# Patient Record
Sex: Female | Born: 1935 | Race: White | Hispanic: No | Marital: Single | Smoking: Never smoker
Health system: Southern US, Community
[De-identification: ages and names within clinical notes are randomized; demographics above are authoritative.]

## PROBLEM LIST (undated history)

## (undated) DIAGNOSIS — G4710 Hypersomnia, unspecified: Secondary | ICD-10-CM

## (undated) DIAGNOSIS — I5042 Chronic combined systolic (congestive) and diastolic (congestive) heart failure: Secondary | ICD-10-CM

## (undated) DIAGNOSIS — I10 Essential (primary) hypertension: Secondary | ICD-10-CM

## (undated) DIAGNOSIS — I255 Ischemic cardiomyopathy: Secondary | ICD-10-CM

## (undated) DIAGNOSIS — N183 Chronic kidney disease, stage 3 unspecified: Secondary | ICD-10-CM

## (undated) DIAGNOSIS — I5020 Unspecified systolic (congestive) heart failure: Secondary | ICD-10-CM

## (undated) DIAGNOSIS — H8110 Benign paroxysmal vertigo, unspecified ear: Secondary | ICD-10-CM

## (undated) DIAGNOSIS — N3281 Overactive bladder: Secondary | ICD-10-CM

## (undated) DIAGNOSIS — D519 Vitamin B12 deficiency anemia, unspecified: Secondary | ICD-10-CM

## (undated) DIAGNOSIS — M1990 Unspecified osteoarthritis, unspecified site: Secondary | ICD-10-CM

## (undated) DIAGNOSIS — G4730 Sleep apnea, unspecified: Secondary | ICD-10-CM

## (undated) DIAGNOSIS — I2510 Atherosclerotic heart disease of native coronary artery without angina pectoris: Secondary | ICD-10-CM

## (undated) DIAGNOSIS — H3522 Other non-diabetic proliferative retinopathy, left eye: Secondary | ICD-10-CM

## (undated) DIAGNOSIS — E785 Hyperlipidemia, unspecified: Secondary | ICD-10-CM

## (undated) DIAGNOSIS — E039 Hypothyroidism, unspecified: Secondary | ICD-10-CM

## (undated) DIAGNOSIS — M4800 Spinal stenosis, site unspecified: Secondary | ICD-10-CM

## (undated) DIAGNOSIS — E559 Vitamin D deficiency, unspecified: Secondary | ICD-10-CM

## (undated) DIAGNOSIS — K219 Gastro-esophageal reflux disease without esophagitis: Secondary | ICD-10-CM

## (undated) HISTORY — PX: STAPEDECTOMY: SHX2435

## (undated) HISTORY — PX: CATARACT EXTRACTION, BILATERAL: SHX1313

## (undated) HISTORY — DX: Chronic combined systolic (congestive) and diastolic (congestive) heart failure: I50.42

## (undated) HISTORY — DX: Chronic kidney disease, stage 3 unspecified: N18.30

## (undated) HISTORY — PX: COLON SURGERY: SHX602

## (undated) HISTORY — PX: JOINT REPLACEMENT: SHX530

## (undated) HISTORY — DX: Ischemic cardiomyopathy: I25.5

## (undated) HISTORY — PX: COLECTOMY: SHX59

## (undated) HISTORY — PX: LUMBAR DISC SURGERY: SHX700

## (undated) HISTORY — PX: OTHER SURGICAL HISTORY: SHX169

## (undated) HISTORY — PX: COLONOSCOPY: SHX174

## (undated) HISTORY — DX: Chronic kidney disease, stage 3 (moderate): N18.3

## (undated) HISTORY — DX: Atherosclerotic heart disease of native coronary artery without angina pectoris: I25.10

---

## 2009-12-20 ENCOUNTER — Emergency Department (HOSPITAL_BASED_OUTPATIENT_CLINIC_OR_DEPARTMENT_OTHER): Admission: EM | Admit: 2009-12-20 | Discharge: 2009-12-20 | Payer: Self-pay | Admitting: Emergency Medicine

## 2010-04-07 LAB — MAGNESIUM: Magnesium: 2.1 mg/dL (ref 1.5–2.5)

## 2010-04-07 LAB — DIFFERENTIAL
Eosinophils Absolute: 0.2 10*3/uL (ref 0.0–0.7)
Lymphocytes Relative: 36 % (ref 12–46)
Lymphs Abs: 2.2 10*3/uL (ref 0.7–4.0)
Neutro Abs: 3.1 10*3/uL (ref 1.7–7.7)
Neutrophils Relative %: 52 % (ref 43–77)

## 2010-04-07 LAB — COMPREHENSIVE METABOLIC PANEL
ALT: 27 U/L (ref 0–35)
BUN: 24 mg/dL — ABNORMAL HIGH (ref 6–23)
CO2: 27 mEq/L (ref 19–32)
Calcium: 9.6 mg/dL (ref 8.4–10.5)
Creatinine, Ser: 0.8 mg/dL (ref 0.4–1.2)
GFR calc non Af Amer: >60 mL/min (ref >60)
Glucose, Bld: 86 mg/dL (ref 70–99)

## 2010-04-07 LAB — URINALYSIS, ROUTINE W REFLEX MICROSCOPIC
Glucose, UA: NEGATIVE mg/dL
Protein, ur: NEGATIVE mg/dL
Specific Gravity, Urine: 1.027 (ref 1.005–1.030)
Urobilinogen, UA: 0.2 mg/dL (ref 0.0–1.0)

## 2010-04-07 LAB — CBC
HCT: 36.5 % (ref 36.0–46.0)
Hemoglobin: 12.6 g/dL (ref 12.0–15.0)
MCH: 32.2 pg (ref 26.0–34.0)
MCHC: 34.6 g/dL (ref 30.0–36.0)
RDW: 13 % (ref 11.5–15.5)

## 2010-04-07 LAB — URINE MICROSCOPIC-ADD ON

## 2010-04-07 LAB — TSH: TSH: 2.283 u[IU]/mL (ref 0.350–4.500)

## 2010-12-25 ENCOUNTER — Telehealth: Payer: Self-pay

## 2010-12-25 NOTE — Telephone Encounter (Signed)
Called and spoke with pt, Dr. Macario Carls.  She states she would like to set up an allergy consult with Dr. Maple Hudson ASAP.  She states she has had "allergies all her life."  Has previously seen Dr. Jordan Likes at the clinic in [place].  Pt is wanting to know two things before setting up appt:  1) do we do "end point testing"  2) can she self administer her injections at home since she lives in [place].    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  CY, please advise.   Thank you.

## 2010-12-25 NOTE — Telephone Encounter (Signed)
Per CY-NOT "end point testing technique" and NO to self admin.

## 2010-12-25 NOTE — Telephone Encounter (Signed)
Spoke with patient-she lives at [facility]-they have a round the clock nurse and was wondering if they assumed responsibility to administer allergy injections if [facility] would agree to allow this. Bailey Duke states if they are willing to do this then this is an option. I would have to call the care center on Monday and find out from a manager of the center how this would work; then call the patient back asap.

## 2011-01-01 ENCOUNTER — Ambulatory Visit (INDEPENDENT_AMBULATORY_CARE_PROVIDER_SITE_OTHER): Payer: Medicare Other | Admitting: Internal Medicine

## 2011-01-01 ENCOUNTER — Other Ambulatory Visit: Payer: Medicare Other

## 2011-01-01 ENCOUNTER — Encounter: Payer: Self-pay | Admitting: Internal Medicine

## 2011-01-01 VITALS — BP 152/72 | HR 94 | Ht 64.0 in | Wt 184.0 lb

## 2011-01-01 DIAGNOSIS — J309 Allergic rhinitis, unspecified: Secondary | ICD-10-CM

## 2011-01-01 DIAGNOSIS — H1045 Other chronic allergic conjunctivitis: Secondary | ICD-10-CM

## 2011-01-01 DIAGNOSIS — J301 Allergic rhinitis due to pollen: Secondary | ICD-10-CM

## 2011-01-01 DIAGNOSIS — E039 Hypothyroidism, unspecified: Secondary | ICD-10-CM

## 2011-01-01 DIAGNOSIS — G4733 Obstructive sleep apnea (adult) (pediatric): Secondary | ICD-10-CM

## 2011-01-01 DIAGNOSIS — H1010 Acute atopic conjunctivitis, unspecified eye: Secondary | ICD-10-CM

## 2011-01-01 NOTE — Patient Instructions (Signed)
Consider the techniques for rinsing your nasal passages with saline as needed  Order- lab- IgE allergy profile    Dx allergic rhinitis  Schedule return for allergy skin testing. Antihistamines directly block these tests, so you will need to be off Xyzal and any otc cough or cold preparations, otc sleep meds etc, that often contain antihistamines, FOR AT LEAST 3 DAYS

## 2011-01-01 NOTE — Progress Notes (Signed)
01/01/11- 46 yoF never smoker who comes at the recommendation of friends seeking allergy evaluation. She has been a Holiday representative, never smoked. Began allergy vaccine at age 77 because she was having swelling around her eyes, while living in [place]. She has been on allergy shots much of her adult life and feels she should restart. She has lived in [place] for a number of years now. Recognize triggers include dust, smoke, fragrance, mold, cat and dog. She associates these exposures with repeated colds. In the summer and fall her eyes water. 3 episodes of laryngitis recently. Years ago she says she was cured of asthma by prayer. Her allergy vaccine was stopped by her allergist 5 years ago. Within a year she had developed laryngitis. She had gone to an alternative he'll or who tried sublingual vaccine which didn't seem to help. Complains of a feeling of mucus in her throat and seasonal lacrimation. Denies significant itching or wheezing, rash or nodes. 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 Diagnosed with sleep apnea with CPAP prescribed but not used regularly.  ROS-see HPI Constitutional:   No-   weight loss, night sweats, fevers, chills, fatigue, lassitude. HEENT:   No-  headaches, difficulty swallowing, tooth/dental problems, sore throat,       +  sneezing, itching, ear ache, nasal congestion, post nasal drip,  CV:  No-   chest pain, orthopnea, PND, swelling in lower extremities, anasarca,                                  dizziness, palpitations Resp: No-   shortness of breath with exertion or at rest.              No-   productive cough,  No non-productive cough,  No- coughing up of blood.              No-   change in color of mucus.  No- wheezing.   Skin: +  rash on wrist. GI:  No-   heartburn, indigestion, abdominal pain, nausea, vomiting, diarrhea,                 change in bowel habits, loss of appetite GU: No-   dysuria, change in color of urine, no urgency or  frequency.  No- flank pain. MS:  No-   joint pain or swelling.  No- decreased range of motion.  No- back pain. Neuro-     nothing unusual Psych:  No- change in mood or affect. No depression or anxiety.  No memory loss.  OBJ General- Alert, Oriented, Affect-appropriate, Distress- none acute, jovial Skin- rash-none, lesions- none, excoriation- none Lymphadenopathy- none Head- atraumatic            Eyes- Gross vision intact, PERRLA, conjunctivae clear secretions            Ears- Hearing, canals-normal            Nose- crusting, no-Septal dev, mucus, polyps, erosion, perforation             Throat- Mallampati III, mucosa clear , drainage- none, tonsils- atrophic Neck- flexible , trachea midline, no stridor , thyroid nl, carotid no bruit Chest - symmetrical excursion , unlabored           Heart/CV- RRR , no murmur , no gallop  , no rub, nl s1 s2                           -  JVD- none , edema- none, stasis changes- none, varices- none           Lung- clear to P&A, wheeze- none, cough- none , dullness-none, rub- none           Chest wall-  Abd- tender-no, distended-no, bowel sounds-present, HSM- no Br/ Gen/ Rectal- Not done, not indicated Extrem- cyanosis- none, clubbing, none, atrophy- none, strength- nl Neuro- grossly intact to observation

## 2011-01-01 NOTE — Telephone Encounter (Signed)
I spoke with patient on 12-31-10; made appt for 01-01-11 to see CY as [place] nurse [facility]) will assume responsibility of giving allergy injection. Her fax number is [phone] and phone is [phone]. CY is aware of this. Pt is scheduled for ALT in Feb. Nurse is at [facility] from M-F 9am to 3pm.

## 2011-01-04 DIAGNOSIS — J301 Allergic rhinitis due to pollen: Secondary | ICD-10-CM | POA: Insufficient documentation

## 2011-01-04 DIAGNOSIS — H1010 Acute atopic conjunctivitis, unspecified eye: Secondary | ICD-10-CM | POA: Insufficient documentation

## 2011-01-04 DIAGNOSIS — E039 Hypothyroidism, unspecified: Secondary | ICD-10-CM | POA: Insufficient documentation

## 2011-01-04 DIAGNOSIS — G4733 Obstructive sleep apnea (adult) (pediatric): Secondary | ICD-10-CM | POA: Insufficient documentation

## 2011-01-04 LAB — ALLERGY FULL PROFILE
Allergen, D pternoyssinus,d7: <0.1 kU/L (ref <0.35)
Alternaria Alternata: <0.1 kU/L (ref <0.35)
Bahia Grass: <0.1 kU/L (ref <0.35)
Box Elder IgE: <0.1 kU/L (ref <0.35)
Cat Dander: <0.1 kU/L (ref <0.35)
Common Ragweed: <0.1 kU/L (ref <0.35)
Curvularia lunata: <0.1 kU/L (ref <0.35)
Elm IgE: <0.1 kU/L (ref <0.35)
Fescue: <0.1 kU/L (ref <0.35)
G009 Red Top: <0.1 kU/L (ref <0.35)
House Dust Hollister: <0.1 kU/L (ref <0.35)
Lamb's Quarters: <0.1 kU/L (ref <0.35)
Sycamore Tree: <0.1 kU/L (ref <0.35)

## 2011-01-04 NOTE — Assessment & Plan Note (Addendum)
Long history of allergy vaccine, implying she has had positive skin testing at times. Her symptoms are incompletely defined now but suggest possibility of irritant rhinitis and viral infections. Chest the both in vitro and the skin testing assessments be done. We discussed where they're similar and where they are not.

## 2011-01-04 NOTE — Assessment & Plan Note (Signed)
Diagnosed sleep apnea and has home CPAP but does not seem to be using regularly. I educated her on goals of effective use.

## 2011-01-08 ENCOUNTER — Telehealth: Payer: Self-pay | Admitting: Internal Medicine

## 2011-01-08 NOTE — Telephone Encounter (Signed)
Called and spoke with pt and she is aware of CY recs and the names of the doctors and number.

## 2011-01-08 NOTE — Telephone Encounter (Signed)
Called and spoke with pt and i did review her lab results with her.  Pt voiced her understanding of this.  Pt is requesting the name of the first ophthalmologist that CY recs at the OV yesterday (she does remember Dr. Dione Booze but Zack Seal told her the name of the doctor he see's) and she can not remember the other name. .  She is also requesting if CY knows of a good otolaryngologist that she can call to get an appt with .  CY please advise

## 2011-01-08 NOTE — Telephone Encounter (Signed)
Per CY-OPTH-Hecker or Ashley Royalty and ENT-Wolicki or one in his group.

## 2011-03-03 ENCOUNTER — Ambulatory Visit: Payer: Medicare Other | Admitting: Internal Medicine

## 2013-01-24 ENCOUNTER — Emergency Department (HOSPITAL_COMMUNITY): Payer: Medicare Other

## 2013-01-24 ENCOUNTER — Encounter (HOSPITAL_COMMUNITY): Payer: Self-pay | Admitting: Emergency Medicine

## 2013-01-24 ENCOUNTER — Observation Stay (HOSPITAL_COMMUNITY)
Admission: EM | Admit: 2013-01-24 | Discharge: 2013-01-27 | Disposition: A | Discharge status: Skilled Nursing Facility | Payer: Medicare Other | Attending: Family Medicine | Admitting: Family Medicine

## 2013-01-24 DIAGNOSIS — Z96659 Presence of unspecified artificial knee joint: Secondary | ICD-10-CM | POA: Insufficient documentation

## 2013-01-24 DIAGNOSIS — M47817 Spondylosis without myelopathy or radiculopathy, lumbosacral region: Secondary | ICD-10-CM | POA: Insufficient documentation

## 2013-01-24 DIAGNOSIS — I10 Essential (primary) hypertension: Secondary | ICD-10-CM | POA: Diagnosis present

## 2013-01-24 DIAGNOSIS — Z7982 Long term (current) use of aspirin: Secondary | ICD-10-CM | POA: Insufficient documentation

## 2013-01-24 DIAGNOSIS — F411 Generalized anxiety disorder: Secondary | ICD-10-CM | POA: Insufficient documentation

## 2013-01-24 DIAGNOSIS — H1010 Acute atopic conjunctivitis, unspecified eye: Secondary | ICD-10-CM | POA: Diagnosis present

## 2013-01-24 DIAGNOSIS — K449 Diaphragmatic hernia without obstruction or gangrene: Secondary | ICD-10-CM | POA: Insufficient documentation

## 2013-01-24 DIAGNOSIS — E039 Hypothyroidism, unspecified: Secondary | ICD-10-CM | POA: Diagnosis present

## 2013-01-24 DIAGNOSIS — F0390 Unspecified dementia without behavioral disturbance: Secondary | ICD-10-CM | POA: Insufficient documentation

## 2013-01-24 DIAGNOSIS — F3289 Other specified depressive episodes: Secondary | ICD-10-CM | POA: Insufficient documentation

## 2013-01-24 DIAGNOSIS — F32A Depression, unspecified: Secondary | ICD-10-CM | POA: Diagnosis present

## 2013-01-24 DIAGNOSIS — G459 Transient cerebral ischemic attack, unspecified: Principal | ICD-10-CM | POA: Diagnosis present

## 2013-01-24 DIAGNOSIS — F329 Major depressive disorder, single episode, unspecified: Secondary | ICD-10-CM

## 2013-01-24 DIAGNOSIS — F419 Anxiety disorder, unspecified: Secondary | ICD-10-CM | POA: Diagnosis present

## 2013-01-24 DIAGNOSIS — G4733 Obstructive sleep apnea (adult) (pediatric): Secondary | ICD-10-CM | POA: Diagnosis present

## 2013-01-24 DIAGNOSIS — K219 Gastro-esophageal reflux disease without esophagitis: Secondary | ICD-10-CM | POA: Diagnosis present

## 2013-01-24 DIAGNOSIS — J301 Allergic rhinitis due to pollen: Secondary | ICD-10-CM | POA: Diagnosis present

## 2013-01-24 DIAGNOSIS — M4800 Spinal stenosis, site unspecified: Secondary | ICD-10-CM | POA: Insufficient documentation

## 2013-01-24 DIAGNOSIS — Z79899 Other long term (current) drug therapy: Secondary | ICD-10-CM | POA: Insufficient documentation

## 2013-01-24 DIAGNOSIS — H1045 Other chronic allergic conjunctivitis: Secondary | ICD-10-CM | POA: Insufficient documentation

## 2013-01-24 DIAGNOSIS — M25569 Pain in unspecified knee: Secondary | ICD-10-CM | POA: Insufficient documentation

## 2013-01-24 HISTORY — DX: Gastro-esophageal reflux disease without esophagitis: K21.9

## 2013-01-24 HISTORY — DX: Spinal stenosis, site unspecified: M48.00

## 2013-01-24 HISTORY — DX: Sleep apnea, unspecified: G47.30

## 2013-01-24 HISTORY — DX: Essential (primary) hypertension: I10

## 2013-01-24 LAB — COMPREHENSIVE METABOLIC PANEL
ALT: 16 U/L (ref 0–35)
Alkaline Phosphatase: 90 U/L (ref 39–117)
CO2: 26 mEq/L (ref 19–32)
GFR calc Af Amer: 54 mL/min — ABNORMAL LOW (ref >90)
GFR calc non Af Amer: 47 mL/min — ABNORMAL LOW (ref >90)
Glucose, Bld: 106 mg/dL — ABNORMAL HIGH (ref 70–99)
Potassium: 4.5 mEq/L (ref 3.7–5.3)
Sodium: 140 mEq/L (ref 137–147)

## 2013-01-24 LAB — URINALYSIS, ROUTINE W REFLEX MICROSCOPIC
Glucose, UA: NEGATIVE mg/dL
Hgb urine dipstick: NEGATIVE
Specific Gravity, Urine: 1.016 (ref 1.005–1.030)

## 2013-01-24 LAB — GLUCOSE, CAPILLARY: Glucose-Capillary: 95 mg/dL (ref 70–99)

## 2013-01-24 LAB — URINE MICROSCOPIC-ADD ON

## 2013-01-24 LAB — CBC
HCT: 36.7 % (ref 36.0–46.0)
MCHC: 33 g/dL (ref 30.0–36.0)
RDW: 13.6 % (ref 11.5–15.5)

## 2013-01-24 MED ORDER — ASPIRIN 325 MG PO TABS
325.0000 mg | ORAL_TABLET | Freq: Once | ORAL | Status: AC
  Administered 2013-01-24: 325 mg via ORAL
  Filled 2013-01-24: qty 1

## 2013-01-24 NOTE — ED Notes (Signed)
Admitting MD at bedside, MD reynolds at bedside.

## 2013-01-24 NOTE — ED Notes (Addendum)
Spoke with Leavy Cella, Charity fundraiser at [facility]. She reports that pt lives in independent living at [facility] but was placed in the rehab facility 1 month ago due to fall, head injury and altered mental status. Pt was discharged from rehab 1 week ago and told to follow up with a neurologist for MRI. Pt did not have MRI completed. Pt was brought back to rehab facility last night (possibly by in-home caregivers) due to being "out of it" and decreased expression. Today pt's mental status improved but when pt was seen by staff at rehab (possibly MD or NP) orders were placed for pt to leave EMS for MRI.   [facility] RN phone number [phone].

## 2013-01-24 NOTE — ED Notes (Signed)
Per EMS pt came from [facility] assisted living facility for altered mental status. Pt has hx of dementia and confusion has increased over the past 3 months per staff. Pt's doctor recommended she be seen for MRI and pt was sent here. Pt AAOx4. VSS.

## 2013-01-24 NOTE — ED Provider Notes (Signed)
CSN: [id number]     Arrival date & time 01/24/13  1830 History   First MD Initiated Contact with Patient 01/24/13 1834     Chief Complaint  Patient presents with  . Altered Mental Status    HPI Patient reports word finding difficulty yesterday.  She states it was very severe.  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  She denies weakness of her arms or legs.  She did feels that her speech was possibly a little thickened.  She denies facial droop.  No prior history of stroke.  She does have a history of hypertension.  Symptoms were mild to moderate in severity.  She states that lasted for several hours yesterday.  She states that her word finding difficulties or significantly improved at this point she feels 95% back to normal.  No chest pain or shortness of breath    Past Medical History  Diagnosis Date  . Hypertension   . Sleep apnea   . Spinal stenosis   . Depressive disorder   . Anxiety   . Diaphragmatic hernia   . Dementia   . GERD (gastroesophageal reflux disease)    Past Surgical History  Procedure Laterality Date  . Joint replacement     No family history on file. History  Substance Use Topics  . Smoking status: Never Smoker   . Smokeless tobacco: Not on file  . Alcohol Use: 1.2 oz/week    2 Shots of liquor per week   OB History   Grav Para Term Preterm Abortions TAB SAB Ect Mult Living                 Review of Systems  All other systems reviewed and are negative.    Allergies  Penicillins and Sulfa antibiotics  Home Medications   Current Outpatient Rx  Name  Route  Sig  Dispense  Refill  . acetaminophen (TYLENOL) 325 MG tablet   Oral   Take 650 mg by mouth every 4 (four) hours as needed for mild pain or fever.         Marland Kitchen amLODipine (NORVASC) 5 MG tablet   Oral   Take 5 mg by mouth every morning.         . Armodafinil (NUVIGIL) 250 MG tablet   Oral   Take 250 mg by mouth every morning.         .  camphor-menthol (SARNA) lotion   Topical   Apply 1 application topically 2 (two) times daily as needed for itching.         . carvedilol (COREG) 6.25 MG tablet   Oral   Take 6.25 mg by mouth 2 (two) times daily with a meal.         . cetirizine (ZYRTEC) 10 MG tablet   Oral   Take 10 mg by mouth daily as needed for allergies or rhinitis.         . Cholecalciferol (VITAMIN D) 2000 UNITS CAPS   Oral   Take 2,000 Units by mouth every morning.         . cyanocobalamin (,VITAMIN B-12,) 1000 MCG/ML injection   Intramuscular   Inject 1,000 mcg into the muscle every 30 (thirty) days.         . diclofenac sodium (VOLTAREN) 1 % GEL   Topical   Apply 4 g topically every 6 (six) hours as needed (for lumbosacral spondylosis).         Marland Kitchen  HYDROcodone-acetaminophen (NORCO/VICODIN) 5-325 MG per tablet   Oral   Take 1 tablet by mouth every 6 (six) hours as needed for moderate pain.         Marland Kitchen levothyroxine (SYNTHROID, LEVOTHROID) 100 MCG tablet   Oral   Take 100 mcg by mouth daily before breakfast.         . Liniments (PENETRAN PLUS EX)   Apply externally   Apply 1 application topically 3 (three) times daily as needed (for joint pain).         Marland Kitchen losartan (COZAAR) 100 MG tablet   Oral   Take 100 mg by mouth every morning.         . magnesium oxide (MAG-OX) 400 MG tablet   Oral   Take 400 mg by mouth at bedtime.         . meclizine (ANTIVERT) 25 MG tablet   Oral   Take 25 mg by mouth 3 (three) times daily as needed for dizziness.         . Menthol, Topical Analgesic, (BIOFREEZE) 4 % GEL   Apply externally   Apply 1 application topically 3 (three) times daily as needed (for joint pain).         . Multiple Vitamins-Minerals (MULTIVITAMIN PO)   Oral   Take 1 tablet by mouth every morning.         Marland Kitchen omeprazole (PRILOSEC) 20 MG capsule   Oral   Take 20 mg by mouth daily.         Marland Kitchen OVER THE COUNTER MEDICATION   Oral   Take 30 mLs by mouth at bedtime.  *alcohol liquid*         . oxybutynin (DITROPAN-XL) 5 MG 24 hr tablet   Oral   Take 5 mg by mouth at bedtime.         . Soybean Lecithin (PHOSPHATIDYLSERINE) 40 % POWD   Oral   Take 500 mg by mouth 3 (three) times daily.         . traMADol (ULTRAM) 50 MG tablet   Oral   Take 50 mg by mouth 2 (two) times daily.         . Turmeric, Curcuma Longa, (CURCUMIN EXTRACT) POWD   Does not apply   1 application by Does not apply route 3 (three) times daily.         . vitamin C (ASCORBIC ACID) 500 MG tablet   Oral   Take 1,000 mg by mouth every morning.          BP 180/77  Temp(Src) 98 F (36.7 C) (Oral)  Resp 16  SpO2 96% Physical Exam  Nursing note and vitals reviewed. Constitutional: She is oriented to person, place, and time. She appears well-developed and well-nourished. No distress.  HENT:  Head: Normocephalic and atraumatic.  Eyes: EOM are normal. Pupils are equal, round, and reactive to light.  Neck: Normal range of motion.  Cardiovascular: Normal rate, regular rhythm and normal heart sounds.   Pulmonary/Chest: Effort normal and breath sounds normal.  Abdominal: Soft. She exhibits no distension. There is no tenderness.  Musculoskeletal: Normal range of motion.  Neurological: She is alert and oriented to person, place, and time.  5/5 strength in major muscle groups of  bilateral upper and lower extremities. Speech normal. No facial asymetry.   Skin: Skin is warm and dry.  Psychiatric: She has a normal mood and affect. Judgment normal.    ED Course  Procedures (including critical care time)  Labs Review Labs Reviewed  URINALYSIS, ROUTINE W REFLEX MICROSCOPIC - Abnormal; Notable for the following:    APPearance HAZY (*)    Leukocytes, UA SMALL (*)    All other components within normal limits  COMPREHENSIVE METABOLIC PANEL - Abnormal; Notable for the following:    Glucose, Bld 106 (*)    BUN 26 (*)    Albumin 3.4 (*)    Total Bilirubin <0.2 (*)    GFR calc  non Af Amer 47 (*)    GFR calc Af Amer 54 (*)    All other components within normal limits  URINE MICROSCOPIC-ADD ON - Abnormal; Notable for the following:    Bacteria, UA FEW (*)    All other components within normal limits  URINE CULTURE  CBC  GLUCOSE, CAPILLARY   Imaging Review Ct Head Wo Contrast  01/24/2013   CLINICAL DATA:  Altered mental status. Dementia. Worsening confusion.  EXAM: CT HEAD WITHOUT CONTRAST  TECHNIQUE: Contiguous axial images were obtained from the base of the skull through the vertex without intravenous contrast.  COMPARISON:  02/24/2012 MRI report only  FINDINGS: Sinuses/Soft tissues: Clear paranasal sinuses and mastoid air cells.  Intracranial: No mass lesion, hemorrhage, hydrocephalus, acute infarct, intra-axial, or extra-axial fluid collection. Mild-to-moderate low density in the periventricular white matter likely related to small vessel disease.  IMPRESSION: No acute intracranial abnormality.  Small vessel ischemic change.   Electronically Signed   By: Jeronimo Greaves M.D.   On: 01/24/2013 21:13  I personally reviewed the imaging tests through PACS system I reviewed available ER/hospitalization records through the EMR   EKG Interpretation   None       MDM   1. TIA (transient ischemic attack)    Concerning for TIA or quickly resolving stroke.  Swallow study.  Aspirin.  Head CT without abnormality.  Patient is feeling better.  Neurology consultation.  Hospitalist admission.  I think she'll benefit from TIA/stroke workup including MRI    Lyanne Co, MD 01/24/13 2304

## 2013-01-24 NOTE — Consult Note (Signed)
Referring Physician: Patria Mane    Chief Complaint: Difficulty with speech  HPI: Bailey Duke is an 79 y.o. female that reports that she was at baseline until yesterday around lunchtime.  She went out to speak to some workers in the yard. Initially she was able to speak well but as she got in to the conversation she was unable to speak fluently.  She was able to finish the conversation to some degree and came in to lie down at that time.  When she awakened she went to dinner and seemed to do well speaking at that time.  When she awakened this morning it seems that she was continuing to have some problems but improved from yesterday.  She was convinced by others to come to the hospital. At baseline patient drives and takes care of her own bills.  She does not cook or clean and has a Diplomatic Services operational officer that comes in 5 days a week.    Date last known well: Date: 01/23/2013 Time last known well: Time: 12:00 tPA Given: No: Outside time window  Past Medical History  Diagnosis Date  . Hypertension   . Sleep apnea   . Spinal stenosis   . Depressive disorder   . Anxiety   . Diaphragmatic hernia   . Dementia   . GERD (gastroesophageal reflux disease)     Past Surgical History  Procedure Laterality Date  . Joint replacement      Family History  Problem Relation Age of Onset  . Breast cancer Mother   . Other Father     Hardening of the arteries to the head   Social History:  reports that she has never smoked. She does not have any smokeless tobacco history on file. She reports that she drinks about 1.2 ounces of alcohol per week. She reports that she does not use illicit drugs.  Allergies:  Allergies  Allergen Reactions  . Penicillins   . Sulfa Antibiotics     Medications: I have reviewed the patient's current medications. Prior to Admission:  Current outpatient prescriptions: acetaminophen (TYLENOL) 325 MG tablet, Take 650 mg by mouth every 4 (four) hours as needed for mild pain or fever., Disp:  , Rfl: amLODipine (NORVASC) 5 MG tablet, Take 5 mg by mouth every morning., Disp: , Rfl: ;   Armodafinil (NUVIGIL) 250 MG tablet, Take 250 mg by mouth every morning., Disp: , Rfl:  camphor-menthol (SARNA) lotion, Apply 1 application topically 2 (two) times daily as needed for itching., Disp: , Rfl: ;   carvedilol (COREG) 6.25 MG tablet, Take 6.25 mg by mouth 2 (two) times daily with a meal., Disp: , Rfl: ;   cetirizine (ZYRTEC) 10 MG tablet, Take 10 mg by mouth daily as needed for allergies or rhinitis., Disp: , Rfl: ;   Cholecalciferol (VITAMIN D) 2000 UNITS CAPS, Take 2,000 Units by mouth every morning., Disp: , Rfl:  cyanocobalamin (,VITAMIN B-12,) 1000 MCG/ML injection, Inject 1,000 mcg into the muscle every 30 (thirty) days., Disp: , Rfl: ;  diclofenac sodium (VOLTAREN) 1 % GEL, Apply 4 g topically every 6 (six) hours as needed (for lumbosacral spondylosis)., Disp: , HYDROcodone-acetaminophen (NORCO/VICODIN) 5-325 MG per tablet, Take 1 tablet by mouth every 6 (six) hours as needed for moderate pain., Disp: , Rfl:  levothyroxine (SYNTHROID, LEVOTHROID) 100 MCG tablet, Take 100 mcg by mouth daily before breakfast., Disp: , Rfl: ;  Liniments (PENETRAN PLUS EX), Apply 1 application topically 3 (three) times daily as needed (for joint pain)., Disp: , Rfl: ;  losartan (COZAAR) 100 MG tablet, Take 100 mg by mouth every morning., Disp: , Rfl: ;   magnesium oxide (MAG-OX) 400 MG tablet, Take 400 mg by mouth at bedtime., Disp: , Rfl:  meclizine (ANTIVERT) 25 MG tablet, Take 25 mg by mouth 3 (three) times daily as needed for dizziness., Disp: , Rfl: ;   Menthol, Topical Analgesic, (BIOFREEZE) 4 % GEL, Apply 1 application topically 3 (three) times daily as needed (for joint pain)., Multiple Vitamins-Minerals (MULTIVITAMIN PO), Take 1 tablet by mouth every morning., Disp: , Rfl: ;   omeprazole (PRILOSEC) 20 MG capsule, Take 20 mg by mouth daily., Disp: , Rfl:  OVER THE COUNTER MEDICATION, Take 30 mLs by mouth  at bedtime. *alcohol liquid*, Disp: , Rfl: ;   oxybutynin (DITROPAN-XL) 5 MG 24 hr tablet, Take 5 mg by mouth at bedtime., Disp: , Rfl: ;   Soybean Lecithin (PHOSPHATIDYLSERINE) 40 % POWD, Take 500 mg by mouth 3 (three) times daily., Disp: , Rfl: ;   traMADol (ULTRAM) 50 MG tablet, Take 50 mg by mouth 2 (two) times daily., Disp: , Rfl:  Turmeric, Curcuma Longa, (CURCUMIN EXTRACT) POWD, 1 application by Does not apply route 3 (three) times daily., Disp: , Rfl: vitamin C (ASCORBIC ACID) 500 MG tablet, Take 1,000 mg by mouth every morning., Disp: , Rfl:   ROS: History obtained from the patient  General ROS: negative for - chills, fatigue, fever, night sweats, weight gain or weight loss Psychological ROS: negative for - behavioral disorder, hallucinations, memory difficulties, mood swings or suicidal ideation Ophthalmic ROS: negative for - blurry vision, double vision, eye pain or loss of vision ENT ROS: negative for - epistaxis, nasal discharge, oral lesions, sore throat, tinnitus or vertigo Allergy and Immunology ROS: negative for - hives or itchy/watery eyes Hematological and Lymphatic ROS: negative for - bleeding problems, bruising or swollen lymph nodes Endocrine ROS: negative for - galactorrhea, hair pattern changes, polydipsia/polyuria or temperature intolerance Respiratory ROS: shortness of breath Cardiovascular ROS: negative for - chest pain, dyspnea on exertion, edema or irregular heartbeat Gastrointestinal ROS: recent diarrhea Genito-Urinary ROS: negative for - dysuria, hematuria, incontinence or urinary frequency/urgency Musculoskeletal ROS: negative for - joint swelling or muscular weakness Neurological ROS: as noted in HPI Dermatological ROS: negative for rash and skin lesion changes  Physical Examination: Blood pressure 171/93, pulse 86, temperature 98 F (36.7 C), temperature source Oral, resp. rate 16, SpO2 93.00%.  Neurologic Examination: Mental Status: Alert, oriented,  thought content appropriate.  Speech fluent but at times patient would just stop talking and lose track of what she was speaking of.  Reports she was unable to think of words to say.  Would then restart conversation.  Tangential.  Able to follow 3 step commands without difficulty. Cranial Nerves: II: Discs flat bilaterally; Visual fields grossly normal.  Left pupil 2mm, right pupil 55mm-both unreactive. III,IV, VI: left ptosis, extra-ocular motions intact bilaterally V,VII: smile symmetric, facial light touch sensation normal bilaterally VIII: hearing normal bilaterally IX,X: gag reflex present XI: bilateral shoulder shrug XII: midline tongue extension Motor: Right : Upper extremity   5/5    Left:     Upper extremity   5/5  Lower extremity   5/5     Lower extremity   4/5 Tone and bulk:normal tone throughout; no atrophy noted Sensory: Pinprick and light touch intact throughout, bilaterally Deep Tendon Reflexes: 1+ in the upper extremities, knees not tested due to surgeries, absent AJ's bilaterally.   Plantars: Right: downgoing   Left:  downgoing Cerebellar: normal finger-to-nose, normal rapid alternating movements and normal heel-to-shin test Gait: Unable to test CV: pulses palpable throughout     Laboratory Studies:  Basic Metabolic Panel:  Recent Labs Lab 01/24/13 2008  NA 140  K 4.5  CL 104  CO2 26  GLUCOSE 106*  BUN 26*  CREATININE 1.06  CALCIUM 9.7    Liver Function Tests:  Recent Labs Lab 01/24/13 2008  AST 23  ALT 16  ALKPHOS 90  BILITOT <0.2*  PROT 6.1  ALBUMIN 3.4*   No results found for this basename: LIPASE, AMYLASE,  in the last 168 hours No results found for this basename: AMMONIA,  in the last 168 hours  CBC:  Recent Labs Lab 01/24/13 2008  WBC 9.6  HGB 12.1  HCT 36.7  MCV 92.9  PLT 224    Cardiac Enzymes: No results found for this basename: CKTOTAL, CKMB, CKMBINDEX, TROPONINI,  in the last 168 hours  BNP: No components found with this  basename: POCBNP,   CBG:  Recent Labs Lab 01/24/13 2018  GLUCAP 95    Microbiology: No results found for this or any previous visit.  Coagulation Studies: No results found for this basename: LABPROT, INR,  in the last 72 hours  Urinalysis:  Recent Labs Lab 01/24/13 1948  COLORURINE YELLOW  LABSPEC 1.016  PHURINE 6.0  GLUCOSEU NEGATIVE  HGBUR NEGATIVE  BILIRUBINUR NEGATIVE  KETONESUR NEGATIVE  PROTEINUR NEGATIVE  UROBILINOGEN 0.2  NITRITE NEGATIVE  LEUKOCYTESUR SMALL*    Lipid Panel: No results found for this basename: chol, trig, hdl, cholhdl, vldl, ldlcalc    HgbA1C:  No results found for this basename: HGBA1C    Urine Drug Screen:   No results found for this basename: labopia, cocainscrnur, labbenz, amphetmu, thcu, labbarb    Alcohol Level: No results found for this basename: ETH,  in the last 168 hours  Other results: EKG: normal sinus rhythm at 79 bpm.  Imaging: Ct Head Wo Contrast  01/24/2013   CLINICAL DATA:  Altered mental status. Dementia. Worsening confusion.  EXAM: CT HEAD WITHOUT CONTRAST  TECHNIQUE: Contiguous axial images were obtained from the base of the skull through the vertex without intravenous contrast.  COMPARISON:  02/24/2012 MRI report only  FINDINGS: Sinuses/Soft tissues: Clear paranasal sinuses and mastoid air cells.  Intracranial: No mass lesion, hemorrhage, hydrocephalus, acute infarct, intra-axial, or extra-axial fluid collection. Mild-to-moderate low density in the periventricular white matter likely related to small vessel disease.  IMPRESSION: No acute intracranial abnormality.  Small vessel ischemic change.   Electronically Signed   By: Jeronimo Greaves M.D.   On: 01/24/2013 21:13    Assessment: 79 y.o. female presenting with difficulty with speech.  Otherwise neurological examination is unremarkable.  Speech is not a typical aphasia.  Patient has a history of some dementia but it is unclear exactly what her baseline is.  This may be  complicating her presentation.  Further work up recommended.     Stroke Risk Factors - hypertension  Plan: 1. HgbA1c, fasting lipid panel 2. MRI, MRA  of the brain without contrast 3. PT consult, OT consult, Speech consult 4. Echocardiogram 5. Carotid dopplers 6. Prophylactic therapy-Antiplatelet med: Aspirin - dose 325mg  daily 7. Risk factor modification 8. Telemetry monitoring 9. Frequent neuro checks 10. B12, TSH, RPR, ESR 11. EEG   Thana Farr, MD [facility] [phone] 01/24/2013, 11:36 PM

## 2013-01-24 NOTE — ED Notes (Signed)
Dr. Reynolds at bedside.

## 2013-01-24 NOTE — H&P (Signed)
Patient's PCP: [name], MD  Chief Complaint: Difficulty speaking  History of Present Illness: Bailey Duke is a 79 y.o. Caucasian female with history of hypertension, obstructive sleep apnea on CPAP, depression, anxiety, GERD, and dementia who presents with the above complaints.  Patient worse tangential, was difficult to reorient.  Patient was in a rehabilitation center for 7 weeks after she had a fall, denies any fractures.  She has been at independent living facility for the last week.  Yesterday afternoon around 12, when men came to work at her garden she indicated that she had difficulty finding words for 10 minutes.  She then went to sleep, reported that she go to dinner and had conversation with somebody without any difficulty.  This morning she woke up and discussed her incident with somebody he instructed the patient to come to the emergency department for further evaluation.  She denies any recent fevers, chills, nausea, vomiting, chest pain, abdominal pain, diarrhea, or vision changes.  reports having shortness of breath for years which is not new.  Hospitalist service was asked to admit the patient.  Patient was seen in consultation with neurology.  Review of Systems: All systems reviewed with the patient and positive as per history of present illness, otherwise all other systems are negative.  Past Medical History  Diagnosis Date  . Hypertension   . Sleep apnea   . Spinal stenosis   . Depressive disorder   . Anxiety   . Diaphragmatic hernia   . Dementia   . GERD (gastroesophageal reflux disease)    Past Surgical History  Procedure Laterality Date  . Joint replacement     Family History  Problem Relation Age of Onset  . Breast cancer Mother   . Other Father     Hardening of the arteries to the head   History   Social History  . Marital Status: Single    Spouse Name: N/A    Number of Children: 0  . Years of Education: N/A   Occupational History  . singer     Social History Main Topics  . Smoking status: Never Smoker   . Smokeless tobacco: Not on file  . Alcohol Use: 1.2 oz/week    2 Shots of liquor per week  . Drug Use: No  . Sexual Activity: Not on file   Other Topics Concern  . Not on file   Social History Narrative  . No narrative on file   Allergies: Penicillins and Sulfa antibiotics  Home Meds: Prior to Admission medications   Medication Sig Start Date End Date Taking? Authorizing Provider  acetaminophen (TYLENOL) 325 MG tablet Take 650 mg by mouth every 4 (four) hours as needed for mild pain or fever.   Yes Historical Provider, MD  amLODipine (NORVASC) 5 MG tablet Take 5 mg by mouth every morning.   Yes Historical Provider, MD  Armodafinil (NUVIGIL) 250 MG tablet Take 250 mg by mouth every morning.   Yes Historical Provider, MD  camphor-menthol Wynelle Fanny) lotion Apply 1 application topically 2 (two) times daily as needed for itching.   Yes Historical Provider, MD  carvedilol (COREG) 6.25 MG tablet Take 6.25 mg by mouth 2 (two) times daily with a meal.   Yes Historical Provider, MD  cetirizine (ZYRTEC) 10 MG tablet Take 10 mg by mouth daily as needed for allergies or rhinitis.   Yes Historical Provider, MD  Cholecalciferol (VITAMIN D) 2000 UNITS CAPS Take 2,000 Units by mouth every morning.   Yes Historical Provider, MD  cyanocobalamin (,VITAMIN B-12,) 1000 MCG/ML injection Inject 1,000 mcg into the muscle every 30 (thirty) days.   Yes Historical Provider, MD  diclofenac sodium (VOLTAREN) 1 % GEL Apply 4 g topically every 6 (six) hours as needed (for lumbosacral spondylosis).   Yes Historical Provider, MD  HYDROcodone-acetaminophen (NORCO/VICODIN) 5-325 MG per tablet Take 1 tablet by mouth every 6 (six) hours as needed for moderate pain.   Yes Historical Provider, MD  levothyroxine (SYNTHROID, LEVOTHROID) 100 MCG tablet Take 100 mcg by mouth daily before breakfast.   Yes Historical Provider, MD  Liniments (PENETRAN PLUS EX) Apply 1  application topically 3 (three) times daily as needed (for joint pain).   Yes Historical Provider, MD  losartan (COZAAR) 100 MG tablet Take 100 mg by mouth every morning.   Yes Historical Provider, MD  magnesium oxide (MAG-OX) 400 MG tablet Take 400 mg by mouth at bedtime.   Yes Historical Provider, MD  meclizine (ANTIVERT) 25 MG tablet Take 25 mg by mouth 3 (three) times daily as needed for dizziness.   Yes Historical Provider, MD  Menthol, Topical Analgesic, (BIOFREEZE) 4 % GEL Apply 1 application topically 3 (three) times daily as needed (for joint pain).   Yes Historical Provider, MD  Multiple Vitamins-Minerals (MULTIVITAMIN PO) Take 1 tablet by mouth every morning.   Yes Historical Provider, MD  omeprazole (PRILOSEC) 20 MG capsule Take 20 mg by mouth daily.   Yes Historical Provider, MD  OVER THE COUNTER MEDICATION Take 30 mLs by mouth at bedtime. *alcohol liquid*   Yes Historical Provider, MD  oxybutynin (DITROPAN-XL) 5 MG 24 hr tablet Take 5 mg by mouth at bedtime.   Yes Historical Provider, MD  Soybean Lecithin (PHOSPHATIDYLSERINE) 40 % POWD Take 500 mg by mouth 3 (three) times daily.   Yes Historical Provider, MD  traMADol (ULTRAM) 50 MG tablet Take 50 mg by mouth 2 (two) times daily.   Yes Historical Provider, MD  Turmeric, Curcuma Longa, (CURCUMIN EXTRACT) POWD 1 application by Does not apply route 3 (three) times daily.   Yes Historical Provider, MD  vitamin C (ASCORBIC ACID) 500 MG tablet Take 1,000 mg by mouth every morning.   Yes Historical Provider, MD    Physical Exam: Blood pressure 179/84, pulse 75, temperature 98 F (36.7 C), temperature source Oral, resp. rate 15, SpO2 99.00%. General: Awake, Oriented x3, No acute distress. HEENT: EOMI, Moist mucous membranes Neck: Supple CV: S1 and S2 Lungs: Clear to ascultation bilaterally Abdomen: Soft, Nontender, Nondistended, +bowel sounds. Ext: Good pulses. Trace edema. No clubbing or cyanosis noted. Neuro: Cranial Nerves II-XII  grossly intact. Has 5/5 motor strength in upper and lower extremities.  Lab results:  Recent Labs  01/24/13 2008  NA 140  K 4.5  CL 104  CO2 26  GLUCOSE 106*  BUN 26*  CREATININE 1.06  CALCIUM 9.7    Recent Labs  01/24/13 2008  AST 23  ALT 16  ALKPHOS 90  BILITOT <0.2*  PROT 6.1  ALBUMIN 3.4*   No results found for this basename: LIPASE, AMYLASE,  in the last 72 hours  Recent Labs  01/24/13 2008  WBC 9.6  HGB 12.1  HCT 36.7  MCV 92.9  PLT 224   No results found for this basename: CKTOTAL, CKMB, CKMBINDEX, TROPONINI,  in the last 72 hours No components found with this basename: POCBNP,  No results found for this basename: DDIMER,  in the last 72 hours No results found for this basename: HGBA1C,  in the  last 72 hours No results found for this basename: CHOL, HDL, LDLCALC, TRIG, CHOLHDL, LDLDIRECT,  in the last 72 hours No results found for this basename: TSH, T4TOTAL, FREET3, T3FREE, THYROIDAB,  in the last 72 hours No results found for this basename: VITAMINB12, FOLATE, FERRITIN, TIBC, IRON, RETICCTPCT,  in the last 72 hours Imaging results:  Ct Head Wo Contrast  01/24/2013   CLINICAL DATA:  Altered mental status. Dementia. Worsening confusion.  EXAM: CT HEAD WITHOUT CONTRAST  TECHNIQUE: Contiguous axial images were obtained from the base of the skull through the vertex without intravenous contrast.  COMPARISON:  02/24/2012 MRI report only  FINDINGS: Sinuses/Soft tissues: Clear paranasal sinuses and mastoid air cells.  Intracranial: No mass lesion, hemorrhage, hydrocephalus, acute infarct, intra-axial, or extra-axial fluid collection. Mild-to-moderate low density in the periventricular white matter likely related to small vessel disease.  IMPRESSION: No acute intracranial abnormality.  Small vessel ischemic change.   Electronically Signed   By: Jeronimo Greaves M.D.   On: 01/24/2013 21:13   Other results: EKG: Sinus rhythm with heart rate of 79.  Assessment & Plan by  Problem: Expressive aphasia?/TIA Admit the patient to telemetry.  Initiate TIA work up with MRI/MRI of the brain, carotid Dopplers, and 2-D echocardiogram.  The patient was initiated on aspirin in the emergency department which will be continued.  Patient was tangential in her thought process, may have underlying dementia, will defer further workup to her primary care physician.  Risk stratify the patient by checking hemoglobin A1c and lipid panel.  Requests PT and OT evaluation the morning.  Hypertension Stable.  Continue home antihypertensive medications.  Not well controlled.  When necessary hydralazine for systolic blood pressure greater than 160.   Obstructive sleep apnea Continue CPAP.  Hypothyroidism Continue levothyroxine.  Depression/anxiety Continue home medications.  Chronic allergy Continue home medications.  GERD Stable.  Prophylaxis Lovenox.  CODE STATUS DO NOT RESUSCITATE/DO NOT INTUBATE.  This was discussed with patient at the time admission. She has GOLD out of facility DNR form from home.  Disposition Admit the patient to telemetry as observation.  Time spent on admission, talking to the patient, and coordinating care was: 50 mins.  Nayson Traweek A, MD 01/24/2013, 11:42 PM

## 2013-01-24 NOTE — ED Notes (Signed)
MD at bedside. 

## 2013-01-25 DIAGNOSIS — G459 Transient cerebral ischemic attack, unspecified: Secondary | ICD-10-CM

## 2013-01-25 DIAGNOSIS — G4733 Obstructive sleep apnea (adult) (pediatric): Secondary | ICD-10-CM

## 2013-01-25 DIAGNOSIS — E039 Hypothyroidism, unspecified: Secondary | ICD-10-CM

## 2013-01-25 DIAGNOSIS — I10 Essential (primary) hypertension: Secondary | ICD-10-CM

## 2013-01-25 LAB — BASIC METABOLIC PANEL
BUN: 20 mg/dL (ref 6–23)
CHLORIDE: 106 meq/L (ref 96–112)
CO2: 20 meq/L (ref 19–32)
CREATININE: 0.84 mg/dL (ref 0.50–1.10)
Calcium: 9.6 mg/dL (ref 8.4–10.5)
GFR calc non Af Amer: 62 mL/min — ABNORMAL LOW (ref >90)
GFR, EST AFRICAN AMERICAN: 71 mL/min — AB (ref >90)
GLUCOSE: 108 mg/dL — AB (ref 70–99)
POTASSIUM: 3.8 meq/L (ref 3.7–5.3)
Sodium: 142 mEq/L (ref 137–147)

## 2013-01-25 LAB — GLUCOSE, CAPILLARY
Glucose-Capillary: 93 mg/dL (ref 70–99)
Glucose-Capillary: 106 mg/dL — ABNORMAL HIGH (ref 70–99)

## 2013-01-25 LAB — LIPID PANEL
CHOL/HDL RATIO: 3.1 ratio
CHOLESTEROL: 211 mg/dL — AB (ref 0–200)
HDL: 69 mg/dL (ref >39)
LDL CALC: 125 mg/dL — AB (ref 0–99)
TRIGLYCERIDES: 86 mg/dL (ref <150)
VLDL: 17 mg/dL (ref 0–40)

## 2013-01-25 LAB — URINE CULTURE: Colony Count: 50000

## 2013-01-25 LAB — RAPID URINE DRUG SCREEN, HOSP PERFORMED
Amphetamines: NOT DETECTED
BARBITURATES: NOT DETECTED
Benzodiazepines: NOT DETECTED
Cocaine: NOT DETECTED
Opiates: NOT DETECTED
Tetrahydrocannabinol: NOT DETECTED

## 2013-01-25 LAB — HEMOGLOBIN A1C
Hgb A1c MFr Bld: 5.8 % — ABNORMAL HIGH (ref <5.7)
Mean Plasma Glucose: 120 mg/dL — ABNORMAL HIGH (ref <117)

## 2013-01-25 LAB — CBC
HEMATOCRIT: 38.5 % (ref 36.0–46.0)
HEMOGLOBIN: 12.8 g/dL (ref 12.0–15.0)
MCH: 30.7 pg (ref 26.0–34.0)
MCHC: 33.2 g/dL (ref 30.0–36.0)
MCV: 92.3 fL (ref 78.0–100.0)
Platelets: 221 10*3/uL (ref 150–400)
RBC: 4.17 MIL/uL (ref 3.87–5.11)
RDW: 13.7 % (ref 11.5–15.5)
WBC: 12.4 10*3/uL — AB (ref 4.0–10.5)

## 2013-01-25 LAB — TSH: TSH: 3.966 u[IU]/mL (ref 0.350–4.500)

## 2013-01-25 LAB — SEDIMENTATION RATE: Sed Rate: 7 mm/hr (ref 0–22)

## 2013-01-25 LAB — RPR: RPR: NONREACTIVE

## 2013-01-25 MED ORDER — ASPIRIN 325 MG PO TABS
325.0000 mg | ORAL_TABLET | Freq: Every day | ORAL | Status: DC
  Administered 2013-01-25 – 2013-01-27 (×3): 325 mg via ORAL
  Filled 2013-01-25 (×3): qty 1

## 2013-01-25 MED ORDER — SODIUM CHLORIDE 0.9 % IV SOLN
INTRAVENOUS | Status: AC
  Administered 2013-01-25: 03:00:00 via INTRAVENOUS

## 2013-01-25 MED ORDER — MECLIZINE HCL 25 MG PO TABS
25.0000 mg | ORAL_TABLET | Freq: Three times a day (TID) | ORAL | Status: DC | PRN
  Filled 2013-01-25: qty 1

## 2013-01-25 MED ORDER — OXYBUTYNIN CHLORIDE ER 5 MG PO TB24
5.0000 mg | ORAL_TABLET | Freq: Every day | ORAL | Status: DC
  Administered 2013-01-25 – 2013-01-26 (×3): 5 mg via ORAL
  Filled 2013-01-25 (×4): qty 1

## 2013-01-25 MED ORDER — MODAFINIL 200 MG PO TABS
200.0000 mg | ORAL_TABLET | Freq: Every day | ORAL | Status: DC
Start: 2013-01-25 — End: 2013-01-27
  Administered 2013-01-25 – 2013-01-27 (×3): 200 mg via ORAL
  Filled 2013-01-25 (×5): qty 1

## 2013-01-25 MED ORDER — LOSARTAN POTASSIUM 50 MG PO TABS
100.0000 mg | ORAL_TABLET | Freq: Every morning | ORAL | Status: DC
  Administered 2013-01-25 – 2013-01-27 (×3): 100 mg via ORAL
  Filled 2013-01-25 (×3): qty 2

## 2013-01-25 MED ORDER — ARMODAFINIL 250 MG PO TABS: 250.0000 mg | ORAL_TABLET | Freq: Every morning | ORAL | Status: DC

## 2013-01-25 MED ORDER — CARVEDILOL 6.25 MG PO TABS
6.2500 mg | ORAL_TABLET | Freq: Two times a day (BID) | ORAL | Status: DC
  Administered 2013-01-25 – 2013-01-27 (×5): 6.25 mg via ORAL
  Filled 2013-01-25 (×7): qty 1

## 2013-01-25 MED ORDER — MAGNESIUM OXIDE 400 (241.3 MG) MG PO TABS
400.0000 mg | ORAL_TABLET | Freq: Every day | ORAL | Status: DC
  Administered 2013-01-25 – 2013-01-26 (×3): 400 mg via ORAL
  Filled 2013-01-25 (×4): qty 1

## 2013-01-25 MED ORDER — HYDRALAZINE HCL 20 MG/ML IJ SOLN
10.0000 mg | Freq: Four times a day (QID) | INTRAMUSCULAR | Status: DC | PRN
Start: 2013-01-25 — End: 2013-01-27

## 2013-01-25 MED ORDER — AMLODIPINE BESYLATE 5 MG PO TABS
5.0000 mg | ORAL_TABLET | Freq: Every morning | ORAL | Status: DC
  Administered 2013-01-25 – 2013-01-27 (×3): 5 mg via ORAL
  Filled 2013-01-25 (×3): qty 1

## 2013-01-25 MED ORDER — TRAMADOL HCL 50 MG PO TABS
50.0000 mg | ORAL_TABLET | Freq: Two times a day (BID) | ORAL | Status: DC
  Administered 2013-01-25 – 2013-01-27 (×6): 50 mg via ORAL
  Filled 2013-01-25 (×6): qty 1

## 2013-01-25 MED ORDER — PANTOPRAZOLE SODIUM 40 MG PO TBEC
40.0000 mg | DELAYED_RELEASE_TABLET | Freq: Every day | ORAL | Status: DC
  Administered 2013-01-25 – 2013-01-27 (×3): 40 mg via ORAL
  Filled 2013-01-25 (×3): qty 1

## 2013-01-25 MED ORDER — LORATADINE 10 MG PO TABS
10.0000 mg | ORAL_TABLET | Freq: Every day | ORAL | Status: DC
  Administered 2013-01-25 – 2013-01-27 (×3): 10 mg via ORAL
  Filled 2013-01-25 (×3): qty 1

## 2013-01-25 MED ORDER — ENOXAPARIN SODIUM 40 MG/0.4ML ~~LOC~~ SOLN
40.0000 mg | SUBCUTANEOUS | Status: DC
  Administered 2013-01-25 – 2013-01-27 (×3): 40 mg via SUBCUTANEOUS
  Filled 2013-01-25 (×3): qty 0.4

## 2013-01-25 MED ORDER — VITAMIN D 1000 UNITS PO TABS
2000.0000 [IU] | ORAL_TABLET | Freq: Every morning | ORAL | Status: DC
  Administered 2013-01-25 – 2013-01-27 (×3): 2000 [IU] via ORAL
  Filled 2013-01-25 (×3): qty 2

## 2013-01-25 MED ORDER — LEVOTHYROXINE SODIUM 100 MCG PO TABS
100.0000 ug | ORAL_TABLET | Freq: Every day | ORAL | Status: DC
  Administered 2013-01-25 – 2013-01-27 (×3): 100 ug via ORAL
  Filled 2013-01-25 (×4): qty 1

## 2013-01-25 MED ORDER — CYANOCOBALAMIN 1000 MCG/ML IJ SOLN: 1000.0000 ug | INTRAMUSCULAR | Status: DC

## 2013-01-25 MED ORDER — ACETAMINOPHEN 325 MG PO TABS: 650.0000 mg | ORAL_TABLET | ORAL | Status: DC | PRN

## 2013-01-25 MED ORDER — HYDROCODONE-ACETAMINOPHEN 5-325 MG PO TABS
1.0000 | ORAL_TABLET | Freq: Four times a day (QID) | ORAL | Status: DC | PRN
  Administered 2013-01-26 – 2013-01-27 (×2): 1 via ORAL
  Filled 2013-01-25 (×2): qty 1

## 2013-01-25 NOTE — Progress Notes (Signed)
[facility] PROGRESS NOTE  Bailey CarlsGrace Duke WUJ:[id number] DOB: 04/15/1925 DOA: 01/24/2013 PCP: Doreatha MartinVELAZQUEZ,GRETCHEN, MD  Assessment/Plan: Expressive aphasia?/TIA  Patient admitted to telemetry,  MRI/MRI of the brain pending, carotid Dopplers, and 2-D echocardiogram done the results are pending. The patient was initiated on aspirin in the emergency department which will be continued.  Risk stratify the patient by checking hemoglobin A1c and lipid panel.  Hypertension  Stable. Continue home antihypertensive medications. Not well controlled. When necessary hydralazine for systolic blood pressure greater than 160.   Obstructive sleep apnea  Continue CPAP.   Hypothyroidism  Continue levothyroxine.   Depression/anxiety  Continue home medications.   Chronic allergy  Continue home medications.   GERD  Stable.   Prophylaxis  Lovenox.   CODE STATUS  DO NOT RESUSCITATE/DO NOT INTUBATE. This was discussed with patient at the time admission. She has GOLD out of facility DNR form from home.      Code Status: DNR Family Communication: *Discussed with patient in detail Disposition Plan: Home when stable   Consultants:  Neurology   Procedures:  Carotid duplex    Antibiotics:  None  HPI/Subjective: Patient seen and examined, admitted with difficulty with speech.Now back to her baseline.  Objective: Filed Vitals:   01/25/13 1756  BP: 157/74  Pulse: 81  Temp: 98.2 F (36.8 C)  Resp: 18    Intake/Output Summary (Last 24 hours) at 01/25/13 1829 Last data filed at 01/25/13 0134  Gross per 24 hour  Intake      0 ml  Output      1 ml  Net     -1 ml   There were no vitals filed for this visit.  Exam:  Physical Exam: Head: Normocephalic, atraumatic.  Eyes: No signs of jaundice, EOMI Nose: Mucous membranes dry.  Throat: Oropharynx nonerythematous, no exudate appreciated.  Neck: supple,No deformities, masses, or tenderness noted. Lungs: Normal respiratory  effort. B/L Clear to auscultation, no crackles or wheezes.  Heart: Regular RR. S1 and S2 normal  Abdomen: BS normoactive. Soft, Nondistended, non-tender.  Extremities: No pretibial edema, no erythema Neuro: No focal deficit noted  Data Reviewed: Basic Metabolic Panel:  Recent Labs Lab 01/24/13 2008 01/25/13 0450  NA 140 142  K 4.5 3.8  CL 104 106  CO2 26 20  GLUCOSE 106* 108*  BUN 26* 20  CREATININE 1.06 0.84  CALCIUM 9.7 9.6   Liver Function Tests:  Recent Labs Lab 01/24/13 2008  AST 23  ALT 16  ALKPHOS 90  BILITOT <0.2*  PROT 6.1  ALBUMIN 3.4*   No results found for this basename: LIPASE, AMYLASE,  in the last 168 hours No results found for this basename: AMMONIA,  in the last 168 hours CBC:  Recent Labs Lab 01/24/13 2008 01/25/13 0450  WBC 9.6 12.4*  HGB 12.1 12.8  HCT 36.7 38.5  MCV 92.9 92.3  PLT 224 221   Cardiac Enzymes: No results found for this basename: CKTOTAL, CKMB, CKMBINDEX, TROPONINI,  in the last 168 hours BNP (last 3 results) No results found for this basename: PROBNP,  in the last 8760 hours CBG:  Recent Labs Lab 01/24/13 2018 01/25/13 1119 01/25/13 1640  GLUCAP 95 93 106*    Recent Results (from the past 240 hour(s))  URINE CULTURE     Status: None   Collection Time    01/24/13  7:48 PM      Result Value Range Status   Specimen Description URINE, CLEAN CATCH   Final   Special  Requests NONE   Final   Culture  Setup Time     Final   Value: 01/24/2013 21:17     Performed at [facility] Count     Final   Value: 50,000 COLONIES/ML     Performed at [facility]   Culture     Final   Value: Multiple bacterial morphotypes present, none predominant. Suggest appropriate recollection if clinically indicated.     Performed at [facility]   Report Status 01/25/2013 FINAL   Final     Studies: Ct Head Wo Contrast  01/24/2013   CLINICAL DATA:  Altered mental status. Dementia. Worsening confusion.   EXAM: CT HEAD WITHOUT CONTRAST  TECHNIQUE: Contiguous axial images were obtained from the base of the skull through the vertex without intravenous contrast.  COMPARISON:  02/24/2012 MRI report only  FINDINGS: Sinuses/Soft tissues: Clear paranasal sinuses and mastoid air cells.  Intracranial: No mass lesion, hemorrhage, hydrocephalus, acute infarct, intra-axial, or extra-axial fluid collection. Mild-to-moderate low density in the periventricular white matter likely related to small vessel disease.  IMPRESSION: No acute intracranial abnormality.  Small vessel ischemic change.   Electronically Signed   By: Jeronimo Greaves M.D.   On: 01/24/2013 21:13    Scheduled Meds: . amLODipine  5 mg Oral q morning - 10a  . aspirin  325 mg Oral Daily  . carvedilol  6.25 mg Oral BID WC  . cholecalciferol  2,000 Units Oral q morning - 10a  . [START ON 01/29/2013] cyanocobalamin  1,000 mcg Intramuscular Q30 days  . enoxaparin (LOVENOX) injection  40 mg Subcutaneous Q24H  . levothyroxine  100 mcg Oral QAC breakfast  . loratadine  10 mg Oral Daily  . losartan  100 mg Oral q morning - 10a  . magnesium oxide  400 mg Oral QHS  . modafinil  200 mg Oral Daily  . oxybutynin  5 mg Oral QHS  . pantoprazole  40 mg Oral Daily  . traMADol  50 mg Oral BID   Continuous Infusions:   Principal Problem:   TIA (transient ischemic attack) Active Problems:   Allergic rhinitis due to pollen   Allergic conjunctivitis   Obstructive sleep apnea   Hypothyroid   Hypertension   Depressive disorder   Anxiety   GERD (gastroesophageal reflux disease)    Time spent: 25 min    [facility]  [facility] Pager [phone]. If 7PM-7AM, please contact night-coverage at [email or web address], password [facility] 01/25/2013, 6:29 PM  LOS: 1 day

## 2013-01-25 NOTE — Evaluation (Signed)
Occupational Therapy Evaluation Patient Details Name: Bailey Duke MRN: [id number] DOB: 20-Oct-1925 Today's Date: 01/25/2013 Time: 3419-3790 OT Time Calculation (min): 38 min  OT Assessment / Plan / Recommendation History of present illness Bailey Duke is a 80 y.o. Caucasian female with history of hypertension, obstructive sleep apnea on CPAP, depression, anxiety, GERD, and dementia who presents with the above complaints.  Patient worse tangential, was difficult to reorient.  Patient was in a rehabilitation center for 7 weeks after she had a fall, denies any fractures.  She has been at independent living facility for the last week.  Yesterday afternoon around 12, when men came to work at her garden she indicated that she had difficulty finding words for 10 minutes. Now being worked up for CVA.     Clinical Impression   Patient evaluated by Occupational Therapy with no further acute OT needs identified. Defer all further OT to [facility] level of care. Pt is not safe to be independent at this time. See below for any follow-up Occupational Therapy or equipment needs. OT to sign off. Thank you for referral.      OT Assessment  All further OT needs can be met in the next venue of care    Follow Up Recommendations  SNF    Barriers to Discharge      Equipment Recommendations  None recommended by OT    Recommendations for Other Services    Frequency       Precautions / Restrictions Precautions Precautions: Fall   Pertinent Vitals/Pain None reported    ADL  Eating/Feeding: Independent Where Assessed - Eating/Feeding: Edge of bed Grooming: Wash/dry hands;Wash/dry face;Min guard Where Assessed - Grooming: Unsupported standing Lower Body Dressing: Maximal assistance Where Assessed - Lower Body Dressing: Unsupported sit to stand Toilet Transfer: Min Psychiatric nurse Method: Sit to Loss adjuster, chartered: Regular height toilet Equipment Used: Gait belt;Rolling  walker Transfers/Ambulation Related to ADLs: Pt ambualting min guard (A) ~150 ft. Pt easily lost and needing redirection ADL Comments: Pt don slip on slippers. Pt drinking coffee from straw. pt completing bed mobilty. pt very easily distracted and poor recall.     OT Diagnosis:    OT Problem List:   OT Treatment Interventions:     OT Goals(Current goals can be found in the care plan section) Acute Rehab OT Goals Patient Stated Goal: Go back to independent living  Visit Information  Last OT Received On: 01/25/13 Assistance Needed: +1 History of Present Illness: Bailey Duke is a 80 y.o. Caucasian female with history of hypertension, obstructive sleep apnea on CPAP, depression, anxiety, GERD, and dementia who presents with the above complaints.  Patient worse tangential, was difficult to reorient.  Patient was in a rehabilitation center for 7 weeks after she had a fall, denies any fractures.  She has been at independent living facility for the last week.  Yesterday afternoon around 12, when men came to work at her garden she indicated that she had difficulty finding words for 10 minutes. Now being worked up for CVA.         Prior [name] expects to be discharged to:: Assisted living Home Equipment: None Additional Comments: Pt would like to go back to independent living facility.  Prior Function Level of Independence: Independent with assistive device(s) Comments: Began using a RW at some point, once pt reports she just began using RW a few days ago but later pt reports she has been using RW for  a few months.  Communication Communication:  (pt discussing topics unrelated to therapy session) Dominant Hand: Right         Vision/Perception Vision - History Baseline Vision: Wears glasses all the time   Cognition  Cognition Arousal/Alertness: Awake/alert Behavior During Therapy: WFL for tasks assessed/performed Overall Cognitive Status:  Impaired/Different from baseline Area of Impairment: Attention;Memory;Following commands;Safety/judgement;Awareness;Problem solving Current Attention Level: Focused Memory: Decreased short-term memory Following Commands: Follows one step commands with increased time Safety/Judgement: Decreased awareness of safety;Decreased awareness of deficits Awareness: Emergent;Anticipatory Problem Solving: Slow processing;Difficulty sequencing General Comments: Pt required (A) to locate room. pt asking if the opposite sid e the hall was new. Pt asking if the high side of [address] was new and just added to the low side of [address]. Pt discussing topics unrelated to any topic or task during OT session. pt needed constant redirection. pt very passionate about "making a difference" Pt discussing all her concerns with [place] .    Extremity/Trunk Assessment Upper Extremity Assessment Upper Extremity Assessment: Overall WFL for tasks assessed Lower Extremity Assessment Lower Extremity Assessment: Defer to PT evaluation Cervical / Trunk Assessment Cervical / Trunk Assessment: Kyphotic     Mobility Bed Mobility Bed Mobility: Supine to Sit;Sitting - Scoot to Edge of Bed;Sit to Supine Supine to Sit: 4: Min guard;With rails;HOB elevated Sitting - Scoot to Edge of Bed: 4: Min guard;With rail Sit to Supine: 4: Min guard;HOB flat Details for Bed Mobility Assistance: incr time Transfers Transfers: Sit to Stand;Stand to Sit Sit to Stand: 4: Min guard;From bed Stand to Sit: 4: Min guard;To bed     Exercise     Balance     End of Session OT - End of Session Activity Tolerance: Patient tolerated treatment well Patient left: in bed;with call bell/phone within reach Nurse Communication: Mobility status;Precautions  GO     Peri Maris 01/25/2013, 4:32 PM Pager: [phone]

## 2013-01-25 NOTE — Progress Notes (Signed)
*  PRELIMINARY RESULTS* Vascular Ultrasound Carotid Duplex (Doppler) has been completed.  Preliminary findings: Bilateral:  1-39% ICA stenosis.  Vertebral artery flow is antegrade.      Farrel DemarkJill Eunice, RDMS, RVT  01/25/2013, 11:00 AM

## 2013-01-25 NOTE — Evaluation (Signed)
Physical Therapy Evaluation Patient Details Name: Bailey Duke MRN: [id number] DOB: April 14, 1925 Today's Date: 01/25/2013 Time: 4098-1191 PT Time Calculation (min): 32 min  PT Assessment / Plan / Recommendation History of Present Illness  Bailey Duke is a 80 y.o. Caucasian female with history of hypertension, obstructive sleep apnea on CPAP, depression, anxiety, GERD, and dementia who presents with the above complaints.  Patient worse tangential, was difficult to reorient.  Patient was in a rehabilitation center for 7 weeks after she had a fall, denies any fractures.  She has been at independent living facility for the last week.  Yesterday afternoon around 12, when men came to work at her garden she indicated that she had difficulty finding words for 10 minutes. Now being worked up for CVA.    Clinical Impression  Pt presents with decreased dynamic stability during functional mobility and impaired cognition (decreased attention which impairs safety, tangential thought, and delayed processing). Pt will need 24 hour supervision/assist at D/C therefore recommending SNF instead of independent living. Pt will benefit from acute PT to address deficits listed below.     PT Assessment  Patient needs continued PT services    Follow Up Recommendations  SNF;Other (comment);Supervision/Assistance - 24 hour (unless significant progress made. ) Pt may need to be made inpatient instead of observation.       Barriers to Discharge Decreased caregiver support Pt will likely need more supervision/assist than will be provided at independent living facility.    Equipment Recommendations  Other (comment) (to be determined)       Frequency Min 3X/week    Precautions / Restrictions Precautions Precautions: Fall   Pertinent Vitals/Pain Some low back pain but was unable to rate, RN aware.       Mobility  Bed Mobility Bed Mobility: Supine to Sit Supine to Sit: 5: Supervision;HOB flat Details for Bed  Mobility Assistance: Increased time but no physical assist needed Transfers Transfers: Sit to Stand;Stand to Sit Sit to Stand: 4: Min assist;From bed;From chair/3-in-1 Stand to Sit: 4: Min assist;To chair/3-in-1 Details for Transfer Assistance: Slow to initiate, more difficulty with lower surfaces.  Ambulation/Gait Ambulation/Gait Assistance: 4: Min assist Ambulation Distance (Feet): 20 Feet Assistive device: Rolling walker Ambulation/Gait Assistance Details: Pt very slow with movement, weak knees noted. Some difficulty with turns, needs assist to manage RW.  Gait Pattern: Step-to pattern;Decreased stride length;Shuffle Stairs: No        PT Diagnosis: Difficulty walking;Abnormality of gait;Generalized weakness;Altered mental status  PT Problem List: Decreased strength;Decreased activity tolerance;Decreased balance;Decreased mobility;Decreased cognition;Decreased knowledge of use of DME PT Treatment Interventions: DME instruction;Gait training;Functional mobility training;Therapeutic activities;Therapeutic exercise;Balance training;Neuromuscular re-education;Cognitive remediation;Patient/family education     PT Goals(Current goals can be found in the care plan section) Acute Rehab PT Goals Patient Stated Goal: Go back to independent living PT Goal Formulation: With patient Time For Goal Achievement: 02/01/13 Potential to Achieve Goals: Good  Visit Information  Last PT Received On: 01/25/13 Assistance Needed: +1 History of Present Illness: Bailey Duke is a 80 y.o. Caucasian female with history of hypertension, obstructive sleep apnea on CPAP, depression, anxiety, GERD, and dementia who presents with the above complaints.  Patient worse tangential, was difficult to reorient.  Patient was in a rehabilitation center for 7 weeks after she had a fall, denies any fractures.  She has been at independent living facility for the last week.  Yesterday afternoon around 12, when men came to work at  her garden she indicated that she had difficulty finding words for  10 minutes. Now being worked up for CVA.         Prior Functioning  Home Living Family/patient expects to be discharged to:: Assisted living Additional Comments: Pt would like to go back to independent living facility.  Prior Function Level of Independence: Independent with assistive device(s) Comments: Began using a RW at some point, once pt reports she just began using RW a few days ago but later pt reports she has been using RW for a few months.  Communication Communication:  (Very tangential and difficult to follow or keep on task)    Cognition  Cognition Arousal/Alertness:  (Awake but slightly leathargic) Behavior During Therapy: WFL for tasks assessed/performed Overall Cognitive Status: Impaired/Different from baseline (unclear what baseline was) Area of Impairment: Attention;Memory;Awareness;Problem solving Current Attention Level: Focused Memory: Decreased short-term memory Awareness: Intellectual;Emergent Problem Solving: Slow processing;Difficulty sequencing;Requires verbal cues    Extremity/Trunk Assessment Lower Extremity Assessment Lower Extremity Assessment: Generalized weakness (no Rt/Lt differences) Cervical / Trunk Assessment Cervical / Trunk Assessment: Kyphotic   Balance Balance Balance Assessed: Yes Static Sitting Balance Static Sitting - Balance Support: Right upper extremity supported Static Sitting - Level of Assistance: 5: Stand by assistance Static Standing Balance Static Standing - Balance Support: No upper extremity supported Static Standing - Level of Assistance: 4: Min assist Static Standing - Comment/# of Minutes: Increased sway noted, min assist for stability  End of Session PT - End of Session Equipment Utilized During Treatment: Gait belt Activity Tolerance: Patient limited by fatigue Patient left: in chair;with call bell/phone within reach;with chair alarm set Nurse  Communication: Mobility status  GP Functional Limitation: Mobility: Walking and moving around Mobility: Walking and Moving Around Current Status [phone]([id number]): At least 20 percent but less than 40 percent impaired, limited or restricted Mobility: Walking and Moving Around Goal Status [phone]([id number]): At least 1 percent but less than 20 percent impaired, limited or restricted   Wilhemina BonitoGillispie, Kristalyn Bergstresser Cheek 01/25/2013, 9:08 AM

## 2013-01-26 ENCOUNTER — Observation Stay (HOSPITAL_COMMUNITY): Payer: Medicare Other

## 2013-01-26 ENCOUNTER — Encounter (HOSPITAL_COMMUNITY): Payer: Self-pay | Admitting: *Deleted

## 2013-01-26 DIAGNOSIS — I519 Heart disease, unspecified: Secondary | ICD-10-CM

## 2013-01-26 MED ORDER — LORAZEPAM 2 MG/ML IJ SOLN
1.0000 mg | Freq: Once | INTRAMUSCULAR | Status: AC
  Administered 2013-01-26: 1 mg via INTRAVENOUS

## 2013-01-26 MED ORDER — LORAZEPAM 2 MG/ML IJ SOLN
INTRAMUSCULAR | Status: AC
  Filled 2013-01-26: qty 1

## 2013-01-26 NOTE — Progress Notes (Signed)
[facility] PROGRESS NOTE  Georga Stys AVW:[id number] DOB: 1925/04/30 DOA: 01/24/2013 PCP: [name], MD  Assessment/Plan:  Expressive aphasia?/TIA  Patient admitted to telemetry,  MRI/MRI of the brain pending, carotid Dopplers are normal,  and 2-D echocardiogram is pending. The patient was initiated on aspirin in the emergency department which will be continued.  Risk stratify the patient by checking hemoglobin A1c and lipid panel.  ? Dementia Spoke to the patient's nephew , and he says that she has been confused intermittently over past few weeks.  Knee pain Xray of the knee is normal, but she may have ruptured the tendon. Will get Ortho consult.  Hypertension  Stable. Continue home antihypertensive medications. Not well controlled. When necessary hydralazine for systolic blood pressure greater than 160.   Obstructive sleep apnea  Continue CPAP.   Hypothyroidism  Continue levothyroxine.   Depression/anxiety  Continue home medications.   Chronic allergy  Continue home medications.   GERD  Stable.   Prophylaxis  Lovenox.   CODE STATUS  DO NOT RESUSCITATE/DO NOT INTUBATE. This was discussed with patient at the time admission. She has GOLD out of facility DNR form from home.      Code Status: DNR Family Communication: *Discussed with patient's nephew Gerlene Burdock [phone] today Disposition Plan: Home when stable   Consultants:  Neurology   Procedures:  Carotid duplex    Antibiotics:  None  HPI/Subjective: Patient seen and examined, became confused this am requiring one dose of ativan. MRI is pending.Complains of knee giving way today.  Objective: Filed Vitals:   01/26/13 0425  BP: 149/66  Pulse: 76  Temp: 97.1 F (36.2 C)  Resp: 16    Intake/Output Summary (Last 24 hours) at 01/26/13 0932 Last data filed at 01/26/13 0600  Gross per 24 hour  Intake      0 ml  Output      4 ml  Net     -4 ml   There were no vitals  filed for this visit.  Exam:  Physical Exam: Head: Normocephalic, atraumatic.  Eyes: No signs of jaundice, EOMI Nose: Mucous membranes dry.  Throat: Oropharynx nonerythematous, no exudate appreciated.  Neck: supple,No deformities, masses, or tenderness noted. Lungs: Normal respiratory effort. B/L Clear to auscultation, no crackles or wheezes.  Heart: Regular RR. S1 and S2 normal  Abdomen: BS normoactive. Soft, Nondistended, non-tender.  Extremities: Knee- No limitation of ROM, patella does move laterally on flexeion Neuro: No focal deficit noted  Data Reviewed: Basic Metabolic Panel:  Recent Labs Lab 01/24/13 2008 01/25/13 0450  NA 140 142  K 4.5 3.8  CL 104 106  CO2 26 20  GLUCOSE 106* 108*  BUN 26* 20  CREATININE 1.06 0.84  CALCIUM 9.7 9.6   Liver Function Tests:  Recent Labs Lab 01/24/13 2008  AST 23  ALT 16  ALKPHOS 90  BILITOT <0.2*  PROT 6.1  ALBUMIN 3.4*   No results found for this basename: LIPASE, AMYLASE,  in the last 168 hours No results found for this basename: AMMONIA,  in the last 168 hours CBC:  Recent Labs Lab 01/24/13 2008 01/25/13 0450  WBC 9.6 12.4*  HGB 12.1 12.8  HCT 36.7 38.5  MCV 92.9 92.3  PLT 224 221   Cardiac Enzymes: No results found for this basename: CKTOTAL, CKMB, CKMBINDEX, TROPONINI,  in the last 168 hours BNP (last 3 results) No results found for this basename: PROBNP,  in the last 8760 hours CBG:  Recent Labs Lab 01/24/13 2018 01/25/13  1119 01/25/13 1640  GLUCAP 95 93 106*    Recent Results (from the past 240 hour(s))  URINE CULTURE     Status: None   Collection Time    01/24/13  7:48 PM      Result Value Range Status   Specimen Description URINE, CLEAN CATCH   Final   Special Requests NONE   Final   Culture  Setup Time     Final   Value: 01/24/2013 21:17     Performed at [facility] Count     Final   Value: 50,000 COLONIES/ML     Performed at [facility]   Culture      Final   Value: Multiple bacterial morphotypes present, none predominant. Suggest appropriate recollection if clinically indicated.     Performed at [facility]   Report Status 01/25/2013 FINAL   Final     Studies: Dg Knee 1-2 Views Left  02/04/13   CLINICAL DATA:  Knee pain. Fell. Postop 1 year. Question dislocation.  EXAM: LEFT KNEE - 1-2 VIEW  COMPARISON:  None.  FINDINGS: Post total left knee replacement. No evidence of fracture or dislocation noted on this two view examination.  Question soft tissue prominence medial aspect of the left knee. Soft tissue injury not excluded.  IMPRESSION: Post total left knee replacement. No evidence of fracture or dislocation noted on this two view examination.  Question soft tissue prominence medial aspect of the left knee. Soft tissue injury not excluded.   Electronically Signed   By: Bridgett Larsson M.D.   On: 2013/02/04 07:09   Ct Head Wo Contrast  01/24/2013   CLINICAL DATA:  Altered mental status. Dementia. Worsening confusion.  EXAM: CT HEAD WITHOUT CONTRAST  TECHNIQUE: Contiguous axial images were obtained from the base of the skull through the vertex without intravenous contrast.  COMPARISON:  02/24/2012 MRI report only  FINDINGS: Sinuses/Soft tissues: Clear paranasal sinuses and mastoid air cells.  Intracranial: No mass lesion, hemorrhage, hydrocephalus, acute infarct, intra-axial, or extra-axial fluid collection. Mild-to-moderate low density in the periventricular white matter likely related to small vessel disease.  IMPRESSION: No acute intracranial abnormality.  Small vessel ischemic change.   Electronically Signed   By: Jeronimo Greaves M.D.   On: 01/24/2013 21:13    Scheduled Meds: . amLODipine  5 mg Oral q morning - 10a  . aspirin  325 mg Oral Daily  . carvedilol  6.25 mg Oral BID WC  . cholecalciferol  2,000 Units Oral q morning - 10a  . [START ON 01/29/2013] cyanocobalamin  1,000 mcg Intramuscular Q30 days  . enoxaparin (LOVENOX) injection   40 mg Subcutaneous Q24H  . levothyroxine  100 mcg Oral QAC breakfast  . loratadine  10 mg Oral Daily  . LORazepam      . losartan  100 mg Oral q morning - 10a  . magnesium oxide  400 mg Oral QHS  . modafinil  200 mg Oral Daily  . oxybutynin  5 mg Oral QHS  . pantoprazole  40 mg Oral Daily  . traMADol  50 mg Oral BID   Continuous Infusions:   Principal Problem:   TIA (transient ischemic attack) Active Problems:   Allergic rhinitis due to pollen   Allergic conjunctivitis   Obstructive sleep apnea   Hypothyroid   Hypertension   Depressive disorder   Anxiety   GERD (gastroesophageal reflux disease)    Time spent: 25 min    Mikyah Alamo S  [facility] Pager [phone]. If 7PM-7AM, please contact night-coverage at [email or web address], password [email or web address] 01/26/2013, 9:32 AM  LOS: 2 days

## 2013-01-26 NOTE — Progress Notes (Signed)
  Echocardiogram 2D Echocardiogram has been performed.  Bailey Duke, Bailey Duke 01/26/2013, 11:22 AM

## 2013-01-26 NOTE — Progress Notes (Signed)
PT Cancellation Note  Patient Details Name: Bailey Duke MRN: [id number] DOB: 10/10/1925   Cancelled Treatment:    Reason Eval/Treat Not Completed: Medical issues which prohibited therapy. Per RN patient is now on bedrest and NWB on L leg. Will follow up when new orders written.     Fredrich BirksRobinette, Julia Elizabeth 01/26/2013, 8:21 AM

## 2013-01-26 NOTE — Progress Notes (Signed)
Routine adult EEG completed. 

## 2013-01-26 NOTE — Consult Note (Signed)
Previous surgery by Dr. Lorie PhenixSpangler. About 2013 per pt. Possibly 2012. Components well fixed and in good position.

## 2013-01-26 NOTE — Progress Notes (Signed)
NEURO HOSPITALIST PROGRESS NOTE   SUBJECTIVE:                                                                                                                         Patient is very drowsy but has just received ativan.  Her main complaint at this point is left knee pain .    OBJECTIVE:                                                                                                                           Vital signs in last 24 hours: Temp:  [97.1 F (36.2 C)-99 F (37.2 C)] 97.1 F (36.2 C) (01/02 0425) Pulse Rate:  [67-81] 76 (01/02 0425) Resp:  [16-18] 16 (01/02 0425) BP: (147-157)/(62-87) 149/66 mmHg (01/02 0425) SpO2:  [95 %-100 %] 95 % (01/02 0425)  Intake/Output from previous day: 01/01 0701 - 01/02 0700 In: -  Out: 4 [Urine:4] Intake/Output this shift:   Nutritional status: Cardiac  Past Medical History  Diagnosis Date  . Hypertension   . Sleep apnea   . Spinal stenosis   . Depressive disorder   . Anxiety   . Diaphragmatic hernia   . Dementia   . GERD (gastroesophageal reflux disease)      Neurologic Exam:  Mental Status: Drowsy, perseverating on her left knee pain but knows she is in the hospital.  Does not recall seeing any other neurologist while in the hospital and cannot recall seeing Dr. Thad Ranger in consultation.   Speech fluent without evidence of aphasia.  Able to follow 3 step commands without difficulty. Cranial Nerves: II: Visual fields grossly normal, pupils equal, round, reactive to light and accommodation III,IV, VI: ptosis not present, extra-ocular motions intact bilaterally V,VII: smile symmetric, facial light touch sensation normal bilaterally VIII: hearing normal bilaterally IX,X: gag reflex present XI: bilateral shoulder shrug XII: midline tongue extension without atrophy or fasciculations  Motor: Right : Upper extremity   5/5    Left:     Upper extremity   5/5  Lower extremity   5/5     Lower  extremity   4/5 due to pain Tone and bulk:normal tone throughout; no atrophy noted Sensory: Pinprick and light touch intact throughout, bilaterally  Deep Tendon Reflexes:   1+ in the upper extremities, knees not tested due to surgeries, absent AJ's bilaterally.   Plantars: Right: downgoing   Left: downgoing Cerebellar: normal finger-to-nose,      Lab Results: Lab Results  Component Value Date/Time   CHOL 211* 01/25/2013  4:50 AM   Lipid Panel  Recent Labs  01/25/13 0450  CHOL 211*  TRIG 86  HDL 69  CHOLHDL 3.1  VLDL 17  LDLCALC 161125*        Studies/Results: Dg Knee 1-2 Views Left  01/26/2013   CLINICAL DATA:  Knee pain. Fell. Postop 1 year. Question dislocation.  EXAM: LEFT KNEE - 1-2 VIEW  COMPARISON:  None.  FINDINGS: Post total left knee replacement. No evidence of fracture or dislocation noted on this two view examination.  Question soft tissue prominence medial aspect of the left knee. Soft tissue injury not excluded.  IMPRESSION: Post total left knee replacement. No evidence of fracture or dislocation noted on this two view examination.  Question soft tissue prominence medial aspect of the left knee. Soft tissue injury not excluded.   Electronically Signed   By: Bridgett LarssonSteve  Olson M.D.   On: 01/26/2013 07:09   Ct Head Wo Contrast  01/24/2013   CLINICAL DATA:  Altered mental status. Dementia. Worsening confusion.  EXAM: CT HEAD WITHOUT CONTRAST  TECHNIQUE: Contiguous axial images were obtained from the base of the skull through the vertex without intravenous contrast.  COMPARISON:  02/24/2012 MRI report only  FINDINGS: Sinuses/Soft tissues: Clear paranasal sinuses and mastoid air cells.  Intracranial: No mass lesion, hemorrhage, hydrocephalus, acute infarct, intra-axial, or extra-axial fluid collection. Mild-to-moderate low density in the periventricular white matter likely related to small vessel disease.  IMPRESSION: No acute intracranial abnormality.  Small vessel ischemic  change.   Electronically Signed   By: Jeronimo GreavesKyle  Talbot M.D.   On: 01/24/2013 21:13    MEDICATIONS                                                                                                                        Scheduled: . amLODipine  5 mg Oral q morning - 10a  . aspirin  325 mg Oral Daily  . carvedilol  6.25 mg Oral BID WC  . cholecalciferol  2,000 Units Oral q morning - 10a  . [START ON 01/29/2013] cyanocobalamin  1,000 mcg Intramuscular Q30 days  . enoxaparin (LOVENOX) injection  40 mg Subcutaneous Q24H  . levothyroxine  100 mcg Oral QAC breakfast  . loratadine  10 mg Oral Daily  . LORazepam      . losartan  100 mg Oral q morning - 10a  . magnesium oxide  400 mg Oral QHS  . modafinil  200 mg Oral Daily  . oxybutynin  5 mg Oral QHS  . pantoprazole  40 mg Oral Daily  . traMADol  50 mg Oral BID   Vascular Ultrasound  Carotid Duplex (Doppler) has been completed. Preliminary findings: Bilateral: 1-39% ICA stenosis.  Vertebral artery flow is antegrade  B12--pending Folate Pending TSH 3.966 SED 7 RPR (-)  MRI/MRA head pending 2 D echo Pending     ASSESSMENT/PLAN:                                                                                                            80 y.o. female presenting with difficulty with speech. Otherwise neurological examination is unremarkable.  Speech remains clear at this time but she is still having tangential thoughts and cannot recall recent events such as see a neurologist this hospitalization. Per relative, she has been having noticeable cognitive decline.  Will continue to follow work up.   MRI/MRA head pending 2 D echo Pending B12, Folate pending EEG pending   Assessment and plan discussed with with attending physician and they are in agreement.    Felicie Morn PA-C [facility] [phone]  01/26/2013, 9:22 AM

## 2013-01-26 NOTE — Consult Note (Signed)
Reason for Consult:left knee pain Referring Physician: admitting physician  Bailey Duke is an 80 y.o. female.  HPI: Orthopedic service asked to see pt KZ:SWFU knee pain.  Pt unable to give very detailed history today as she continues to drift off to sleep while talking and could not answer most questions as she would lose her train of thought after only a few seconds.  Per documented history she was treated at a rehab facility after a fall over the past month or so.  Apparently no fractures.  At independent facility last week.  She is S/P left TKR by MD in [facility] in 2013(? Pt thinks).  She speaks of having pain starting yesterday when she got out of bed and does not recall an injury.  Cannot give a specific location of the pain in her knee. Has not been up today but previous PT notes indicate weakness of both legs with activity.  She is unable to recall if she was having trouble with her knee before this admission.  Ho history of post op infection or complications that is documented or that the pt recalls.  Past Medical History  Diagnosis Date  . Hypertension   . Sleep apnea   . Spinal stenosis   . Depressive disorder   . Anxiety   . Diaphragmatic hernia   . Dementia   . GERD (gastroesophageal reflux disease)     Past Surgical History  Procedure Laterality Date  . Joint replacement      Family History  Problem Relation Age of Onset  . Breast cancer Mother   . Other Father     Hardening of the arteries to the head    Social History:  reports that she has never smoked. She does not have any smokeless tobacco history on file. She reports that she drinks about 1.2 ounces of alcohol per week. She reports that she does not use illicit drugs.  Allergies:  Allergies  Allergen Reactions  . Penicillins   . Sulfa Antibiotics     Medications: I have reviewed the patient's current medications.  Results for orders placed during the hospital encounter of 01/24/13 (from the past 48  hour(s))  URINALYSIS, ROUTINE W REFLEX MICROSCOPIC     Status: Abnormal   Collection Time    01/24/13  7:48 PM      Result Value Range   Color, Urine YELLOW  YELLOW   APPearance HAZY (*) CLEAR   Specific Gravity, Urine 1.016  1.005 - 1.030   pH 6.0  5.0 - 8.0   Glucose, UA NEGATIVE  NEGATIVE mg/dL   Hgb urine dipstick NEGATIVE  NEGATIVE   Bilirubin Urine NEGATIVE  NEGATIVE   Ketones, ur NEGATIVE  NEGATIVE mg/dL   Protein, ur NEGATIVE  NEGATIVE mg/dL   Urobilinogen, UA 0.2  0.0 - 1.0 mg/dL   Nitrite NEGATIVE  NEGATIVE   Leukocytes, UA SMALL (*) NEGATIVE  URINE MICROSCOPIC-ADD ON     Status: Abnormal   Collection Time    01/24/13  7:48 PM      Result Value Range   Squamous Epithelial / LPF RARE  RARE   WBC, UA 7-10  <3 WBC/hpf   Bacteria, UA FEW (*) RARE  URINE CULTURE     Status: None   Collection Time    01/24/13  7:48 PM      Result Value Range   Specimen Description URINE, CLEAN CATCH     Special Requests NONE     Culture  Setup  Time       Value: 01/24/2013 21:17     Performed at [facility] Count       Value: 50,000 COLONIES/ML     Performed at [facility]   Culture       Value: Multiple bacterial morphotypes present, none predominant. Suggest appropriate recollection if clinically indicated.     Performed at [facility]   Report Status 01/25/2013 FINAL    COMPREHENSIVE METABOLIC PANEL     Status: Abnormal   Collection Time    01/24/13  8:08 PM      Result Value Range   Sodium 140  137 - 147 mEq/L   Comment: Please note change in reference range.   Potassium 4.5  3.7 - 5.3 mEq/L   Comment: Please note change in reference range.   Chloride 104  96 - 112 mEq/L   CO2 26  19 - 32 mEq/L   Glucose, Bld 106 (*) 70 - 99 mg/dL   BUN 26 (*) 6 - 23 mg/dL   Creatinine, Ser 1.06  0.50 - 1.10 mg/dL   Calcium 9.7  8.4 - 10.5 mg/dL   Total Protein 6.1  6.0 - 8.3 g/dL   Albumin 3.4 (*) 3.5 - 5.2 g/dL   AST 23  0 - 37 U/L   Comment:  HEMOLYSIS AT THIS LEVEL MAY AFFECT RESULT   ALT 16  0 - 35 U/L   Alkaline Phosphatase 90  39 - 117 U/L   Total Bilirubin <0.2 (*) 0.3 - 1.2 mg/dL   Comment: REPEATED TO VERIFY   GFR calc non Af Amer 47 (*) >90 mL/min   GFR calc Af Amer 54 (*) >90 mL/min   Comment: (NOTE)     The eGFR has been calculated using the CKD EPI equation.     This calculation has not been validated in all clinical situations.     eGFR's persistently <90 mL/min signify possible Chronic Kidney     Disease.  CBC     Status: None   Collection Time    01/24/13  8:08 PM      Result Value Range   WBC 9.6  4.0 - 10.5 K/uL   RBC 3.95  3.87 - 5.11 MIL/uL   Hemoglobin 12.1  12.0 - 15.0 g/dL   HCT 36.7  36.0 - 46.0 %   MCV 92.9  78.0 - 100.0 fL   MCH 30.6  26.0 - 34.0 pg   MCHC 33.0  30.0 - 36.0 g/dL   RDW 13.6  11.5 - 15.5 %   Platelets 224  150 - 400 K/uL  GLUCOSE, CAPILLARY     Status: None   Collection Time    01/24/13  8:18 PM      Result Value Range   Glucose-Capillary 95  70 - 99 mg/dL  RPR     Status: None   Collection Time    01/25/13  4:50 AM      Result Value Range   RPR NON REACTIVE  NON REACTIVE   Comment: Performed at [facility]  TSH     Status: None   Collection Time    01/25/13  4:50 AM      Result Value Range   TSH 3.966  0.350 - 4.500 uIU/mL   Comment: Performed at [facility]     Status: None   Collection Time    01/25/13  4:50 AM  Result Value Range   Sed Rate 7  0 - 22 mm/hr  HEMOGLOBIN A1C     Status: Abnormal   Collection Time    01/25/13  4:50 AM      Result Value Range   Hemoglobin A1C 5.8 (*) <5.7 %   Comment: (NOTE)                                                                               According to the ADA Clinical Practice Recommendations for 2011, when     HbA1c is used as a screening test:      >=6.5%   Diagnostic of Diabetes Mellitus               (if abnormal result is confirmed)     5.7-6.4%   Increased risk of  developing Diabetes Mellitus     References:Diagnosis and Classification of Diabetes Mellitus,Diabetes     BDZH,2992,42(ASTMH 1):S62-S69 and Standards of Medical Care in             Diabetes - 2011,Diabetes Care,2011,34 (Suppl 1):S11-S61.   Mean Plasma Glucose 120 (*) <117 mg/dL   Comment: Performed at [facility]     Status: Abnormal   Collection Time    01/25/13  4:50 AM      Result Value Range   Cholesterol 211 (*) 0 - 200 mg/dL   Triglycerides 86  <150 mg/dL   HDL 69  >39 mg/dL   Total CHOL/HDL Ratio 3.1     VLDL 17  0 - 40 mg/dL   LDL Cholesterol 125 (*) 0 - 99 mg/dL   Comment:            Total Cholesterol/HDL:CHD Risk     Coronary Heart Disease Risk Table                         Men   Women      1/2 Average Risk   3.4   3.3      Average Risk       5.0   4.4      2 X Average Risk   9.6   7.1      3 X Average Risk  23.4   11.0                Use the calculated Patient Ratio     above and the CHD Risk Table     to determine the patient's CHD Risk.                ATP III CLASSIFICATION (LDL):      <100     mg/dL   Optimal      100-129  mg/dL   Near or Above                        Optimal      130-159  mg/dL   Borderline      160-189  mg/dL   High      >190     mg/dL   Very High  CBC     Status:  Abnormal   Collection Time    01/25/13  4:50 AM      Result Value Range   WBC 12.4 (*) 4.0 - 10.5 K/uL   RBC 4.17  3.87 - 5.11 MIL/uL   Hemoglobin 12.8  12.0 - 15.0 g/dL   HCT 38.5  36.0 - 46.0 %   MCV 92.3  78.0 - 100.0 fL   MCH 30.7  26.0 - 34.0 pg   MCHC 33.2  30.0 - 36.0 g/dL   RDW 13.7  11.5 - 15.5 %   Platelets 221  150 - 400 K/uL  BASIC METABOLIC PANEL     Status: Abnormal   Collection Time    01/25/13  4:50 AM      Result Value Range   Sodium 142  137 - 147 mEq/L   Comment: Please note change in reference range.   Potassium 3.8  3.7 - 5.3 mEq/L   Comment: Please note change in reference range.   Chloride 106  96 - 112 mEq/L   CO2 20  19  - 32 mEq/L   Glucose, Bld 108 (*) 70 - 99 mg/dL   BUN 20  6 - 23 mg/dL   Creatinine, Ser 0.84  0.50 - 1.10 mg/dL   Calcium 9.6  8.4 - 10.5 mg/dL   GFR calc non Af Amer 62 (*) >90 mL/min   GFR calc Af Amer 71 (*) >90 mL/min   Comment: (NOTE)     The eGFR has been calculated using the CKD EPI equation.     This calculation has not been validated in all clinical situations.     eGFR's persistently <90 mL/min signify possible Chronic Kidney     Disease.  URINE RAPID DRUG SCREEN (HOSP PERFORMED)     Status: None   Collection Time    01/25/13 10:33 AM      Result Value Range   Opiates NONE DETECTED  NONE DETECTED   Cocaine NONE DETECTED  NONE DETECTED   Benzodiazepines NONE DETECTED  NONE DETECTED   Amphetamines NONE DETECTED  NONE DETECTED   Tetrahydrocannabinol NONE DETECTED  NONE DETECTED   Barbiturates NONE DETECTED  NONE DETECTED   Comment:            DRUG SCREEN FOR MEDICAL PURPOSES     ONLY.  IF CONFIRMATION IS NEEDED     FOR ANY PURPOSE, NOTIFY LAB     WITHIN 5 DAYS.                LOWEST DETECTABLE LIMITS     FOR URINE DRUG SCREEN     Drug Class       Cutoff (ng/mL)     Amphetamine      1000     Barbiturate      200     Benzodiazepine   858     Tricyclics       850     Opiates          300     Cocaine          300     THC              50  GLUCOSE, CAPILLARY     Status: None   Collection Time    01/25/13 11:19 AM      Result Value Range   Glucose-Capillary 93  70 - 99 mg/dL  GLUCOSE, CAPILLARY     Status: Abnormal   Collection Time  01/25/13  4:40 PM      Result Value Range   Glucose-Capillary 106 (*) 70 - 99 mg/dL   Comment 1 Notify RN     Comment 2 Documented in Chart      Dg Knee 1-2 Views Left  01/26/2013   CLINICAL DATA:  Knee pain. Fell. Postop 1 year. Question dislocation.  EXAM: LEFT KNEE - 1-2 VIEW  COMPARISON:  None.  FINDINGS: Post total left knee replacement. No evidence of fracture or dislocation noted on this two view examination.  Question soft  tissue prominence medial aspect of the left knee. Soft tissue injury not excluded.  IMPRESSION: Post total left knee replacement. No evidence of fracture or dislocation noted on this two view examination.  Question soft tissue prominence medial aspect of the left knee. Soft tissue injury not excluded.   Electronically Signed   By: Chauncey Cruel M.D.   On: 01/26/2013 07:09   Ct Head Wo Contrast  01/24/2013   CLINICAL DATA:  Altered mental status. Dementia. Worsening confusion.  EXAM: CT HEAD WITHOUT CONTRAST  TECHNIQUE: Contiguous axial images were obtained from the base of the skull through the vertex without intravenous contrast.  COMPARISON:  02/24/2012 MRI report only  FINDINGS: Sinuses/Soft tissues: Clear paranasal sinuses and mastoid air cells.  Intracranial: No mass lesion, hemorrhage, hydrocephalus, acute infarct, intra-axial, or extra-axial fluid collection. Mild-to-moderate low density in the periventricular white matter likely related to small vessel disease.  IMPRESSION: No acute intracranial abnormality.  Small vessel ischemic change.   Electronically Signed   By: Abigail Miyamoto M.D.   On: 01/24/2013 21:13    Review of Systems  Musculoskeletal: Positive for joint pain.       Left knee pain   Blood pressure 147/75, pulse 82, temperature 98.3 F (36.8 C), temperature source Oral, resp. rate 18, SpO2 94.00%. Physical Exam  Musculoskeletal:  Left knee without edema changes periarticular.  No effusion.  Soft tissue medial knee is abundant adipose tissue.  Patella tracts well with ROM  AROM 0-100 degrees without pain.  Pt able to quad set and SLR.  No laxity with valgus or varus stress.  Pt has discomfort with AP shift and there is laxity with anterior drawer sign with definitive endpoint. Tender medial and lateral joint line with palpation.  Tenderness is nonspecific to any particular area of anterior knee and no posterior tenderness.  Quad and patella tendons feel intact when pt tightens the  quad.      Assessment/Plan: Pt with non specific left knee pain in a left total knee replacement.  No current signs of infection or tendon ruptures. Weakness of quad which appears to be chronic   PLAN: recommend continuation of PT for quad strengthening exercises, ROM and mobility.  If she appears to have any other signs of instability may consider bracing.  However bracing wound inhibit adequate strengthening therefore will try PT first. At this point do not suspect a tendon rupture.  Pt seen and examined with DR Lorin Mercy today.  Paton Crum M 01/26/2013, 11:06 AM

## 2013-01-27 DIAGNOSIS — K219 Gastro-esophageal reflux disease without esophagitis: Secondary | ICD-10-CM

## 2013-01-27 MED ORDER — ARMODAFINIL 250 MG PO TABS: 250.0000 mg | ORAL_TABLET | Freq: Every morning | ORAL | Status: DC

## 2013-01-27 MED ORDER — BISACODYL 10 MG RE SUPP: 10.0000 mg | Freq: Every day | RECTAL | Status: DC | PRN

## 2013-01-27 MED ORDER — HYDROCODONE-ACETAMINOPHEN 5-325 MG PO TABS: 1.0000 | ORAL_TABLET | Freq: Four times a day (QID) | ORAL | Status: DC | PRN

## 2013-01-27 MED ORDER — BISACODYL 10 MG RE SUPP
10.0000 mg | Freq: Every day | RECTAL | Status: DC | PRN
  Filled 2013-01-27: qty 1

## 2013-01-27 MED ORDER — ASPIRIN 325 MG PO TABS: 325.0000 mg | ORAL_TABLET | Freq: Every day | ORAL | Status: DC

## 2013-01-27 NOTE — Progress Notes (Signed)
Patient is medically stable for D/C today. [facility] a [facility] [facility] before being admitted to the hospital. Clinical Social Worker (CSW) arranged D/C packet. Tiffany an Production designer, theatre/television/filmadministrator at [facility] reported that patient can come back to rehab today. Tiffany picked patient up and transported patient to [facility]. Patient called her nephew and made him aware that she is going back to [facility] today. Nursing is aware of above. Please reconsult if further social work needs arise. CSW signing off.   Jetta LoutBailey Morgan, LCSWA Weekend CSW [phone]

## 2013-01-27 NOTE — Discharge Summary (Signed)
Physician Discharge Summary  Bailey Duke VHQ:[id number] DOB: 01-06-1926 DOA: 01/24/2013  PCP: [name], MD  Admit date: 01/24/2013 Discharge date: 01/27/2013  Time spent: 40* minutes  Recommendations for Outpatient Follow-up:  1. Follow up PCP in 2 weeks , follow 2D echo results  Discharge Diagnoses:  Principal Problem:   TIA (transient ischemic attack) Active Problems:   Allergic rhinitis due to pollen   Allergic conjunctivitis   Obstructive sleep apnea   Hypothyroid   Hypertension   Depressive disorder   Anxiety   GERD (gastroesophageal reflux disease)   Discharge Condition: *Stable  Diet recommendation: *Low salt diet  Filed Weights   01/26/13 2224  Weight: 83.4 kg (183 lb 13.8 oz)    History of present illness:  80 y.o. Caucasian female with history of hypertension, obstructive sleep apnea on CPAP, depression, anxiety, GERD, and dementia who presents with the above complaints. Patient worse tangential, was difficult to reorient. Patient was in a rehabilitation center for 7 weeks after she had a fall, denies any fractures. She has been at independent living facility for the last week. Yesterday afternoon around 12, when men came to work at her garden she indicated that she had difficulty finding words for 10 minutes. She then went to sleep, reported that she go to dinner and had conversation with somebody without any difficulty. This morning she woke up and discussed her incident with somebody he instructed the patient to come to the emergency department for further evaluation. She denies any recent fevers, chills, nausea, vomiting, chest pain, abdominal pain, diarrhea, or vision changes. reports having shortness of breath for years which is not new. Hospitalist service was asked to admit the patient. Patient was seen in consultation with neurology   Hospital Course:  Expressive aphasia?/TIA  Resolved Patient admitted to telemetry, MRI/MRI of the brain negative for  acute infarct, carotid Dopplers are normal, and 2-D echocardiogram is pending. The patient was initiated on aspirin in the emergency department which will be continued. Hb A1c is 5.8. Most likely due to early  demetia  ? Dementia  Spoke to the patient's nephew , and he says that she has been confused intermittently over past few weeks. Patient will need testing for dementia as outpatient.  Knee pain  Xray of the knee is normal, but she may have ruptured the tendon.  Will get Ortho consult.   Hypertension  Stable. Continue home antihypertensive medications.   Obstructive sleep apnea  Continue CPAP.   Hypothyroidism  Continue levothyroxine.   Depression/anxiety  Continue home medications.   Chronic allergy  Continue home medications.   GERD  Stable.    Procedures:  echo    Consultations:  Neurology  Discharge Exam: Filed Vitals:   01/27/13 1329  BP: 132/66  Pulse: 72  Temp: 98.5 F (36.9 C)  Resp: 20    General: Appear in no acute distress Cardiovascular: *s1s2 RRR Respiratory: *Clear b/l  Discharge Instructions  Discharge Orders   Future Orders Complete By Expires   Diet - low sodium heart healthy  As directed    Increase activity slowly  As directed        Medication List    STOP taking these medications       OVER THE COUNTER MEDICATION     traMADol 50 MG tablet  Commonly known as:  ULTRAM      TAKE these medications       acetaminophen 325 MG tablet  Commonly known as:  TYLENOL  Take 650 mg by  mouth every 4 (four) hours as needed for mild pain or fever.     amLODipine 5 MG tablet  Commonly known as:  NORVASC  Take 5 mg by mouth every morning.     Armodafinil 250 MG tablet  Commonly known as:  NUVIGIL  Take 1 tablet (250 mg total) by mouth every morning.     aspirin 325 MG tablet  Take 1 tablet (325 mg total) by mouth daily.     BIOFREEZE 4 % Gel  Generic drug:  Menthol (Topical Analgesic)  Apply 1 application topically 3  (three) times daily as needed (for joint pain).     camphor-menthol lotion  Commonly known as:  SARNA  Apply 1 application topically 2 (two) times daily as needed for itching.     carvedilol 6.25 MG tablet  Commonly known as:  COREG  Take 6.25 mg by mouth 2 (two) times daily with a meal.     cetirizine 10 MG tablet  Commonly known as:  ZYRTEC  Take 10 mg by mouth daily as needed for allergies or rhinitis.     Curcumin Extract Powd  1 application by Does not apply route 3 (three) times daily.     cyanocobalamin 1000 MCG/ML injection  Commonly known as:  (VITAMIN B-12)  Inject 1,000 mcg into the muscle every 30 (thirty) days.     diclofenac sodium 1 % Gel  Commonly known as:  VOLTAREN  Apply 4 g topically every 6 (six) hours as needed (for lumbosacral spondylosis).     HYDROcodone-acetaminophen 5-325 MG per tablet  Commonly known as:  NORCO/VICODIN  Take 1 tablet by mouth every 6 (six) hours as needed for moderate pain.     levothyroxine 100 MCG tablet  Commonly known as:  SYNTHROID, LEVOTHROID  Take 100 mcg by mouth daily before breakfast.     losartan 100 MG tablet  Commonly known as:  COZAAR  Take 100 mg by mouth every morning.     magnesium oxide 400 MG tablet  Commonly known as:  MAG-OX  Take 400 mg by mouth at bedtime.     meclizine 25 MG tablet  Commonly known as:  ANTIVERT  Take 25 mg by mouth 3 (three) times daily as needed for dizziness.     MULTIVITAMIN PO  Take 1 tablet by mouth every morning.     omeprazole 20 MG capsule  Commonly known as:  PRILOSEC  Take 20 mg by mouth daily.     oxybutynin 5 MG 24 hr tablet  Commonly known as:  DITROPAN-XL  Take 5 mg by mouth at bedtime.     PENETRAN PLUS EX  Apply 1 application topically 3 (three) times daily as needed (for joint pain).     Phosphatidylserine 40 % Powd  Take 500 mg by mouth 3 (three) times daily.     vitamin C 500 MG tablet  Commonly known as:  ASCORBIC ACID  Take 1,000 mg by mouth every  morning.     Vitamin D 2000 UNITS Caps  Take 2,000 Units by mouth every morning.       Allergies  Allergen Reactions  . Penicillins   . Sulfa Antibiotics       The results of significant diagnostics from this hospitalization (including imaging, microbiology, ancillary and laboratory) are listed below for reference.    Significant Diagnostic Studies: Dg Knee 1-2 Views Left  Feb 15, 2013   CLINICAL DATA:  Knee pain. Fell. Postop 1 year. Question dislocation.  EXAM: LEFT KNEE -  1-2 VIEW  COMPARISON:  None.  FINDINGS: Post total left knee replacement. No evidence of fracture or dislocation noted on this two view examination.  Question soft tissue prominence medial aspect of the left knee. Soft tissue injury not excluded.  IMPRESSION: Post total left knee replacement. No evidence of fracture or dislocation noted on this two view examination.  Question soft tissue prominence medial aspect of the left knee. Soft tissue injury not excluded.   Electronically Signed   By: Bridgett Larsson M.D.   On: 01/26/2013 07:09   Ct Head Wo Contrast  01/24/2013   CLINICAL DATA:  Altered mental status. Dementia. Worsening confusion.  EXAM: CT HEAD WITHOUT CONTRAST  TECHNIQUE: Contiguous axial images were obtained from the base of the skull through the vertex without intravenous contrast.  COMPARISON:  02/24/2012 MRI report only  FINDINGS: Sinuses/Soft tissues: Clear paranasal sinuses and mastoid air cells.  Intracranial: No mass lesion, hemorrhage, hydrocephalus, acute infarct, intra-axial, or extra-axial fluid collection. Mild-to-moderate low density in the periventricular white matter likely related to small vessel disease.  IMPRESSION: No acute intracranial abnormality.  Small vessel ischemic change.   Electronically Signed   By: Jeronimo Greaves M.D.   On: 01/24/2013 21:13   Mri Brain Without Contrast  01/26/2013   CLINICAL DATA:  Worsening dementia/confusion. Recent history of knee surgery.  EXAM: MRI HEAD WITHOUT  CONTRAST  MRA HEAD WITHOUT CONTRAST  TECHNIQUE: Multiplanar, multiecho pulse sequences of the brain and surrounding structures were obtained without intravenous contrast. Angiographic images of the head were obtained using MRA technique without contrast.  COMPARISON:  CT of the head January 24, 2013 and MRI of the brain report February 23, 2010 though images are not available for direct comparison.  FINDINGS: MRI HEAD FINDINGS  The ventricles and sulci are normal for patient's age. Subcentimeter left greater than right thalami and bilateral basal ganglia T2 hyperintensities with minimal FLAIR hyperintense signal suggests lacunar infarcts. Patchy to confluent supratentorial white matter T2 hyperintensities are seen. No midline shift or mass effect. No reduced diffusion to suggest acute ischemia. No susceptibility artifact to suggest hemorrhage.  No abnormal extra-axial fluid collections. No extra-axial masses the contrast-enhanced sequences may be more sensitive.  Status post bilateral ocular lens implants. Paranasal sinuses and mastoid air cells appear well-aerated. Mild temporomandibular osteoarthrosis. No suspicious calvarial bone marrow signal. No cerebellar tonsillar ectopia.  MRA HEAD FINDINGS  Anterior circulation: Normal flow related enhancement of the included cervical, petrous, cavernous and supra clinoid internal carotid arteries. Normal flow related enhancement of the anterior and middle cerebral arteries, including more distal segments. Patent anterior communicating artery.  Posterior circulation: The right vertebral artery is dominant, there is overall for contrast enhancement of the posterior circulation which is likely related to tortuosity and to the mid basilar artery which appears normal in course, caliber and flow related enhancement. Main branch vessels are patent. Diminutive right P1 segment, with compensatory contribution from robust right greater than left posterior communicating arteries.  Normal flow related enhancement posterior cerebral arteries.  No large vessel occlusion, hemodynamically significant stenosis, suspicious luminal irregularity nor aneurysm within the posterior or anterior circulation. Minimal luminal irregularity within the anterior, middle and posterior cerebral arteries suggests intracranial atherosclerosis.  IMPRESSION: MRI of the head: No acute intracranial process. Involutional changes. Moderate white matter changes suggest chronic small vessel ischemic disease with bilateral basal ganglia and thalamus suspected lacunar infarcts.  MRA of the head: No hemodynamically significant stenosis. Complete circle of Willis. Minimal intracranial vessel irregularity most consistent  with atherosclerosis.   Electronically Signed   By: Awilda Metroourtnay  Bloomer   On: 01/26/2013 18:35   Mr Maxine GlennMra Head/brain Wo Cm  01/26/2013   CLINICAL DATA:  Worsening dementia/confusion. Recent history of knee surgery.  EXAM: MRI HEAD WITHOUT CONTRAST  MRA HEAD WITHOUT CONTRAST  TECHNIQUE: Multiplanar, multiecho pulse sequences of the brain and surrounding structures were obtained without intravenous contrast. Angiographic images of the head were obtained using MRA technique without contrast.  COMPARISON:  CT of the head January 24, 2013 and MRI of the brain report February 23, 2010 though images are not available for direct comparison.  FINDINGS: MRI HEAD FINDINGS  The ventricles and sulci are normal for patient's age. Subcentimeter left greater than right thalami and bilateral basal ganglia T2 hyperintensities with minimal FLAIR hyperintense signal suggests lacunar infarcts. Patchy to confluent supratentorial white matter T2 hyperintensities are seen. No midline shift or mass effect. No reduced diffusion to suggest acute ischemia. No susceptibility artifact to suggest hemorrhage.  No abnormal extra-axial fluid collections. No extra-axial masses the contrast-enhanced sequences may be more sensitive.  Status post  bilateral ocular lens implants. Paranasal sinuses and mastoid air cells appear well-aerated. Mild temporomandibular osteoarthrosis. No suspicious calvarial bone marrow signal. No cerebellar tonsillar ectopia.  MRA HEAD FINDINGS  Anterior circulation: Normal flow related enhancement of the included cervical, petrous, cavernous and supra clinoid internal carotid arteries. Normal flow related enhancement of the anterior and middle cerebral arteries, including more distal segments. Patent anterior communicating artery.  Posterior circulation: The right vertebral artery is dominant, there is overall for contrast enhancement of the posterior circulation which is likely related to tortuosity and to the mid basilar artery which appears normal in course, caliber and flow related enhancement. Main branch vessels are patent. Diminutive right P1 segment, with compensatory contribution from robust right greater than left posterior communicating arteries. Normal flow related enhancement posterior cerebral arteries.  No large vessel occlusion, hemodynamically significant stenosis, suspicious luminal irregularity nor aneurysm within the posterior or anterior circulation. Minimal luminal irregularity within the anterior, middle and posterior cerebral arteries suggests intracranial atherosclerosis.  IMPRESSION: MRI of the head: No acute intracranial process. Involutional changes. Moderate white matter changes suggest chronic small vessel ischemic disease with bilateral basal ganglia and thalamus suspected lacunar infarcts.  MRA of the head: No hemodynamically significant stenosis. Complete circle of Willis. Minimal intracranial vessel irregularity most consistent with atherosclerosis.   Electronically Signed   By: Awilda Metroourtnay  Bloomer   On: 01/26/2013 18:35    Microbiology: Recent Results (from the past 240 hour(s))  URINE CULTURE     Status: None   Collection Time    01/24/13  7:48 PM      Result Value Range Status   Specimen  Description URINE, CLEAN CATCH   Final   Special Requests NONE   Final   Culture  Setup Time     Final   Value: 01/24/2013 21:17     [facility]formed at [facility]   Colony Count     Final   Value: 50,000 COLONIES/ML     [facility]formed at [facility]   Culture     Final   Value: Multiple bacterial morphotypes present, none predominant. Suggest appropriate recollection if clinically indicated.     Performed at [facility]   Report Status 01/25/2013 FINAL   Final     Labs: Basic Metabolic Panel:  Recent Labs Lab 01/24/13 2008 01/25/13 0450  NA 140 142  K 4.5 3.8  CL 104  106  CO2 26 20  GLUCOSE 106* 108*  BUN 26* 20  CREATININE 1.06 0.84  CALCIUM 9.7 9.6   Liver Function Tests:  Recent Labs Lab 01/24/13 2008  AST 23  ALT 16  ALKPHOS 90  BILITOT <0.2*  PROT 6.1  ALBUMIN 3.4*   No results found for this basename: LIPASE, AMYLASE,  in the last 168 hours No results found for this basename: AMMONIA,  in the last 168 hours CBC:  Recent Labs Lab 01/24/13 2008 01/25/13 0450  WBC 9.6 12.4*  HGB 12.1 12.8  HCT 36.7 38.5  MCV 92.9 92.3  PLT 224 221   Cardiac Enzymes: No results found for this basename: CKTOTAL, CKMB, CKMBINDEX, TROPONINI,  in the last 168 hours BNP: BNP (last 3 results) No results found for this basename: PROBNP,  in the last 8760 hours CBG:  Recent Labs Lab 01/24/13 2018 01/25/13 1119 01/25/13 1640  GLUCAP 95 93 106*       Signed:  Jennika Ringgold S  [facility] 01/27/2013, 4:01 PM

## 2013-01-27 NOTE — Progress Notes (Signed)
Utilization Review completed.  

## 2013-01-27 NOTE — Procedures (Signed)
EEG report.  Brief clinical history:80 y.o. female presenting with difficulty with speech. No prior history of frank epileptic seizures. Cognitive decline that is reportedly progressive.  Technique: this is a 17 channel routine scalp EEG performed at the bedside with bipolar and monopolar montages arranged in accordance to the international 10/20 system of electrode placement. One channel was dedicated to EKG recording.  The study was performed during wakefulness and drowsiness. No activating procedures performed.  Description:In the wakeful state, the best background consisted of a medium amplitude, posterior dominant, well sustained, symmetric and reactive 8-9 Hz rhythm. Drowsiness demonstrated dropout of the alpha rhythm. No focal or generalized epileptiform discharges noted.  No slowing seen.  EKG showed sinus rhythm.  Impression: this is a normal awake and drowsy EEG. Please, be aware that a normal EEG does not exclude the possibility of epilepsy.  Clinical correlation is advised.  Wyatt Portelasvaldo Camilo, MD

## 2013-01-27 NOTE — Progress Notes (Signed)
NEURO HOSPITALIST PROGRESS NOTE   SUBJECTIVE:                                                                                                                        Complains of " something blocking my main and I can not complete my sentences". Stated that she is losing her train of thoughts more frequently and " there is something that is not right with my memory". Requesting B12 injections. MRI brain showed no acute abnormality, no hydrocephalus, no SDH, or stroking areas of leukoaraiosis. CUS normal. EEG normal. TTE results are pending. On aspirin 325 mg daily.     OBJECTIVE:                                                                                                                           Vital signs in last 24 hours: Temp:  [96.6 F (35.9 C)-98.7 F (37.1 C)] 97.9 F (36.6 C) (01/03 0905) Pulse Rate:  [65-76] 67 (01/03 0907) Resp:  [18-20] 20 (01/03 0905) BP: (130-162)/(68-117) 159/76 mmHg (01/03 0907) SpO2:  [96 %-98 %] 97 % (01/03 0905) Weight:  [83.4 kg (183 lb 13.8 oz)] 83.4 kg (183 lb 13.8 oz) (01/02 2224)  Intake/Output from previous day: 01/02 0701 - 01/03 0700 In: -  Out: 3 [Urine:3] Intake/Output this shift: Total I/O In: -  Out: 1 [Urine:1] Nutritional status: General  Past Medical History  Diagnosis Date  . Hypertension   . Sleep apnea   . Spinal stenosis   . Depressive disorder   . Anxiety   . Diaphragmatic hernia   . Dementia   . GERD (gastroesophageal reflux disease)     Neurologic Exam:  Status:  Alert, awake, oriented x 3. Does not recall seeing any other neurologist while in the hospital and cannot recall seeing Dr. Thad Ranger in consultation. Speech fluent without evidence of aphasia. Able to follow 3 step commands without difficulty.  Cranial Nerves:  II: Visual fields grossly normal, pupils equal, round, reactive to light and accommodation  III,IV, VI: ptosis not present, extra-ocular motions  intact bilaterally  V,VII: smile symmetric, facial light touch sensation normal bilaterally  VIII: hearing normal bilaterally  IX,X: gag reflex present  XI: bilateral shoulder shrug  XII:  midline tongue extension without atrophy or fasciculations  Motor:  Right : Upper extremity 5/5 Left: Upper extremity 5/5  Lower extremity 5/5 Lower extremity 4/5 due to pain  Tone and bulk:normal tone throughout; no atrophy noted  Sensory: Pinprick and light touch intact throughout, bilaterally  Deep Tendon Reflexes:  1+ in the upper extremities, knees not tested due to surgeries, absent AJ's bilaterally.  Plantars:  Right: downgoing Left: downgoing  Cerebellar:  normal finger-to-nose,   Lab Results: Lab Results  Component Value Date/Time   CHOL 211* 01/25/2013  4:50 AM   Lipid Panel  Recent Labs  01/25/13 0450  CHOL 211*  TRIG 86  HDL 69  CHOLHDL 3.1  VLDL 17  LDLCALC 811*    Studies/Results: Dg Knee 1-2 Views Left  01/26/2013   CLINICAL DATA:  Knee pain. Fell. Postop 1 year. Question dislocation.  EXAM: LEFT KNEE - 1-2 VIEW  COMPARISON:  None.  FINDINGS: Post total left knee replacement. No evidence of fracture or dislocation noted on this two view examination.  Question soft tissue prominence medial aspect of the left knee. Soft tissue injury not excluded.  IMPRESSION: Post total left knee replacement. No evidence of fracture or dislocation noted on this two view examination.  Question soft tissue prominence medial aspect of the left knee. Soft tissue injury not excluded.   Electronically Signed   By: Bridgett Larsson M.D.   On: 01/26/2013 07:09   Mri Brain Without Contrast  01/26/2013   CLINICAL DATA:  Worsening dementia/confusion. Recent history of knee surgery.  EXAM: MRI HEAD WITHOUT CONTRAST  MRA HEAD WITHOUT CONTRAST  TECHNIQUE: Multiplanar, multiecho pulse sequences of the brain and surrounding structures were obtained without intravenous contrast. Angiographic images of the head were  obtained using MRA technique without contrast.  COMPARISON:  CT of the head January 24, 2013 and MRI of the brain report February 23, 2010 though images are not available for direct comparison.  FINDINGS: MRI HEAD FINDINGS  The ventricles and sulci are normal for patient's age. Subcentimeter left greater than right thalami and bilateral basal ganglia T2 hyperintensities with minimal FLAIR hyperintense signal suggests lacunar infarcts. Patchy to confluent supratentorial white matter T2 hyperintensities are seen. No midline shift or mass effect. No reduced diffusion to suggest acute ischemia. No susceptibility artifact to suggest hemorrhage.  No abnormal extra-axial fluid collections. No extra-axial masses the contrast-enhanced sequences may be more sensitive.  Status post bilateral ocular lens implants. Paranasal sinuses and mastoid air cells appear well-aerated. Mild temporomandibular osteoarthrosis. No suspicious calvarial bone marrow signal. No cerebellar tonsillar ectopia.  MRA HEAD FINDINGS  Anterior circulation: Normal flow related enhancement of the included cervical, petrous, cavernous and supra clinoid internal carotid arteries. Normal flow related enhancement of the anterior and middle cerebral arteries, including more distal segments. Patent anterior communicating artery.  Posterior circulation: The right vertebral artery is dominant, there is overall for contrast enhancement of the posterior circulation which is likely related to tortuosity and to the mid basilar artery which appears normal in course, caliber and flow related enhancement. Main branch vessels are patent. Diminutive right P1 segment, with compensatory contribution from robust right greater than left posterior communicating arteries. Normal flow related enhancement posterior cerebral arteries.  No large vessel occlusion, hemodynamically significant stenosis, suspicious luminal irregularity nor aneurysm within the posterior or anterior  circulation. Minimal luminal irregularity within the anterior, middle and posterior cerebral arteries suggests intracranial atherosclerosis.  IMPRESSION: MRI of the head: No acute intracranial process. Involutional changes. Moderate white  matter changes suggest chronic small vessel ischemic disease with bilateral basal ganglia and thalamus suspected lacunar infarcts.  MRA of the head: No hemodynamically significant stenosis. Complete circle of Willis. Minimal intracranial vessel irregularity most consistent with atherosclerosis.   Electronically Signed   By: Awilda Metroourtnay  Bloomer   On: 01/26/2013 18:35   Mr Maxine GlennMra Head/brain Wo Cm  01/26/2013   CLINICAL DATA:  Worsening dementia/confusion. Recent history of knee surgery.  EXAM: MRI HEAD WITHOUT CONTRAST  MRA HEAD WITHOUT CONTRAST  TECHNIQUE: Multiplanar, multiecho pulse sequences of the brain and surrounding structures were obtained without intravenous contrast. Angiographic images of the head were obtained using MRA technique without contrast.  COMPARISON:  CT of the head January 24, 2013 and MRI of the brain report February 23, 2010 though images are not available for direct comparison.  FINDINGS: MRI HEAD FINDINGS  The ventricles and sulci are normal for patient's age. Subcentimeter left greater than right thalami and bilateral basal ganglia T2 hyperintensities with minimal FLAIR hyperintense signal suggests lacunar infarcts. Patchy to confluent supratentorial white matter T2 hyperintensities are seen. No midline shift or mass effect. No reduced diffusion to suggest acute ischemia. No susceptibility artifact to suggest hemorrhage.  No abnormal extra-axial fluid collections. No extra-axial masses the contrast-enhanced sequences may be more sensitive.  Status post bilateral ocular lens implants. Paranasal sinuses and mastoid air cells appear well-aerated. Mild temporomandibular osteoarthrosis. No suspicious calvarial bone marrow signal. No cerebellar tonsillar ectopia.   MRA HEAD FINDINGS  Anterior circulation: Normal flow related enhancement of the included cervical, petrous, cavernous and supra clinoid internal carotid arteries. Normal flow related enhancement of the anterior and middle cerebral arteries, including more distal segments. Patent anterior communicating artery.  Posterior circulation: The right vertebral artery is dominant, there is overall for contrast enhancement of the posterior circulation which is likely related to tortuosity and to the mid basilar artery which appears normal in course, caliber and flow related enhancement. Main branch vessels are patent. Diminutive right P1 segment, with compensatory contribution from robust right greater than left posterior communicating arteries. Normal flow related enhancement posterior cerebral arteries.  No large vessel occlusion, hemodynamically significant stenosis, suspicious luminal irregularity nor aneurysm within the posterior or anterior circulation. Minimal luminal irregularity within the anterior, middle and posterior cerebral arteries suggests intracranial atherosclerosis.  IMPRESSION: MRI of the head: No acute intracranial process. Involutional changes. Moderate white matter changes suggest chronic small vessel ischemic disease with bilateral basal ganglia and thalamus suspected lacunar infarcts.  MRA of the head: No hemodynamically significant stenosis. Complete circle of Willis. Minimal intracranial vessel irregularity most consistent with atherosclerosis.   Electronically Signed   By: Awilda Metroourtnay  Bloomer   On: 01/26/2013 18:35    MEDICATIONS                                                                                                                       I have reviewed the patient's current medications.  ASSESSMENT/PLAN:  80 y.o. female presenting with difficulty with speech. Otherwise  neurological examination is unremarkable. MRI/MRA/CUS/EEG unimpressive. TTE results pending. I suspect an early cognitive disorder as the reason for her language/speech dysfunction and thus will suggest complete evaluation for MCI/early dementia as outpatient. Continue aspirin. Neurology will sign off.  Wyatt Portela, MD [facility] [phone]  01/27/2013, 10:51 AM

## 2013-06-04 DIAGNOSIS — M48061 Spinal stenosis, lumbar region without neurogenic claudication: Secondary | ICD-10-CM | POA: Insufficient documentation

## 2013-06-04 DIAGNOSIS — M7581 Other shoulder lesions, right shoulder: Secondary | ICD-10-CM | POA: Insufficient documentation

## 2013-07-12 DIAGNOSIS — N183 Chronic kidney disease, stage 3 unspecified: Secondary | ICD-10-CM | POA: Insufficient documentation

## 2014-03-18 DIAGNOSIS — E785 Hyperlipidemia, unspecified: Secondary | ICD-10-CM | POA: Insufficient documentation

## 2014-07-26 DIAGNOSIS — I255 Ischemic cardiomyopathy: Secondary | ICD-10-CM

## 2014-07-26 DIAGNOSIS — I2510 Atherosclerotic heart disease of native coronary artery without angina pectoris: Secondary | ICD-10-CM

## 2014-07-26 HISTORY — PX: LEFT HEART CATH AND CORONARY ANGIOGRAPHY: CATH118249

## 2014-07-26 HISTORY — DX: Atherosclerotic heart disease of native coronary artery without angina pectoris: I25.10

## 2014-07-26 HISTORY — DX: Ischemic cardiomyopathy: I25.5

## 2014-09-15 ENCOUNTER — Emergency Department (HOSPITAL_COMMUNITY): Payer: Medicare Other

## 2014-09-15 ENCOUNTER — Observation Stay (HOSPITAL_COMMUNITY)
Admission: EM | Admit: 2014-09-15 | Discharge: 2014-09-17 | Payer: Medicare Other | Attending: Internal Medicine | Admitting: Internal Medicine

## 2014-09-15 ENCOUNTER — Encounter (HOSPITAL_COMMUNITY): Payer: Self-pay | Admitting: Radiology

## 2014-09-15 DIAGNOSIS — I10 Essential (primary) hypertension: Secondary | ICD-10-CM | POA: Diagnosis present

## 2014-09-15 DIAGNOSIS — H532 Diplopia: Secondary | ICD-10-CM | POA: Insufficient documentation

## 2014-09-15 DIAGNOSIS — N390 Urinary tract infection, site not specified: Principal | ICD-10-CM | POA: Insufficient documentation

## 2014-09-15 DIAGNOSIS — I5022 Chronic systolic (congestive) heart failure: Secondary | ICD-10-CM | POA: Diagnosis not present

## 2014-09-15 DIAGNOSIS — Z880 Allergy status to penicillin: Secondary | ICD-10-CM | POA: Diagnosis not present

## 2014-09-15 DIAGNOSIS — Z79899 Other long term (current) drug therapy: Secondary | ICD-10-CM | POA: Diagnosis not present

## 2014-09-15 DIAGNOSIS — F419 Anxiety disorder, unspecified: Secondary | ICD-10-CM | POA: Diagnosis not present

## 2014-09-15 DIAGNOSIS — Z8673 Personal history of transient ischemic attack (TIA), and cerebral infarction without residual deficits: Secondary | ICD-10-CM | POA: Diagnosis not present

## 2014-09-15 DIAGNOSIS — I2510 Atherosclerotic heart disease of native coronary artery without angina pectoris: Secondary | ICD-10-CM | POA: Insufficient documentation

## 2014-09-15 DIAGNOSIS — M4800 Spinal stenosis, site unspecified: Secondary | ICD-10-CM | POA: Diagnosis not present

## 2014-09-15 DIAGNOSIS — R4182 Altered mental status, unspecified: Secondary | ICD-10-CM | POA: Diagnosis not present

## 2014-09-15 DIAGNOSIS — Z9841 Cataract extraction status, right eye: Secondary | ICD-10-CM | POA: Insufficient documentation

## 2014-09-15 DIAGNOSIS — I639 Cerebral infarction, unspecified: Secondary | ICD-10-CM | POA: Diagnosis not present

## 2014-09-15 DIAGNOSIS — N179 Acute kidney failure, unspecified: Secondary | ICD-10-CM | POA: Diagnosis not present

## 2014-09-15 DIAGNOSIS — R410 Disorientation, unspecified: Secondary | ICD-10-CM | POA: Insufficient documentation

## 2014-09-15 DIAGNOSIS — Z887 Allergy status to serum and vaccine status: Secondary | ICD-10-CM | POA: Diagnosis not present

## 2014-09-15 DIAGNOSIS — Z9660 Presence of unspecified orthopedic joint implant: Secondary | ICD-10-CM | POA: Insufficient documentation

## 2014-09-15 DIAGNOSIS — Z882 Allergy status to sulfonamides status: Secondary | ICD-10-CM | POA: Insufficient documentation

## 2014-09-15 DIAGNOSIS — E860 Dehydration: Secondary | ICD-10-CM | POA: Insufficient documentation

## 2014-09-15 DIAGNOSIS — Z803 Family history of malignant neoplasm of breast: Secondary | ICD-10-CM | POA: Insufficient documentation

## 2014-09-15 DIAGNOSIS — Z8249 Family history of ischemic heart disease and other diseases of the circulatory system: Secondary | ICD-10-CM | POA: Insufficient documentation

## 2014-09-15 DIAGNOSIS — Z9842 Cataract extraction status, left eye: Secondary | ICD-10-CM | POA: Diagnosis not present

## 2014-09-15 DIAGNOSIS — Z7982 Long term (current) use of aspirin: Secondary | ICD-10-CM | POA: Diagnosis not present

## 2014-09-15 DIAGNOSIS — I340 Nonrheumatic mitral (valve) insufficiency: Secondary | ICD-10-CM | POA: Insufficient documentation

## 2014-09-15 DIAGNOSIS — E785 Hyperlipidemia, unspecified: Secondary | ICD-10-CM | POA: Diagnosis not present

## 2014-09-15 DIAGNOSIS — G4730 Sleep apnea, unspecified: Secondary | ICD-10-CM | POA: Insufficient documentation

## 2014-09-15 DIAGNOSIS — R471 Dysarthria and anarthria: Secondary | ICD-10-CM | POA: Insufficient documentation

## 2014-09-15 DIAGNOSIS — R4701 Aphasia: Secondary | ICD-10-CM | POA: Diagnosis not present

## 2014-09-15 DIAGNOSIS — G4733 Obstructive sleep apnea (adult) (pediatric): Secondary | ICD-10-CM | POA: Diagnosis not present

## 2014-09-15 DIAGNOSIS — R4781 Slurred speech: Secondary | ICD-10-CM | POA: Diagnosis not present

## 2014-09-15 DIAGNOSIS — I351 Nonrheumatic aortic (valve) insufficiency: Secondary | ICD-10-CM | POA: Diagnosis not present

## 2014-09-15 DIAGNOSIS — R300 Dysuria: Secondary | ICD-10-CM | POA: Insufficient documentation

## 2014-09-15 DIAGNOSIS — R9431 Abnormal electrocardiogram [ECG] [EKG]: Secondary | ICD-10-CM | POA: Insufficient documentation

## 2014-09-15 DIAGNOSIS — F0390 Unspecified dementia without behavioral disturbance: Secondary | ICD-10-CM | POA: Diagnosis not present

## 2014-09-15 DIAGNOSIS — R531 Weakness: Secondary | ICD-10-CM | POA: Insufficient documentation

## 2014-09-15 DIAGNOSIS — E559 Vitamin D deficiency, unspecified: Secondary | ICD-10-CM | POA: Diagnosis not present

## 2014-09-15 DIAGNOSIS — H538 Other visual disturbances: Secondary | ICD-10-CM | POA: Insufficient documentation

## 2014-09-15 DIAGNOSIS — G459 Transient cerebral ischemic attack, unspecified: Secondary | ICD-10-CM | POA: Diagnosis present

## 2014-09-15 DIAGNOSIS — K219 Gastro-esophageal reflux disease without esophagitis: Secondary | ICD-10-CM | POA: Insufficient documentation

## 2014-09-15 DIAGNOSIS — E039 Hypothyroidism, unspecified: Secondary | ICD-10-CM | POA: Diagnosis present

## 2014-09-15 HISTORY — DX: Overactive bladder: N32.81

## 2014-09-15 HISTORY — DX: Essential (primary) hypertension: I10

## 2014-09-15 HISTORY — DX: Unspecified osteoarthritis, unspecified site: M19.90

## 2014-09-15 HISTORY — DX: Benign paroxysmal vertigo, unspecified ear: H81.10

## 2014-09-15 HISTORY — DX: Hypersomnia, unspecified: G47.10

## 2014-09-15 HISTORY — DX: Hypothyroidism, unspecified: E03.9

## 2014-09-15 HISTORY — DX: Other non-diabetic proliferative retinopathy, left eye: H35.22

## 2014-09-15 HISTORY — DX: Hyperlipidemia, unspecified: E78.5

## 2014-09-15 HISTORY — DX: Vitamin D deficiency, unspecified: E55.9

## 2014-09-15 HISTORY — DX: Vitamin B12 deficiency anemia, unspecified: D51.9

## 2014-09-15 HISTORY — DX: Unspecified systolic (congestive) heart failure: I50.20

## 2014-09-15 LAB — COMPREHENSIVE METABOLIC PANEL
ALBUMIN: 3.7 g/dL (ref 3.5–5.0)
ALK PHOS: 66 U/L (ref 38–126)
ALT: 23 U/L (ref 14–54)
AST: 30 U/L (ref 15–41)
Anion gap: 8 (ref 5–15)
BUN: 33 mg/dL — AB (ref 6–20)
CALCIUM: 10.3 mg/dL (ref 8.9–10.3)
CHLORIDE: 106 mmol/L (ref 101–111)
CO2: 22 mmol/L (ref 22–32)
CREATININE: 1.32 mg/dL — AB (ref 0.44–1.00)
GFR calc non Af Amer: 35 mL/min — ABNORMAL LOW (ref >60)
GFR, EST AFRICAN AMERICAN: 41 mL/min — AB (ref >60)
GLUCOSE: 123 mg/dL — AB (ref 65–99)
Potassium: 4.8 mmol/L (ref 3.5–5.1)
SODIUM: 136 mmol/L (ref 135–145)
Total Bilirubin: 0.6 mg/dL (ref 0.3–1.2)
Total Protein: 6.4 g/dL — ABNORMAL LOW (ref 6.5–8.1)

## 2014-09-15 LAB — APTT: APTT: 25 s (ref 24–37)

## 2014-09-15 LAB — DIFFERENTIAL
BASOS ABS: 0 10*3/uL (ref 0.0–0.1)
Basophils Relative: 0 % (ref 0–1)
Eosinophils Absolute: 0.2 10*3/uL (ref 0.0–0.7)
Eosinophils Relative: 2 % (ref 0–5)
LYMPHS PCT: 21 % (ref 12–46)
Lymphs Abs: 1.8 10*3/uL (ref 0.7–4.0)
MONO ABS: 0.5 10*3/uL (ref 0.1–1.0)
Monocytes Relative: 5 % (ref 3–12)
NEUTROS ABS: 6.3 10*3/uL (ref 1.7–7.7)
Neutrophils Relative %: 72 % (ref 43–77)

## 2014-09-15 LAB — I-STAT CHEM 8, ED
BUN: 38 mg/dL — AB (ref 6–20)
CHLORIDE: 106 mmol/L (ref 101–111)
CREATININE: 1.2 mg/dL — AB (ref 0.44–1.00)
Calcium, Ion: 1.29 mmol/L (ref 1.13–1.30)
GLUCOSE: 123 mg/dL — AB (ref 65–99)
HEMATOCRIT: 37 % (ref 36.0–46.0)
HEMOGLOBIN: 12.6 g/dL (ref 12.0–15.0)
POTASSIUM: 4.6 mmol/L (ref 3.5–5.1)
Sodium: 137 mmol/L (ref 135–145)
TCO2: 20 mmol/L (ref 0–100)

## 2014-09-15 LAB — CBC
HEMATOCRIT: 35.2 % — AB (ref 36.0–46.0)
Hemoglobin: 11.8 g/dL — ABNORMAL LOW (ref 12.0–15.0)
MCH: 31 pg (ref 26.0–34.0)
MCHC: 33.5 g/dL (ref 30.0–36.0)
MCV: 92.4 fL (ref 78.0–100.0)
PLATELETS: 199 10*3/uL (ref 150–400)
RBC: 3.81 MIL/uL — AB (ref 3.87–5.11)
RDW: 13.6 % (ref 11.5–15.5)
WBC: 8.7 10*3/uL (ref 4.0–10.5)

## 2014-09-15 LAB — I-STAT TROPONIN, ED: Troponin i, poc: 0.01 ng/mL (ref 0.00–0.08)

## 2014-09-15 LAB — PROTIME-INR
INR: 1.08 (ref 0.00–1.49)
PROTHROMBIN TIME: 14.2 s (ref 11.6–15.2)

## 2014-09-15 LAB — CBG MONITORING, ED: Glucose-Capillary: 105 mg/dL — ABNORMAL HIGH (ref 65–99)

## 2014-09-15 MED ORDER — ASPIRIN EC 81 MG PO TBEC
81.0000 mg | DELAYED_RELEASE_TABLET | Freq: Every day | ORAL | Status: DC
  Administered 2014-09-15 – 2014-09-17 (×2): 81 mg via ORAL
  Filled 2014-09-15 (×2): qty 1

## 2014-09-15 NOTE — ED Notes (Signed)
Given happy meal. 

## 2014-09-15 NOTE — ED Notes (Signed)
Returned from mri and placed back on monitor. 

## 2014-09-15 NOTE — ED Provider Notes (Signed)
The patient is an elderly 81 year old female who comes from nursing facility after she was found to be altered. This has been going on for at least 5 hours prior to arrival. They reported potential weakness, potential slurred speech, the paramedics noted that she had some difficulty with facial droop intermittently. The patient is unable to give a very clear history. There is possible dementia reported per EMS.  On exam the patient has normal strength in all 4 extremities, normal grips, can lift both legs off the bed. No obvious facial droop, speech is intermittently slurred but this is not a constant finding, she giggles inappropriately, has normal extraocular movements though she does report some intermittent diplopia.  Discussed the case with the radiologist, no signs of acute stroke, discussed the care with the neurologist, NIH scale of 4 however he does not believe that this would benefit from thrombolytic therapy as it is not a focal stroke like syndrome and is more encephalopathic. We'll complete lab workup and admitted for altered mental status.   EKG Interpretation  Date/Time:  Sunday September 15 2014 19:12:09 EDT Ventricular Rate:  91 PR Interval:  46 QRS Duration: 91 QT Interval:  364 QTC Calculation: 448 R Axis:   71 Text Interpretation:  Sinus rhythm Short PR interval Anteroseptal infarct, age indeterminate Minimal ST elevation, inferior leads Since last tracing ST abnormalities now seen. Abnormal ekg Confirmed by Hyacinth Meeker  MD, Karee Christopherson ([id number]) on 09/15/2014 7:36:33 PM       Medical screening examination/treatment/procedure(s) were conducted as a shared visit with non-physician practitioner(s) and myself.  I personally evaluated the patient during the encounter.  Clinical Impression:   Final diagnoses:  Transient cerebral ischemia, unspecified transient cerebral ischemia type         Eber Hong, MD 09/15/14 2342

## 2014-09-15 NOTE — ED Notes (Signed)
Patient transported to MRI 

## 2014-09-15 NOTE — Consult Note (Signed)
Referring Physician: ED    Chief Complaint: code stroke, acute onset dysarthria, double vision, shaking, generalikzed weakness  HPI:                                                                                                                                         Bailey Duke is an 81 y.o. female with a past medical history that is relevant for HTN, OSA, depression, anxiety, dementia, brought in by EMS for evaluation of the above mentioned symptoms. Patient lives at an assisted facility and said that today after eating lunch her body became weak and shaky, her speech changed and she couldn't walk to the elevator. EMS was summoned and found her altered with complains of double vision and weak all over. Denies associated HA, vertigo, difficulty swallowing, or visual changes. NIHSS 4. CT brain showed no acute abnormality. Available serologies reviewed and are not particularly revealing. Date last known well: 09/15/14 Time last known well: 1:45 pm tPA Given: no, late presentation NIHSS: 4 MRS: 0  Past Medical History  Diagnosis Date  . Hypertension   . Sleep apnea   . Spinal stenosis   . Depressive disorder   . Anxiety   . Diaphragmatic hernia   . Dementia   . GERD (gastroesophageal reflux disease)     Past Surgical History  Procedure Laterality Date  . Joint replacement      Family History  Problem Relation Age of Onset  . Breast cancer Mother   . Other Father     Hardening of the arteries to the head   Social History:  reports that she has never smoked. She does not have any smokeless tobacco history on file. She reports that she drinks about 1.2 oz of alcohol per week. She reports that she does not use illicit drugs. Family history: no MS, brain tumor, or brain aneurysm Allergies:  Allergies  Allergen Reactions  . Tetanus Toxoids Other (See Comments)    RED LINE FROM INJECTION SITE DOWN ARM  . Penicillins Other (See Comments)    1940 FORMS - TINY ITCHY PIMPLES ON  FINGERS  . Procaine Other (See Comments)    ?    Marland Kitchen Sulfa Antibiotics     Medications:  I have reviewed the patient's current medications.  ROS:                                                                                                                                       History obtained from chart review  General ROS: negative for - chills, fatigue, fever, night sweats, weight gain or weight loss Psychological ROS: negative for - behavioral disorder, or suicidal ideation Ophthalmic ROS: negative for - blurry vision, eye pain or loss of vision ENT ROS: negative for - epistaxis, nasal discharge, oral lesions, sore throat, tinnitus or vertigo Allergy and Immunology ROS: negative for - hives or itchy/watery eyes Hematological and Lymphatic ROS: negative for - bleeding problems, bruising or swollen lymph nodes Endocrine ROS: negative for - galactorrhea, hair pattern changes, polydipsia/polyuria or temperature intolerance Respiratory ROS: negative for - cough, hemoptysis, shortness of breath or wheezing Cardiovascular ROS: negative for - chest pain, dyspnea on exertion, edema or irregular heartbeat Gastrointestinal ROS: negative for - abdominal pain, diarrhea, hematemesis, nausea/vomiting or stool incontinence Genito-Urinary ROS: negative for - dysuria, hematuria, incontinence or urinary frequency/urgency Musculoskeletal ROS: negative for - joint swelling Neurological ROS: as noted in HPI Dermatological ROS: negative for rash and skin lesion changes    Physical exam:  Constitutional: well developed, pleasant female in no apparent distress. Blood pressure 162/80, pulse 92, temperature 98.1 F (36.7 C), temperature source Oral, resp. rate 19, weight 91.1 kg (200 lb 13.4 oz), SpO2 92 %. Eyes: no jaundice or exophthalmos.  Head: normocephalic. Neck: supple, no  bruits, no JVD. Cardiac: no murmurs. Lungs: clear. Abdomen: soft, no tender, no mass. Extremities: no edema, clubbing, or cyanosis.  Skin: no rash   Neurologic Examination:                                                                                                      General: Mental Status: Alert, oriented, thought content appropriate. Mild dysarthria without evidence of aphasia.  Able to follow 3 step commands without difficulty. Cranial Nerves: II: Discs flat bilaterally; Visual fields grossly normal, pupils equal, round, reactive to light and accommodation III,IV, VI: ptosis not present, extra-ocular motions intact bilaterally V,VII: smile symmetric, facial light touch sensation normal bilaterally VIII: hearing normal bilaterally IX,X: uvula rises symmetrically XI: bilateral shoulder shrug XII: midline tongue extension without atrophy or fasciculations  Motor: Mild drift bilateral LE Tone and bulk:normal tone throughout; no atrophy noted Sensory: Pinprick and light touch diminished in he left Deep Tendon Reflexes:  1+ all over Plantars: Right: downgoing   Left: downgoing Cerebellar: Normal finger-to-nose bilaterally but impaired heel-to-shin test L>R Gait:  Unable to test due to weakness and multiple leads.    Results for orders placed or performed during the hospital encounter of 09/15/14 (from the past 48 hour(s))  I-stat troponin, ED (not at [facility], [facility])     Status: None   Collection Time: 09/15/14  7:03 PM  Result Value Ref Range   Troponin i, poc 0.01 0.00 - 0.08 ng/mL   Comment 3            Comment: Due to the release kinetics of cTnI, a negative result within the first hours of the onset of symptoms does not rule out myocardial infarction with certainty. If myocardial infarction is still suspected, repeat the test at appropriate intervals.   I-Stat Chem 8, ED  (not at [facility], [facility])     Status: Abnormal   Collection Time: 09/15/14  7:04 PM  Result Value  Ref Range   Sodium 137 135 - 145 mmol/L   Potassium 4.6 3.5 - 5.1 mmol/L   Chloride 106 101 - 111 mmol/L   BUN 38 (H) 6 - 20 mg/dL   Creatinine, Ser 1.20 (H) 0.44 - 1.00 mg/dL   Glucose, Bld 123 (H) 65 - 99 mg/dL   Calcium, Ion 1.29 1.13 - 1.30 mmol/L   TCO2 20 0 - 100 mmol/L   Hemoglobin 12.6 12.0 - 15.0 g/dL   HCT 37.0 36.0 - 46.0 %  Protime-INR     Status: None   Collection Time: 09/15/14  7:14 PM  Result Value Ref Range   Prothrombin Time 14.2 11.6 - 15.2 seconds   INR 1.08 0.00 - 1.49  APTT     Status: None   Collection Time: 09/15/14  7:14 PM  Result Value Ref Range   aPTT 25 24 - 37 seconds  CBC     Status: Abnormal   Collection Time: 09/15/14  7:14 PM  Result Value Ref Range   WBC 8.7 4.0 - 10.5 K/uL   RBC 3.81 (L) 3.87 - 5.11 MIL/uL   Hemoglobin 11.8 (L) 12.0 - 15.0 g/dL   HCT 35.2 (L) 36.0 - 46.0 %   MCV 92.4 78.0 - 100.0 fL   MCH 31.0 26.0 - 34.0 pg   MCHC 33.5 30.0 - 36.0 g/dL   RDW 13.6 11.5 - 15.5 %   Platelets 199 150 - 400 K/uL  Differential     Status: None   Collection Time: 09/15/14  7:14 PM  Result Value Ref Range   Neutrophils Relative % 72 43 - 77 %   Neutro Abs 6.3 1.7 - 7.7 K/uL   Lymphocytes Relative 21 12 - 46 %   Lymphs Abs 1.8 0.7 - 4.0 K/uL   Monocytes Relative 5 3 - 12 %   Monocytes Absolute 0.5 0.1 - 1.0 K/uL   Eosinophils Relative 2 0 - 5 %   Eosinophils Absolute 0.2 0.0 - 0.7 K/uL   Basophils Relative 0 0 - 1 %   Basophils Absolute 0.0 0.0 - 0.1 K/uL  Comprehensive metabolic panel     Status: Abnormal   Collection Time: 09/15/14  7:14 PM  Result Value Ref Range   Sodium 136 135 - 145 mmol/L   Potassium 4.8 3.5 - 5.1 mmol/L   Chloride 106 101 - 111 mmol/L   CO2 22 22 - 32 mmol/L   Glucose, Bld 123 (H) 65 - 99 mg/dL  BUN 33 (H) 6 - 20 mg/dL   Creatinine, Ser 1.32 (H) 0.44 - 1.00 mg/dL   Calcium 10.3 8.9 - 10.3 mg/dL   Total Protein 6.4 (L) 6.5 - 8.1 g/dL   Albumin 3.7 3.5 - 5.0 g/dL   AST 30 15 - 41 U/L   ALT 23 14 - 54 U/L    Alkaline Phosphatase 66 38 - 126 U/L   Total Bilirubin 0.6 0.3 - 1.2 mg/dL   GFR calc non Af Amer 35 (L) >60 mL/min   GFR calc Af Amer 41 (L) >60 mL/min    Comment: (NOTE) The eGFR has been calculated using the CKD EPI equation. This calculation has not been validated in all clinical situations. eGFR's persistently <60 mL/min signify possible Chronic Kidney Disease.    Anion gap 8 5 - 15  CBG monitoring, ED     Status: Abnormal   Collection Time: 09/15/14  7:14 PM  Result Value Ref Range   Glucose-Capillary 105 (H) 65 - 99 mg/dL   Ct Head Wo Contrast  09/15/2014   CLINICAL DATA:  Generalize weakness, slurred speech since 1345 hr  EXAM: CT HEAD WITHOUT CONTRAST  TECHNIQUE: Contiguous axial images were obtained from the base of the skull through the vertex without intravenous contrast.  COMPARISON:  MRI brain 01/26/2013.  CT head 01/24/2013.  FINDINGS: Diffuse cerebral atrophy. No ventricular dilatation. Scattered low-attenuation change in the white matter consistent with small vessel ischemic change. No mass effect or midline shift. No abnormal extra-axial fluid collections. Gray-white matter junctions are distinct. Basal cisterns are not effaced. No evidence of acute intracranial hemorrhage. No depressed skull fractures. Visualized paranasal sinuses and mastoid air cells are not opacified. Vascular calcifications.  IMPRESSION: No acute intracranial abnormalities. Mild chronic atrophy and small vessel ischemic changes.  These results were called by telephone at the time of interpretation on 09/15/2014 at 7:24 pm to Dr. Sabra Heck, who verbally acknowledged these results.   Electronically Signed   By: Lucienne Capers M.D.   On: 09/15/2014 19:31    Assessment: 81 y.o. female brought in as a code stroke due to acute onset acute onset dysarthria, double vision, shaking, generalized weakness. NIHSS 4. CT brain without acute abnomality. Suspect subcortical posterior circulation infarct. She is out of the  window for thrombolysis. Admit to medicine. Ordered complete stroke work up. Stroke team will follow up tomorrow.  Stroke Risk Factors - age, HTN  Plan: 1. HgbA1c, fasting lipid panel 2. MRI, MRA  of the brain without contrast 3. Echocardiogram 4. Carotid dopplers 5. Prophylactic therapy-aspirin after passing swallowing eval 6. Risk factor modification 7. Telemetry monitoring 8. Frequent neuro checks 9. PT/OT SLP  Dorian Pod, MD [facility] [phone]  09/15/2014, 9:20 PM

## 2014-09-15 NOTE — ED Provider Notes (Signed)
CSN: [id number]     Arrival date & time 09/15/14  1851 History   First MD Initiated Contact with Patient 09/15/14 1856     Chief Complaint  Patient presents with  . Aphasia  . Eye Problem  . Code Stroke    HPI  Bailey Duke is an 81 year old female with PMHx of TIA in 2014 presenting with AMS. She is coming from an assisted living facility after she was found altered after lunch. EMS reported slurred speech and facial droop during transfer. She complains of shaking and generalized weakness that started earlier today around 1 PM. No falls reported. She is complaining of double vision and blurred vision but states that this has been a problem for months and she sees an eye doctor for. Denies fevers, headaches, chest pain, SOB, nausea or vomiting.   Interview is difficult 2/2 AMS and unable to get a clear history. Inappropriate laughing throughout. Tangential thought process. Very difficult to get answers to direct questions.    Past Medical History  Diagnosis Date  . Hypertension   . Sleep apnea   . Spinal stenosis   . Depressive disorder   . Anxiety   . Diaphragmatic hernia   . Dementia   . GERD (gastroesophageal reflux disease)    Past Surgical History  Procedure Laterality Date  . Joint replacement     Family History  Problem Relation Age of Onset  . Breast cancer Mother   . Other Father     Hardening of the arteries to the head   Social History  Substance Use Topics  . Smoking status: Never Smoker   . Smokeless tobacco: None  . Alcohol Use: 1.2 oz/week    2 Shots of liquor per week   OB History    No data available     Review of Systems  Constitutional: Negative for fever.  Eyes: Positive for visual disturbance.  Respiratory: Negative for shortness of breath.   Cardiovascular: Negative for chest pain.  Gastrointestinal: Negative for nausea, vomiting and abdominal pain.  Genitourinary: Negative for dysuria.  Musculoskeletal: Negative for myalgias.  Neurological:  Positive for tremors, speech difficulty and weakness. Negative for dizziness, syncope, light-headedness and headaches.      Allergies  Tetanus toxoids; Penicillins; Procaine; and Sulfa antibiotics  Home Medications   Prior to Admission medications   Medication Sig Start Date End Date Taking? Authorizing Provider  aspirin EC 81 MG tablet Take 81 mg by mouth daily. 03/06/14  Yes Historical Provider, MD  atorvastatin (LIPITOR) 20 MG tablet Take 20 mg by mouth daily. 08/19/14 08/19/15 Yes Historical Provider, MD  fluticasone (FLONASE) 50 MCG/ACT nasal spray Place 1 spray into both nostrils daily. 03/05/14 03/05/15 Yes Historical Provider, MD  furosemide (LASIX) 20 MG tablet Take 40 mg by mouth daily. 08/02/14 08/02/15 Yes Historical Provider, MD  gabapentin (NEURONTIN) 300 MG capsule Take 1 capsule in the morning and 2 capsules in the evening. 03/18/14 03/18/16 Yes Historical Provider, MD  modafinil (PROVIGIL) 200 MG tablet Take 200 mg by mouth daily. 08/26/14  Yes Historical Provider, MD  RA KRILL OIL 500 MG CAPS Take 1 capsule by mouth daily. 04/01/14  Yes Historical Provider, MD  spironolactone (ALDACTONE) 25 MG tablet Take 25 mg by mouth daily. 03/06/14  Yes Historical Provider, MD  traMADol (ULTRAM) 50 MG tablet Take 50 mg by mouth daily. 03/18/14 03/18/15 Yes Historical Provider, MD  acetaminophen (TYLENOL) 325 MG tablet Take 650 mg by mouth every 4 (four) hours as needed  for mild pain or fever.    Historical Provider, MD  amLODipine (NORVASC) 5 MG tablet Take 5 mg by mouth every morning.    Historical Provider, MD  Armodafinil (NUVIGIL) 250 MG tablet Take 1 tablet (250 mg total) by mouth every morning. 01/27/13   Meredeth Ide, MD  aspirin 325 MG tablet Take 1 tablet (325 mg total) by mouth daily. 01/27/13   Meredeth Ide, MD  camphor-menthol [facility]) lotion Apply 1 application topically 2 (two) times daily as needed for itching.    Historical Provider, MD  carvedilol (COREG) 6.25 MG tablet Take 6.25 mg by mouth 2  (two) times daily with a meal.    Historical Provider, MD  cetirizine (ZYRTEC) 10 MG tablet Take 10 mg by mouth daily as needed for allergies or rhinitis.    Historical Provider, MD  Cholecalciferol (VITAMIN D) 2000 UNITS CAPS Take 2,000 Units by mouth every morning.    Historical Provider, MD  cyanocobalamin (,VITAMIN B-12,) 1000 MCG/ML injection Inject 1,000 mcg into the muscle every 30 (thirty) days.    Historical Provider, MD  diclofenac sodium (VOLTAREN) 1 % GEL Apply 4 g topically every 6 (six) hours as needed (for lumbosacral spondylosis).    Historical Provider, MD  HYDROcodone-acetaminophen (NORCO/VICODIN) 5-325 MG per tablet Take 1 tablet by mouth every 6 (six) hours as needed for moderate pain. 01/27/13   Meredeth Ide, MD  levothyroxine (SYNTHROID, LEVOTHROID) 100 MCG tablet Take 100 mcg by mouth daily before breakfast.    Historical Provider, MD  Liniments (PENETRAN PLUS EX) Apply 1 application topically 3 (three) times daily as needed (for joint pain).    Historical Provider, MD  losartan (COZAAR) 100 MG tablet Take 100 mg by mouth every morning.    Historical Provider, MD  magnesium oxide (MAG-OX) 400 MG tablet Take 400 mg by mouth at bedtime.    Historical Provider, MD  meclizine (ANTIVERT) 25 MG tablet Take 25 mg by mouth 3 (three) times daily as needed for dizziness.    Historical Provider, MD  Menthol, Topical Analgesic, (BIOFREEZE) 4 % GEL Apply 1 application topically 3 (three) times daily as needed (for joint pain).    Historical Provider, MD  Multiple Vitamins-Minerals (MULTIVITAMIN PO) Take 1 tablet by mouth every morning.    Historical Provider, MD  omeprazole (PRILOSEC) 20 MG capsule Take 20 mg by mouth daily.    Historical Provider, MD  oxybutynin (DITROPAN-XL) 5 MG 24 hr tablet Take 5 mg by mouth at bedtime.    Historical Provider, MD  RANEXA 500 MG 12 hr tablet Take 500 mg by mouth 2 (two) times daily. 08/19/14   Historical Provider, MD  ranolazine (RANEXA) 1000 MG SR tablet  Take 1,000 mg by mouth 2 (two) times daily. 08/02/14 08/02/15  Historical Provider, MD  Soybean Lecithin (PHOSPHATIDYLSERINE) 40 % POWD Take 500 mg by mouth 3 (three) times daily.    Historical Provider, MD  Turmeric (RA TURMERIC) 500 MG CAPS Take 500 mg by mouth daily.    Historical Provider, MD  Turmeric, Curcuma [place], (CURCUMIN EXTRACT) POWD 1 application by Does not apply route 3 (three) times daily.    Historical Provider, MD  vitamin C (ASCORBIC ACID) 500 MG tablet Take 1,000 mg by mouth every morning.    Historical Provider, MD   BP 151/73 mmHg  Pulse 84  Temp(Src) 98.1 F (36.7 C) (Oral)  Resp 12  Wt 200 lb 13.4 oz (91.1 kg)  SpO2 97% Physical Exam  Constitutional: She  is oriented to person, place, and time. She appears well-developed and well-nourished. No distress.  Easily distracted during interview and does not answer questions posed to her. Inappropriate laughing throughout.   HENT:  Head: Normocephalic and atraumatic.  Eyes: Conjunctivae and EOM are normal. Pupils are equal, round, and reactive to light.  Cardiovascular: Normal rate, regular rhythm and normal heart sounds.   Pulmonary/Chest: Effort normal and breath sounds normal. No respiratory distress.  Abdominal: Soft. There is no tenderness.  Neurological: She is alert and oriented to person, place, and time. She has normal strength. No cranial nerve deficit or sensory deficit.  Skin: Skin is warm and dry.  Psychiatric: Her speech is tangential.  Vitals reviewed.   ED Course  Procedures (including critical care time) Labs Review Labs Reviewed  CBC - Abnormal; Notable for the following:    RBC 3.81 (*)    Hemoglobin 11.8 (*)    HCT 35.2 (*)    All other components within normal limits  COMPREHENSIVE METABOLIC PANEL - Abnormal; Notable for the following:    Glucose, Bld 123 (*)    BUN 33 (*)    Creatinine, Ser 1.32 (*)    Total Protein 6.4 (*)    GFR calc non Af Amer 35 (*)    GFR calc Af Amer 41 (*)    All  other components within normal limits  CBG MONITORING, ED - Abnormal; Notable for the following:    Glucose-Capillary 105 (*)    All other components within normal limits  I-STAT CHEM 8, ED - Abnormal; Notable for the following:    BUN 38 (*)    Creatinine, Ser 1.20 (*)    Glucose, Bld 123 (*)    All other components within normal limits  PROTIME-INR  APTT  DIFFERENTIAL  URINALYSIS, ROUTINE W REFLEX MICROSCOPIC (NOT AT [facility])  HEMOGLOBIN A1C  LIPID PANEL  I-STAT TROPOININ, ED    Imaging Review Dg Chest 2 View  09/15/2014   CLINICAL DATA:  Altered mental status kid patient reports feeling bad and shaking uncontrolled knee for the last 2 days. History of CHF and hypertension.  EXAM: CHEST  2 VIEW  COMPARISON:  07/30/2014  FINDINGS: Mild enlargement of the cardiopericardial silhouette is stable. No mediastinal or hilar masses or evidence of adenopathy.  There are prominent interstitial markings bilaterally, stable. Mild linear scarring is noted in the left mid lung, also stable. No lung consolidation or edema. No pleural effusion or pneumothorax.  Skeletal structures are diffusely demineralized. There arthropathic changes of the right glenohumeral joint, stable.  IMPRESSION: 1. No acute cardiopulmonary disease. 2. Stable cardiomegaly.   Electronically Signed   By: Amie Portland M.D.   On: 09/15/2014 21:57   Ct Head Wo Contrast  09/15/2014   CLINICAL DATA:  Generalize weakness, slurred speech since 1345 hr  EXAM: CT HEAD WITHOUT CONTRAST  TECHNIQUE: Contiguous axial images were obtained from the base of the skull through the vertex without intravenous contrast.  COMPARISON:  MRI brain 01/26/2013.  CT head 01/24/2013.  FINDINGS: Diffuse cerebral atrophy. No ventricular dilatation. Scattered low-attenuation change in the white matter consistent with small vessel ischemic change. No mass effect or midline shift. No abnormal extra-axial fluid collections. Gray-white matter junctions are distinct. Basal  cisterns are not effaced. No evidence of acute intracranial hemorrhage. No depressed skull fractures. Visualized paranasal sinuses and mastoid air cells are not opacified. Vascular calcifications.  IMPRESSION: No acute intracranial abnormalities. Mild chronic atrophy and small vessel ischemic changes.  These  results were called by telephone at the time of interpretation on 09/15/2014 at 7:24 pm to Dr. Hyacinth Meeker, who verbally acknowledged these results.   Electronically Signed   By: Burman Nieves M.D.   On: 09/15/2014 19:31   I have personally reviewed and evaluated these images and lab results as part of my medical decision-making.   EKG Interpretation   Date/Time:  Sunday September 15 2014 19:12:09 EDT Ventricular Rate:  91 PR Interval:  46 QRS Duration: 91 QT Interval:  364 QTC Calculation: 448 R Axis:   71 Text Interpretation:  Sinus rhythm Short PR interval Anteroseptal infarct,  age indeterminate Minimal ST elevation, inferior leads Since last tracing  ST abnormalities now seen. Abnormal ekg Confirmed by MILLER  MD, BRIAN  [phone]) on 09/15/2014 7:36:33 PM      MDM   Final diagnoses:  Transient cerebral ischemia, unspecified transient cerebral ischemia type   1.TIA Pt presenting from skilled living facility with AMS. Pt reports acute onset of shaking and weakness approximately 5 hours ago. Difficult interview 2/2 AMS. CXR and head CT unremarkable. Hgb of 11.8. Troponin of 0.01. VSS. Neurology consulted and suspect subcortical posterior circulation infarct. Recommend tele observation and stroke team will assess tomorrow. Discussed with Dr. Lovell Sheehan who approves admission.     Rolm Gala Alana [place], PA-C 09/15/14 2345  Eber Hong, MD 09/15/14 2348

## 2014-09-15 NOTE — H&P (Addendum)
[facility] Admission History and Physical       Bailey Duke ZOX:[id number] DOB: January 04, 1926 DOA: 09/15/2014  Referring physician: EDP PCP: [name], MD  Specialists: Neurology  Chief Complaint: Slurred Speech, Confusion and Blurry Vision  HPI: Bailey Duke is a 81 y.o. female with a history of HTN, Hyperlipidemia, OSA, and Dementia who presents to the ED with complaints of Weakness , blurry vision and slurred speech after eating lunch.  She was brought [facility] the ED from [facility] as a Code Stroke.   She was evaluated and had a Ct scan of the Head which was negative for acute findings and while in the ED her symptoms began to improve.  She went for an MRI of the Brain and results are pending at this time.   She was seen and evaluated by Neurology while in the ED.     Review of Systems:  Constitutional: No Weight Loss, No Weight Gain, Night Sweats, Fevers, Chills, Dizziness, Light Headedness, Fatigue, or Generalized Weakness HEENT: No Headaches, Difficulty Swallowing,Tooth/Dental Problems,Sore Throat,  No Sneezing, Rhinitis, Ear Ache, Nasal Congestion, or Post Nasal Drip,  Cardio-vascular:  No Chest pain, Orthopnea, PND, Edema in Lower Extremities, Anasarca, Dizziness, Palpitations  Resp: No Dyspnea, No DOE, No Productive Cough, No Non-Productive Cough, No Hemoptysis, No Wheezing.    GI: No Heartburn, Indigestion, Abdominal Pain, Nausea, Vomiting, Diarrhea, Constipation, Hematemesis, Hematochezia, Melena, Change in Bowel Habits,  Loss of Appetite  GU: No Dysuria, No Change in Color of Urine, No Urgency or Urinary Frequency, No Flank pain.  Musculoskeletal: No Joint Pain or Swelling, No Decreased Range of Motion, No Back Pain.  Neurologic: No Syncope, No Seizures, Muscle Weakness, Paresthesia,  +Slurred Speech, +Vision Disturbance +Diplopia, No Vertigo, No Difficulty Walking,  Skin: No Rash or Lesions. Psych: No Change in Mood or Affect, No Depression or Anxiety, No  Memory loss, +Confusion, or Hallucinations    Past Medical History  Diagnosis Date  . Hypertension   . CAD (coronary artery disease)   . Systolic CHF   . Essential hypertension   . GERD (gastroesophageal reflux disease)   . Hyperlipidemia   . Spinal stenosis   . Osteoarthritis   . Sleep apnea     on CPAP qhs  . Hypothyroid   . Benign paroxysmal vertigo   . Proliferative retinopathy of left eye     Non-Diabetic  . B12 deficiency anemia   . Vitamin D deficiency   . OAB (overactive bladder)   . Hypersomnia      Past Surgical History  Procedure Laterality Date  . Joint replacement        Prior to Admission medications   Medication Sig Start Date End Date Taking? Authorizing Provider  aspirin EC 81 MG tablet Take 81 mg by mouth daily. 03/06/14  Yes Historical Provider, MD  atorvastatin (LIPITOR) 20 MG tablet Take 20 mg by mouth daily. 08/19/14 08/19/15 Yes Historical Provider, MD  fluticasone (FLONASE) 50 MCG/ACT nasal spray Place 1 spray into both nostrils daily. 03/05/14 03/05/15 Yes Historical Provider, MD  furosemide (LASIX) 20 MG tablet Take 40 mg by mouth daily. 08/02/14 08/02/15 Yes Historical Provider, MD  gabapentin (NEURONTIN) 300 MG capsule Take 1 capsule in the morning and 2 capsules in the evening. 03/18/14 03/18/16 Yes Historical Provider, MD  modafinil (PROVIGIL) 200 MG tablet Take 200 mg by mouth daily. 08/26/14  Yes Historical Provider, MD  RA KRILL OIL 500 MG CAPS Take 1 capsule by mouth daily. 04/01/14  Yes Historical  Provider, MD  spironolactone (ALDACTONE) 25 MG tablet Take 25 mg by mouth daily. 03/06/14  Yes Historical Provider, MD  traMADol (ULTRAM) 50 MG tablet Take 50 mg by mouth daily. 03/18/14 03/18/15 Yes Historical Provider, MD  acetaminophen (TYLENOL) 325 MG tablet Take 650 mg by mouth every 4 (four) hours as needed for mild pain or fever.    Historical Provider, MD  amLODipine (NORVASC) 5 MG tablet Take 5 mg by mouth every morning.    Historical Provider, MD   Armodafinil (NUVIGIL) 250 MG tablet Take 1 tablet (250 mg total) by mouth every morning. 01/27/13   Meredeth Ide, MD  aspirin 325 MG tablet Take 1 tablet (325 mg total) by mouth daily. 01/27/13   Meredeth Ide, MD  camphor-menthol [employer]) lotion Apply 1 application topically 2 (two) times daily as needed for itching.    Historical Provider, MD  carvedilol (COREG) 6.25 MG tablet Take 6.25 mg by mouth 2 (two) times daily with a meal.    Historical Provider, MD  cetirizine (ZYRTEC) 10 MG tablet Take 10 mg by mouth daily as needed for allergies or rhinitis.    Historical Provider, MD  Cholecalciferol (VITAMIN D) 2000 UNITS CAPS Take 2,000 Units by mouth every morning.    Historical Provider, MD  cyanocobalamin (,VITAMIN B-12,) 1000 MCG/ML injection Inject 1,000 mcg into the muscle every 30 (thirty) days.    Historical Provider, MD  diclofenac sodium (VOLTAREN) 1 % GEL Apply 4 g topically every 6 (six) hours as needed (for lumbosacral spondylosis).    Historical Provider, MD  HYDROcodone-acetaminophen (NORCO/VICODIN) 5-325 MG per tablet Take 1 tablet by mouth every 6 (six) hours as needed for moderate pain. 01/27/13   Meredeth Ide, MD  levothyroxine (SYNTHROID, LEVOTHROID) 100 MCG tablet Take 100 mcg by mouth daily before breakfast.    Historical Provider, MD  Liniments (PENETRAN PLUS EX) Apply 1 application topically 3 (three) times daily as needed (for joint pain).    Historical Provider, MD  losartan (COZAAR) 100 MG tablet Take 100 mg by mouth every morning.    Historical Provider, MD  magnesium oxide (MAG-OX) 400 MG tablet Take 400 mg by mouth at bedtime.    Historical Provider, MD  meclizine (ANTIVERT) 25 MG tablet Take 25 mg by mouth 3 (three) times daily as needed for dizziness.    Historical Provider, MD  Menthol, Topical Analgesic, (BIOFREEZE) 4 % GEL Apply 1 application topically 3 (three) times daily as needed (for joint pain).    Historical Provider, MD  Multiple Vitamins-Minerals (MULTIVITAMIN PO)  Take 1 tablet by mouth every morning.    Historical Provider, MD  omeprazole (PRILOSEC) 20 MG capsule Take 20 mg by mouth daily.    Historical Provider, MD  oxybutynin (DITROPAN-XL) 5 MG 24 hr tablet Take 5 mg by mouth at bedtime.    Historical Provider, MD  RANEXA 500 MG 12 hr tablet Take 500 mg by mouth 2 (two) times daily. 08/19/14   Historical Provider, MD  ranolazine (RANEXA) 1000 MG SR tablet Take 1,000 mg by mouth 2 (two) times daily. 08/02/14 08/02/15  Historical Provider, MD  Soybean Lecithin (PHOSPHATIDYLSERINE) 40 % POWD Take 500 mg by mouth 3 (three) times daily.    Historical Provider, MD  Turmeric (RA TURMERIC) 500 MG CAPS Take 500 mg by mouth daily.    Historical Provider, MD  Turmeric, Curcuma Longa, (CURCUMIN EXTRACT) POWD 1 application by Does not apply route 3 (three) times daily.    Historical Provider, MD  vitamin C (ASCORBIC ACID) 500 MG tablet Take 1,000 mg by mouth every morning.    Historical Provider, MD     Allergies  Allergen Reactions  . Tetanus Toxoids Other (See Comments)    RED LINE FROM INJECTION SITE DOWN ARM  . Penicillins Other (See Comments)    1940 FORMS - TINY ITCHY PIMPLES ON FINGERS  . Procaine Other (See Comments)    ?    Marland Kitchen Sulfa Antibiotics     Social History:  reports that she has never smoked. She does not have any smokeless tobacco history on file. She reports that she drinks about 1.2 oz of alcohol per week. She reports that she does not use illicit drugs.    Family History  Problem Relation Age of Onset  . Breast cancer Mother   . Other Father     Hardening of the arteries to the head       Physical Exam:  GEN:  Pleasant Elderly Obese  81 y.o. Caucasian female examined and in no acute distress; cooperative with exam Filed Vitals:   09/15/14 2030 09/15/14 2054 09/15/14 2100 09/15/14 2203  BP: 162/80 162/80 168/83   Pulse: 88 92 92   Temp:    98.1 F (36.7 C)  TempSrc:      Resp: Weight:      SpO2: 95% 92% 97%    Blood  pressure 168/83, pulse 92, temperature 98.1 F (36.7 C), temperature source Oral, resp. rate 16, weight 91.1 kg (200 lb 13.4 oz), SpO2 97 %. PSYCH: She is alert and oriented x4; does not appear anxious does not appear depressed; affect is normal HEENT: Normocephalic and Atraumatic, Mucous membranes pink; PERRLA; EOM intact; Fundi:  Benign;  No scleral icterus, Nares: Patent, Oropharynx: Clear,,    Neck:  FROM, No Cervical Lymphadenopathy nor Thyromegaly or Carotid Bruit; No JVD; Breasts:: Not examined CHEST WALL: No tenderness CHEST: Normal respiration, clear to auscultation bilaterally HEART: Regular rate and rhythm; no murmurs rubs or gallops BACK: No kyphosis or scoliosis; No CVA tenderness ABDOMEN: Positive Bowel Sounds, Obese, Soft Non-Tender, No Rebound or Guarding; No Masses, No Organomegaly, No Pannus; No Intertriginous candida. Rectal Exam: Not done EXTREMITIES: No Cyanosis, Clubbing, or Edema; No Ulcerations. Genitalia: not examined PULSES: 2+ and symmetric SKIN: Normal hydration no rash or ulceration  CNS:  Alert and Oriented x 4, No Focal Deficits  Mental Status:  Alert, Oriented, Thought Content Appropriate. Speech Fluent without evidence of Aphasia. Able to follow 3 step commands without difficulty.      In No obvious pain.    Cranial Nerves:  II: Discs flat bilaterally; Visual fields Intact, Pupils equal and reactive.     III,IV, VI: Extra-ocular motions intact bilaterally     V,VII: smile symmetric, facial light touch sensation normal bilaterally     VIII: hearing intact bilaterally     IX,X: gag reflex present     XI: bilateral shoulder shrug     XII: midline tongue extension    Motor:  Right:  Upper extremity 5/5     Left:  Upper extremity 5/5      Right:  Lower extremity 5/5    Left:  Lower extremity 5/5      Tone and Bulk:  normal tone throughout; no atrophy noted    Sensory:  Pinprick and light touch intact throughout, bilaterally    Deep Tendon Reflexes: 2+  and symmetric throughout    Plantars/ Babinski:  Right: normal Left:  normal     Cerebellar:  Finger to nose without difficulty.    Gait: deferred    Vascular: pulses palpable throughout    Labs on Admission:  Basic Metabolic Panel:  Recent Labs Lab 09/15/14 1904 09/15/14 1914  NA 137 136  K 4.6 4.8  CL 106 106  CO2  --  22  GLUCOSE 123* 123*  BUN 38* 33*  CREATININE 1.20* 1.32*  CALCIUM  --  10.3   Liver Function Tests:  Recent Labs Lab 09/15/14 1914  AST 30  ALT 23  ALKPHOS 66  BILITOT 0.6  PROT 6.4*  ALBUMIN 3.7   No results for input(s): LIPASE, AMYLASE in the last 168 hours. No results for input(s): AMMONIA in the last 168 hours. CBC:  Recent Labs Lab 09/15/14 1904 09/15/14 1914  WBC  --  8.7  NEUTROABS  --  6.3  HGB 12.6 11.8*  HCT 37.0 35.2*  MCV  --  92.4  PLT  --  199   Cardiac Enzymes: No results for input(s): CKTOTAL, CKMB, CKMBINDEX, TROPONINI in the last 168 hours.  BNP (last 3 results) No results for input(s): BNP in the last 8760 hours.  ProBNP (last 3 results) No results for input(s): PROBNP in the last 8760 hours.  CBG:  Recent Labs Lab 09/15/14 1914  GLUCAP 105*    Radiological Exams on Admission: Dg Chest 2 View  09/15/2014   CLINICAL DATA:  Altered mental status kid patient reports feeling bad and shaking uncontrolled knee for the last 2 days. History of CHF and hypertension.  EXAM: CHEST  2 VIEW  COMPARISON:  07/30/2014  FINDINGS: Mild enlargement of the cardiopericardial silhouette is stable. No mediastinal or hilar masses or evidence of adenopathy.  There are prominent interstitial markings bilaterally, stable. Mild linear scarring is noted in the left mid lung, also stable. No lung consolidation or edema. No pleural effusion or pneumothorax.  Skeletal structures are diffusely demineralized. There arthropathic changes of the right glenohumeral joint, stable.  IMPRESSION: 1. No acute cardiopulmonary disease. 2. Stable  cardiomegaly.   Electronically Signed   By: Amie Portland M.D.   On: 09/15/2014 21:57   Ct Head Wo Contrast  09/15/2014   CLINICAL DATA:  Generalize weakness, slurred speech since 1345 hr  EXAM: CT HEAD WITHOUT CONTRAST  TECHNIQUE: Contiguous axial images were obtained from the base of the skull through the vertex without intravenous contrast.  COMPARISON:  MRI brain 01/26/2013.  CT head 01/24/2013.  FINDINGS: Diffuse cerebral atrophy. No ventricular dilatation. Scattered low-attenuation change in the white matter consistent with small vessel ischemic change. No mass effect or midline shift. No abnormal extra-axial fluid collections. Gray-white matter junctions are distinct. Basal cisterns are not effaced. No evidence of acute intracranial hemorrhage. No depressed skull fractures. Visualized paranasal sinuses and mastoid air cells are not opacified. Vascular calcifications.  IMPRESSION: No acute intracranial abnormalities. Mild chronic atrophy and small vessel ischemic changes.  These results were called by telephone at the time of interpretation on 09/15/2014 at 7:24 pm to Dr. Hyacinth Meeker, who verbally acknowledged these results.   Electronically Signed   By: Burman Nieves M.D.   On: 09/15/2014 19:31     EKG: Independently reviewed. Normal Sinus Rhythm Rate =91         Assessment/Plan:   81 y.o. female with  Principal Problem:   1.     TIA (transient ischemic attack)- vs CVA   TIA Workup   CT head, and MRI Brain done  Cardiac Monitoring   Neuro Checks   Carotid US and 2D ECHO in AM   Check Fasting Lipids and HbA1C   Neurology seeing      Active Problems:    2.    CAD   Cardiac Monitoring    Continue Ranexa, Carvedilol, Losartan, ASA, and Atorvastatin Rx     3.    Chronic systolic CHF   Contiue Lasix, Carvedilol, Losartan Rx   Monitor I/Os       4.     Hypothyroid   Levothyroxine   Check TSH     5.     Hypertension   Continue Carvedilol, Lasix, and Losartan   Monitor BPs     6.     Hyperlipidemia   Continue Atorvastatin Rx     7.     Obstructive sleep apnea   CPAP qhs     8.     Dementia- Mild       9.     DVT Prophylaxis   Lovenox    Code Status:     FULL CODE      Family Communication:   No Family Present    Disposition Plan:   Observation Status        Time spent:  19 Minutes      Ron Parker [facility] Pager [phone]   If 7AM -7PM Please Contact the Day Rounding Team MD for [facility]  If 7PM-7AM, Please Contact Night-Floor Coverage  [email or web address] Password [email or web address] 09/15/2014, 11:27 PM     ADDENDUM:   Patient was seen and examined on 09/15/2014

## 2014-09-15 NOTE — ED Notes (Addendum)
Patient transported to xr

## 2014-09-15 NOTE — ED Notes (Signed)
Speech has improved and pt has no weakness noted to bilateral legs.

## 2014-09-15 NOTE — ED Notes (Signed)
[facility] with c/o onset of 1350 pt c/o not feeling well. Pt also c/o generalized weakness, slurred speech, double vision and blurred vision.

## 2014-09-16 ENCOUNTER — Observation Stay (HOSPITAL_BASED_OUTPATIENT_CLINIC_OR_DEPARTMENT_OTHER): Payer: Medicare Other

## 2014-09-16 ENCOUNTER — Encounter (HOSPITAL_COMMUNITY): Payer: Self-pay | Admitting: Internal Medicine

## 2014-09-16 DIAGNOSIS — G459 Transient cerebral ischemic attack, unspecified: Secondary | ICD-10-CM

## 2014-09-16 DIAGNOSIS — R251 Tremor, unspecified: Secondary | ICD-10-CM | POA: Diagnosis not present

## 2014-09-16 DIAGNOSIS — N390 Urinary tract infection, site not specified: Secondary | ICD-10-CM

## 2014-09-16 DIAGNOSIS — I10 Essential (primary) hypertension: Secondary | ICD-10-CM | POA: Diagnosis not present

## 2014-09-16 LAB — TROPONIN I
Troponin I: <0.03 ng/mL (ref <0.031)
Troponin I: <0.03 ng/mL (ref <0.031)
Troponin I: <0.03 ng/mL (ref <0.031)
Troponin I: <0.03 ng/mL (ref <0.031)

## 2014-09-16 LAB — URINE MICROSCOPIC-ADD ON

## 2014-09-16 LAB — URINALYSIS, ROUTINE W REFLEX MICROSCOPIC
Bilirubin Urine: NEGATIVE
GLUCOSE, UA: NEGATIVE mg/dL
Hgb urine dipstick: NEGATIVE
KETONES UR: NEGATIVE mg/dL
NITRITE: NEGATIVE
PH: 6 (ref 5.0–8.0)
Protein, ur: NEGATIVE mg/dL
Specific Gravity, Urine: 1.019 (ref 1.005–1.030)
Urobilinogen, UA: 0.2 mg/dL (ref 0.0–1.0)

## 2014-09-16 LAB — LIPID PANEL
CHOL/HDL RATIO: 3.3 ratio
CHOLESTEROL: 205 mg/dL — AB (ref 0–200)
HDL: 62 mg/dL (ref >40)
LDL Cholesterol: 131 mg/dL — ABNORMAL HIGH (ref 0–99)
TRIGLYCERIDES: 59 mg/dL (ref <150)
VLDL: 12 mg/dL (ref 0–40)

## 2014-09-16 LAB — VITAMIN B12: VITAMIN B 12: 567 pg/mL (ref 180–914)

## 2014-09-16 MED ORDER — OXYBUTYNIN CHLORIDE ER 5 MG PO TB24
5.0000 mg | ORAL_TABLET | Freq: Every day | ORAL | Status: DC
  Filled 2014-09-16: qty 1

## 2014-09-16 MED ORDER — ATORVASTATIN CALCIUM 10 MG PO TABS
20.0000 mg | ORAL_TABLET | Freq: Every day | ORAL | Status: DC
  Administered 2014-09-16 – 2014-09-17 (×2): 20 mg via ORAL
  Filled 2014-09-16: qty 1
  Filled 2014-09-16 (×2): qty 2

## 2014-09-16 MED ORDER — DEXTROSE 5 % IV SOLN
1.0000 g | INTRAVENOUS | Status: DC
  Administered 2014-09-16 – 2014-09-17 (×2): 1 g via INTRAVENOUS
  Filled 2014-09-16 (×3): qty 10

## 2014-09-16 MED ORDER — DIPHENHYDRAMINE HCL 25 MG PO CAPS
25.0000 mg | ORAL_CAPSULE | Freq: Once | ORAL | Status: AC
  Administered 2014-09-16: 25 mg via ORAL
  Filled 2014-09-16: qty 1

## 2014-09-16 MED ORDER — ACETAMINOPHEN 325 MG PO TABS: 650.0000 mg | ORAL_TABLET | Freq: Once | ORAL | Status: DC

## 2014-09-16 MED ORDER — GABAPENTIN 300 MG PO CAPS
300.0000 mg | ORAL_CAPSULE | Freq: Every morning | ORAL | Status: DC
  Administered 2014-09-17: 300 mg via ORAL
  Filled 2014-09-16: qty 1

## 2014-09-16 MED ORDER — MODAFINIL 100 MG PO TABS
200.0000 mg | ORAL_TABLET | Freq: Every day | ORAL | Status: DC
  Administered 2014-09-16 – 2014-09-17 (×2): 200 mg via ORAL
  Filled 2014-09-16: qty 2
  Filled 2014-09-16: qty 1

## 2014-09-16 MED ORDER — ENOXAPARIN SODIUM 30 MG/0.3ML ~~LOC~~ SOLN
30.0000 mg | Freq: Every day | SUBCUTANEOUS | Status: DC
  Administered 2014-09-16 – 2014-09-17 (×2): 30 mg via SUBCUTANEOUS
  Filled 2014-09-16 (×2): qty 0.3

## 2014-09-16 MED ORDER — VITAMIN C 500 MG PO TABS
1000.0000 mg | ORAL_TABLET | Freq: Every morning | ORAL | Status: DC
  Administered 2014-09-16 – 2014-09-17 (×2): 1000 mg via ORAL
  Filled 2014-09-16 (×2): qty 2

## 2014-09-16 MED ORDER — FUROSEMIDE 20 MG PO TABS
40.0000 mg | ORAL_TABLET | Freq: Every day | ORAL | Status: DC
  Filled 2014-09-16: qty 2

## 2014-09-16 MED ORDER — LOSARTAN POTASSIUM 50 MG PO TABS
100.0000 mg | ORAL_TABLET | Freq: Every morning | ORAL | Status: DC
  Administered 2014-09-16 – 2014-09-17 (×2): 100 mg via ORAL
  Filled 2014-09-16 (×2): qty 2

## 2014-09-16 MED ORDER — CARVEDILOL 6.25 MG PO TABS
6.2500 mg | ORAL_TABLET | Freq: Two times a day (BID) | ORAL | Status: DC
  Administered 2014-09-16 – 2014-09-17 (×3): 6.25 mg via ORAL
  Filled 2014-09-16 (×5): qty 1

## 2014-09-16 MED ORDER — VITAMIN D 1000 UNITS PO TABS
2000.0000 [IU] | ORAL_TABLET | Freq: Every morning | ORAL | Status: DC
  Administered 2014-09-16 – 2014-09-17 (×2): 2000 [IU] via ORAL
  Filled 2014-09-16 (×2): qty 2

## 2014-09-16 MED ORDER — MAGNESIUM OXIDE 400 (241.3 MG) MG PO TABS
400.0000 mg | ORAL_TABLET | Freq: Every day | ORAL | Status: DC
  Administered 2014-09-16: 400 mg via ORAL
  Filled 2014-09-16 (×2): qty 1

## 2014-09-16 MED ORDER — LORATADINE 10 MG PO TABS
10.0000 mg | ORAL_TABLET | Freq: Every day | ORAL | Status: DC
  Administered 2014-09-16 – 2014-09-17 (×2): 10 mg via ORAL
  Filled 2014-09-16 (×2): qty 1

## 2014-09-16 MED ORDER — ONDANSETRON HCL 4 MG/2ML IJ SOLN
4.0000 mg | Freq: Three times a day (TID) | INTRAMUSCULAR | Status: AC | PRN
  Administered 2014-09-16: 4 mg via INTRAVENOUS
  Filled 2014-09-16: qty 2

## 2014-09-16 MED ORDER — LEVOTHYROXINE SODIUM 100 MCG PO TABS
100.0000 ug | ORAL_TABLET | Freq: Every day | ORAL | Status: DC
  Administered 2014-09-16 – 2014-09-17 (×2): 100 ug via ORAL
  Filled 2014-09-16 (×3): qty 1

## 2014-09-16 MED ORDER — GABAPENTIN 300 MG PO CAPS
300.0000 mg | ORAL_CAPSULE | Freq: Two times a day (BID) | ORAL | Status: DC
  Administered 2014-09-16: 300 mg via ORAL
  Filled 2014-09-16 (×2): qty 1

## 2014-09-16 MED ORDER — ASPIRIN 300 MG RE SUPP: 300.0000 mg | Freq: Every day | RECTAL | Status: DC

## 2014-09-16 MED ORDER — ACETAMINOPHEN 325 MG PO TABS
650.0000 mg | ORAL_TABLET | Freq: Three times a day (TID) | ORAL | Status: DC | PRN
  Filled 2014-09-16: qty 2

## 2014-09-16 MED ORDER — SODIUM CHLORIDE 0.9 % IV SOLN
INTRAVENOUS | Status: AC
  Administered 2014-09-16: 12:00:00 via INTRAVENOUS

## 2014-09-16 MED ORDER — SENNOSIDES-DOCUSATE SODIUM 8.6-50 MG PO TABS
1.0000 | ORAL_TABLET | Freq: Every evening | ORAL | Status: DC | PRN
  Administered 2014-09-16: 1 via ORAL
  Filled 2014-09-16 (×2): qty 1

## 2014-09-16 MED ORDER — RANOLAZINE ER 500 MG PO TB12
1000.0000 mg | ORAL_TABLET | Freq: Two times a day (BID) | ORAL | Status: DC
  Administered 2014-09-16 – 2014-09-17 (×3): 1000 mg via ORAL
  Filled 2014-09-16 (×4): qty 2

## 2014-09-16 MED ORDER — ASPIRIN 325 MG PO TABS
325.0000 mg | ORAL_TABLET | Freq: Every day | ORAL | Status: DC
  Administered 2014-09-16: 325 mg via ORAL
  Filled 2014-09-16: qty 1

## 2014-09-16 MED ORDER — STROKE: EARLY STAGES OF RECOVERY BOOK
Freq: Once | Status: AC
  Administered 2014-09-16: 18:00:00
  Filled 2014-09-16: qty 1

## 2014-09-16 MED ORDER — GABAPENTIN 300 MG PO CAPS
600.0000 mg | ORAL_CAPSULE | Freq: Every day | ORAL | Status: DC
  Administered 2014-09-16: 600 mg via ORAL

## 2014-09-16 MED ORDER — PANTOPRAZOLE SODIUM 40 MG PO TBEC
40.0000 mg | DELAYED_RELEASE_TABLET | Freq: Every day | ORAL | Status: DC
  Administered 2014-09-16 – 2014-09-17 (×2): 40 mg via ORAL
  Filled 2014-09-16 (×2): qty 1

## 2014-09-16 MED ORDER — MECLIZINE HCL 12.5 MG PO TABS
25.0000 mg | ORAL_TABLET | Freq: Three times a day (TID) | ORAL | Status: DC | PRN
  Filled 2014-09-16: qty 1

## 2014-09-16 MED ORDER — SPIRONOLACTONE 25 MG PO TABS
25.0000 mg | ORAL_TABLET | Freq: Every day | ORAL | Status: DC
  Filled 2014-09-16: qty 1

## 2014-09-16 NOTE — ED Notes (Signed)
Report attempted 

## 2014-09-16 NOTE — Progress Notes (Signed)
STROKE TEAM PROGRESS NOTE   HISTORY Bailey Duke is an 81 y.o. female with a past medical history that is relevant for HTN, OSA, depression, anxiety, dementia, brought in by EMS for evaluation of acute onset dysarthria, double vision, shaking, generalikzed weakness. Patient lives at an assisted facility and said that today after eating lunch her body became weak and shaky, her speech changed and she couldn't walk to the elevator. EMS was summoned and found her altered with complains of double vision and weak all over. Denies associated HA, vertigo, difficulty swallowing, or visual changes. NIHSS 4. CT brain showed no acute abnormality. Available serologies reviewed and are not particularly revealing.she was last known well  09/15/14 at 1:45 pm. MRS: 0. Patient was not administered TPA secondary to late presentation. She was admitted for further evaluation and treatment.   SUBJECTIVE (INTERVAL HISTORY) No family is at the bedside.  Overall she feels her condition is stable. She reports violent shaking at onset of symptoms.    OBJECTIVE Temp:  [98.1 F (36.7 C)] 98.1 F (36.7 C) (08/21 2203) Pulse Rate:  [67-92] 73 (08/22 1030) Cardiac Rhythm:  [-]  Resp:  [12-25] 16 (08/22 1030) BP: (123-169)/(56-84) 123/81 mmHg (08/22 1030) SpO2:  [90 %-100 %] 95 % (08/22 1030) Weight:  [91.1 kg (200 lb 13.4 oz)] 91.1 kg (200 lb 13.4 oz) (08/21 1911)   Recent Labs Lab 09/15/14 1914  GLUCAP 105*    Recent Labs Lab 09/15/14 1904 09/15/14 1914  NA 137 136  K 4.6 4.8  CL 106 106  CO2  --  22  GLUCOSE 123* 123*  BUN 38* 33*  CREATININE 1.20* 1.32*  CALCIUM  --  10.3    Recent Labs Lab 09/15/14 1914  AST 30  ALT 23  ALKPHOS 66  BILITOT 0.6  PROT 6.4*  ALBUMIN 3.7    Recent Labs Lab 09/15/14 1904 09/15/14 1914  WBC  --  8.7  NEUTROABS  --  6.3  HGB 12.6 11.8*  HCT 37.0 35.2*  MCV  --  92.4  PLT  --  199    Recent Labs Lab 09/16/14 0320 09/16/14 0920  TROPONINI <0.03 <0.03     Recent Labs  09/15/14 1914  LABPROT 14.2  INR 1.08    Recent Labs  09/15/14 2345  COLORURINE YELLOW  LABSPEC 1.019  PHURINE 6.0  GLUCOSEU NEGATIVE  HGBUR NEGATIVE  BILIRUBINUR NEGATIVE  KETONESUR NEGATIVE  PROTEINUR NEGATIVE  UROBILINOGEN 0.2  NITRITE NEGATIVE  LEUKOCYTESUR MODERATE*       Component Value Date/Time   CHOL 205* 09/16/2014 0020   TRIG 59 09/16/2014 0020   HDL 62 09/16/2014 0020   CHOLHDL 3.3 09/16/2014 0020   VLDL 12 09/16/2014 0020   LDLCALC 131* 09/16/2014 0020   Lab Results  Component Value Date   HGBA1C 5.8* 01/25/2013      Component Value Date/Time   LABOPIA NONE DETECTED 01/25/2013 1033   COCAINSCRNUR NONE DETECTED 01/25/2013 1033   LABBENZ NONE DETECTED 01/25/2013 1033   AMPHETMU NONE DETECTED 01/25/2013 1033   THCU NONE DETECTED 01/25/2013 1033   LABBARB NONE DETECTED 01/25/2013 1033    No results for input(s): ETH in the last 168 hours.   IMAGING  Dg Chest 2 View 09/15/2014   1. No acute cardiopulmonary disease. 2. Stable cardiomegaly.      Ct Head Wo Contrast 09/15/2014    No acute intracranial abnormalities. Mild chronic atrophy and small vessel ischemic changes.    MRI HEAD  09/16/2014  1. No acute intracranial infarct or other abnormality identified. 2. Generalized age-related cerebral atrophy with moderate chronic microvascular ischemic disease. Small remote lacunar infarcts within the bilateral basal ganglia and thalami suspected.    MRA HEAD  09/16/2014    1. No large or proximal arterial branch occlusion identified within the intracranial circulation. No hemodynamically significant or correctable stenosis. 2. Mild atheromatous irregularity within the vertebrobasilar system as well as the distal MCA and PCA branches bilaterally.        PHYSICAL EXAM Pleasant elderly Caucasian lady currently not in distress. . Afebrile. Head is nontraumatic. Neck is supple without bruit.    Cardiac exam no murmur or gallop. Lungs are  clear to auscultation. Distal pulses are well felt. Neurological Exam :  Awake alert oriented 2. Diminished attention, registration and recall. Speech is fluent without aphasia or dysarthria. Follows commands well. Extraocular moments are full range without nystagmus. Blinks to threat bilaterally. Fundi were not visualized. Vision acuity seems adequate at bedside. Face is symmetric without weakness. Tongue is midline. Motor system exam reveals symmetric upper and lower extremity strength without focal weakness. Deep tendon reflexes are symmetric. Plantars are downgoing. Touch sensation is preserved bilaterally. Gait was not tested. ASSESSMENT/PLAN Ms. Bailey Duke is a 81 y.o. female with history of HTN, OSA, depression, anxiety, dementia presenting with acute onset dysarthria, double vision, shaking, generalikzed weakness. She did not receive IV t-PA due to late presentation.   Possible TIA, Concern for limb shaking TIA from carotid stenosis vs affects of spinal stenosis, workup underway  MRI  No acute stroke  MRA  No large vessel stenosis  Carotid Doppler  pending   2D Echo  pending   LDL 131  HgbA1c pending  Lovenox 30 mg sq daily for VTE prophylaxis Diet Heart Room service appropriate?: Yes; Fluid consistency:: Thin  aspirin 81 mg orally every day prior to admission, now on aspirin 81 mg orally every day and aspirin 325 mg orally every day.   Ongoing aggressive stroke risk factor management  Therapy recommendations:  pending   Disposition:  pending (resides in [place] [facility])  Essential Hypertension  Stable  Hyperlipidemia  Home meds:  lipitor 20 mg, resumed in hospital  LDL 131, goal < 70  Continue statin at discharge  Other Stroke Risk Factors  Advanced age  Obesity, Body mass index is 35.59 kg/(m^2).   Obstructive sleep apnea, on CPAP at home  Other Active Problems  Baseline dementia  UTI on rocephin  AKI  Severe spinal stenosis L4-L5, seeing NS at [facility], discussing OR  Hospital day #   Rhoderick Moody [facility] See Amion for Pager information 09/16/2014 11:30 AM  I have personally examined this patient, reviewed notes, independently viewed imaging studies, participated in medical decision making and plan of care. I have made any additions or clarifications directly to the above note. Agree with note above. She presented with sudden shaking of all 4 extremities more in the legs only upon standing with some speech difficulties. Limb shaking TIAs is a consideration but the clinical picture is not highly suggestive of stroke. She has a history of significant lumbar spinal stenosis which may be contributing Given her risk factors will evaluate for stroke. She remains at risk for recurrent episodes, neurological worsening and needs ongoing evaluation.  Delia Heady, MD Medical Director [facility] Pager: [phone] 09/16/2014 2:32 PM    To contact Stroke Continuity provider, please refer to [email or web address]. After hours, contact General Neurology

## 2014-09-16 NOTE — ED Notes (Signed)
Pt complaining of chest pain, dr Lovell Sheehan aware, ekg done and trop ordered.

## 2014-09-16 NOTE — Evaluation (Signed)
Occupational Therapy Evaluation and Discharge Patient Details Name: Bailey Duke MRN: [id number] DOB: 05-14-1925 Today's Date: 09/16/2014    History of Present Illness 81 yo female with onset of generalized weakness and  vision changes was dx'd with TIA and cardiomegaly.  Hx:  Stroke, HTN, vertigo, dementia   Clinical Impression    This 81 yo female admitted with above presents to acute OT at a [facility] with 4 wheeled RW in this environment. Since she lives by herself in an independent apartment at [facility] I am recommending HHOT to make sure she is at same level at home. Acute OT will sign off.  Follow Up Recommendations  Home health OT    Equipment Recommendations  None recommended by OT       Precautions / Restrictions Precautions Precautions: Fall Restrictions Weight Bearing Restrictions: No      Mobility Bed Mobility Overal bed mobility: Modified Independent       Supine to sit: Modified independent (Device/Increase time);HOB elevated (20 degrees, bed rail, getting out on left)        Transfers Overall transfer level: Needs assistance Equipment used: 4-wheeled walker Transfers: Sit to/from Stand Sit to Stand: Modified independent (Device/Increase time)              Balance Overall balance assessment: Needs assistance Sitting-balance support: Feet supported;No upper extremity supported Sitting balance-Leahy Scale: Good     Standing balance support: Bilateral upper extremity supported;During functional activity Standing balance-Leahy Scale: Fair (to good)                              ADL Overall ADL's : Modified independent                                             Vision Additional Comments: No change from baseline          Pertinent Vitals/Pain Pain Assessment: No/denies pain     Hand Dominance Right   Extremity/Trunk Assessment Upper Extremity Assessment Upper Extremity Assessment: RUE  deficits/detail RUE Deficits / Details: reports "frozen shoulder" but in supine she can raise it 3/4 of range for shoulder flexion           Communication Communication Communication: No difficulties   Cognition Arousal/Alertness: Awake/alert Behavior During Therapy: WFL for tasks assessed/performed Overall Cognitive Status: History of cognitive impairments - at baseline                                Home Living Family/patient expects to be discharged to::  (pt stated why to people keep saying I am ALF I am independent living at [employer])                             Home Equipment: Dan Humphreys - 4 wheels;Shower seat;Bedside commode;Hospital bed (hospital bed has upper rails)          Prior Functioning/Environment Level of Independence: Independent with assistive device(s) (4 wheeled RW at ILF (not ALF))             OT Diagnosis: Generalized weakness   OT Problem List: Decreased strength;Obesity      OT Goals(Current goals can be found in the care plan section) Acute Rehab OT  Goals Patient Stated Goal: to go home  OT Frequency:                End of Session Equipment Utilized During Treatment: Rolling walker  Activity Tolerance: Patient tolerated treatment well Patient left: in chair;with chair alarm set;with call bell/phone within reach   Time: 7829-5621 OT Time Calculation (min): 32 min Charges:  OT General Charges $OT Visit: 1 Procedure OT Evaluation $Initial OT Evaluation Tier I: 1 Procedure OT Treatments $Self Care/Home Management : 8-22 mins G-Codes: OT G-codes **NOT FOR INPATIENT CLASS** Functional Assessment Tool Used: Clinical observation Functional Limitation: Self care Self Care Current Status (H0865): At least 1 percent but less than 20 percent impaired, limited or restricted Self Care Goal Status (H8469): At least 1 percent but less than 20 percent impaired, limited or restricted Self Care Discharge Status [phone]):  At least 1 percent but less than 20 percent impaired, limited or restricted  Evette Georges [phone] 09/16/2014, 3:38 PM

## 2014-09-16 NOTE — Progress Notes (Signed)
Placed patient on CPAP and she is tolerating it well. RT will continue to monitor.

## 2014-09-16 NOTE — ED Notes (Signed)
Dr Buriev at bedside. 

## 2014-09-16 NOTE — ED Notes (Signed)
PT at bedside.

## 2014-09-16 NOTE — ED Notes (Signed)
US at bedside

## 2014-09-16 NOTE — Progress Notes (Signed)
ANTIBIOTIC CONSULT NOTE - INITIAL  Pharmacy Consult for rocephin Indication: UTI  Allergies  Allergen Reactions  . Tetanus Toxoids Other (See Comments)    RED LINE FROM INJECTION SITE DOWN ARM  . Penicillins Other (See Comments)    1940 FORMS - TINY ITCHY PIMPLES ON FINGERS  . Procaine Other (See Comments)    ?    Marland Kitchen Sulfa Antibiotics     Patient Measurements: Weight: 200 lb 13.4 oz (91.1 kg)   Vital Signs: BP: 136/67 mmHg (08/22 0900) Pulse Rate: 69 (08/22 0900) Intake/Output from previous day: 08/21 0701 - 08/22 0700 In: -  Out: 250 [Urine:250] Intake/Output from this shift:    Labs:  Recent Labs  09/15/14 1904 09/15/14 1914  WBC  --  8.7  HGB 12.6 11.8*  PLT  --  199  CREATININE 1.20* 1.32*   CrCl cannot be calculated (Unknown ideal weight.). No results for input(s): VANCOTROUGH, VANCOPEAK, VANCORANDOM, GENTTROUGH, GENTPEAK, GENTRANDOM, TOBRATROUGH, TOBRAPEAK, TOBRARND, AMIKACINPEAK, AMIKACINTROU, AMIKACIN in the last 72 hours.   Microbiology: No results found for this or any previous visit (from the past 720 hour(s)).  Medical History: Past Medical History  Diagnosis Date  . Hypertension   . CAD (coronary artery disease)   . Systolic CHF   . Essential hypertension   . GERD (gastroesophageal reflux disease)   . Hyperlipidemia   . Spinal stenosis   . Osteoarthritis   . Sleep apnea     on CPAP qhs  . Hypothyroid   . Benign paroxysmal vertigo   . Proliferative retinopathy of left eye     Non-Diabetic  . B12 deficiency anemia   . Vitamin D deficiency   . OAB (overactive bladder)   . Hypersomnia    Assessment: 81 yo F admitted with slurred speech, confusion and blurry vision.  Pharmacy consulted to dose Rocephin for UTI.    WBC 8.7, AF, wt 91.1 kg.  PMH of PCN allergy with reaction: in 1940 had tiny itch pimples on fingers.  Should be OK with Rocephin - only a very small chance of cross-reactivity in pts with anaphylaxis to PCN.   Goal of Therapy:   Treat UTI  Plan:  -rocephin 1 gm q24 Pharmacy will sign off. Please re-consult if needed Thanks Herby Abraham, Pharm.D. [phone] 09/16/2014 10:16 AM

## 2014-09-16 NOTE — Progress Notes (Signed)
Attempted to get report. 

## 2014-09-16 NOTE — Evaluation (Signed)
Physical Therapy Evaluation Patient Details Name: Bailey Duke MRN: [id number] DOB: 19-Apr-1925 Today's Date: 09/16/2014   History of Present Illness  81 yo female with onset of generalized weakness and  vision changes was dx'd with TIA and cardiomegaly.  Hx:  Stroke, HTN, vertigo, dementia  Clinical Impression  Pt was able to walk only a short trip due to her feeling of weakness and was in need of help with all mobility.  Her plan will include therapy in house and follow up with HHPT, mainly to mitigate her fall risk and her functional level that needs to be recovered for full safe return to ALF.    Follow Up Recommendations Home health PT;Supervision/Assistance - 24 hour    Equipment Recommendations  None recommended by PT    Recommendations for Other Services       Precautions / Restrictions Precautions Precautions: Fall (telemetry) Restrictions Weight Bearing Restrictions: No      Mobility  Bed Mobility Overal bed mobility: Needs Assistance Bed Mobility: Supine to Sit;Sit to Supine     Supine to sit: Mod assist Sit to supine: Min assist   General bed mobility comments: pt has trouble sequencing and problem solvng  Transfers Overall transfer level: Needs assistance Equipment used: Rolling walker (2 wheeled);1 person hand held assist Transfers: Sit to/from Stand Sit to Stand: Min assist         General transfer comment: reminders for hand placement and  safety, sequencing  Ambulation/Gait Ambulation/Gait assistance: Min assist Ambulation Distance (Feet): 10 Feet Assistive device: Rolling walker (2 wheeled);1 person hand held assist Gait Pattern/deviations: Step-through pattern;Step-to pattern;Decreased stride length;Wide base of support Gait velocity: reduced Gait velocity interpretation: Below normal speed for age/gender General Gait Details: has wide based steps with some halting progression and 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    Modified Rankin (Stroke Patients Only) Modified Rankin (Stroke Patients Only) Pre-Morbid Rankin Score: Slight disability Modified Rankin: Moderately severe disability     Balance Overall balance assessment: Needs assistance Sitting-balance support: Feet supported Sitting balance-Leahy Scale: Fair   Postural control: Posterior lean Standing balance support: Bilateral upper extremity supported Standing balance-Leahy Scale: Poor                               Pertinent Vitals/Pain Pain Assessment: No/denies pain    Home Living Family/patient expects to be discharged to:: Assisted living               Home Equipment: Walker - 2 wheels;Bedside commode;Shower seat;Other (comment) (Pt is somewhat confused about this)      Prior Function Level of Independence: Needs assistance   Gait / Transfers Assistance Needed: I with RW in ALF per pt  ADL's / Homemaking Assistance Needed: ALF handles cleaning and cooking        Hand Dominance        Extremity/Trunk Assessment   Upper Extremity Assessment: Generalized weakness           Lower Extremity Assessment: Generalized weakness      Cervical / Trunk Assessment: Normal  Communication   Communication: No difficulties;Other (comment) (confused in giving information)  Cognition Arousal/Alertness: Lethargic Behavior During Therapy: Anxious Overall Cognitive Status: History of cognitive impairments - at baseline       Memory: Decreased recall of precautions;Decreased short-term memory  General Comments General comments (skin integrity, edema, etc.): Pt has reduced functional level from her previous ability to walk in ALF.  Will anticipate she is going to get HHPT to follow up and will have staff assistance from ALF    Exercises        Assessment/Plan    PT Assessment Patient needs continued PT services  PT Diagnosis Generalized weakness;Altered  mental status   PT Problem List Decreased strength;Decreased range of motion;Decreased activity tolerance;Decreased balance;Decreased mobility;Decreased coordination;Decreased cognition;Decreased knowledge of use of DME;Decreased safety awareness;Decreased knowledge of precautions;Cardiopulmonary status limiting activity;Obesity  PT Treatment Interventions DME instruction;Gait training;Functional mobility training;Therapeutic exercise;Therapeutic activities;Balance training;Neuromuscular re-education;Patient/family education   PT Goals (Current goals can be found in the Care Plan section) Acute Rehab PT Goals Patient Stated Goal: to be able to walk at home PT Goal Formulation: With patient Time For Goal Achievement: 09/30/14 Potential to Achieve Goals: Good    Frequency Min 2X/week   Barriers to discharge Other (comment) (lower functional level and needs gait assistance now) staff will have to assist gait    Co-evaluation               End of Session Equipment Utilized During Treatment: Gait belt Activity Tolerance: Patient limited by fatigue;Patient limited by lethargy Patient left: in bed;with call bell/phone within reach;with nursing/sitter in room Nurse Communication: Mobility status;Precautions;Other (comment) (discussed her cognition)    Functional Assessment Tool Used: clinical judgment Functional Limitation: Mobility: Walking and moving around Mobility: Walking and Moving Around Current Status [phone]): At least 20 percent but less than 40 percent impaired, limited or restricted Mobility: Walking and Moving Around Goal Status [phone]): At least 20 percent but less than 40 percent impaired, limited or restricted    Time: 1015-1047 PT Time Calculation (min) (ACUTE ONLY): 32 min   Charges:   PT Evaluation $Initial PT Evaluation Tier I: 1 Procedure PT Treatments $Gait Training: 8-22 mins   PT G Codes:   PT G-Codes **NOT FOR INPATIENT CLASS** Functional Assessment Tool  Used: clinical judgment Functional Limitation: Mobility: Walking and moving around Mobility: Walking and Moving Around Current Status (U9811): At least 20 percent but less than 40 percent impaired, limited or restricted Mobility: Walking and Moving Around Goal Status [phone]): At least 20 percent but less than 40 percent impaired, limited or restricted    Ivar Drape 09/16/2014, 11:52 AM   Samul Dada, PT MS Acute Rehab Dept. Number: [facility] [id number] and [facility] [phone]

## 2014-09-16 NOTE — Progress Notes (Signed)
Pt admitted to [facility] for TIA workup and UTI.  Pt alert and oriented at present.  CT and MRI negative for any acute changes.  Pt denies any discomfort other than chronic neck and shoulder pain.  Placed on telemetry and CCMD called. Bed alarm set and she verbalizes understanding of calling for help before attempting to get out of bed. Will continue to monitor.

## 2014-09-16 NOTE — Progress Notes (Signed)
*  PRELIMINARY RESULTS* Echocardiogram 2D Echocardiogram has been performed.  Bailey Duke 09/16/2014, 9:42 AM

## 2014-09-16 NOTE — Progress Notes (Signed)
*  PRELIMINARY RESULTS* Vascular Ultrasound Carotid Duplex (Doppler) has been completed.  Preliminary findings: Bilateral:  1-39% ICA stenosis.  Vertebral artery flow is antegrade.      Farrel Demark, RDMS, RVT  09/16/2014, 2:09 PM

## 2014-09-16 NOTE — Progress Notes (Signed)
[facility] PROGRESS NOTE  Bailey Duke WUJ:[id number] DOB: 23-Aug-1925 DOA: 09/15/2014 PCP: [name], MD  Assessment/Plan: 81 y/o female with PMH of HTN, HPL, OSA, Mild Dementia is presented with slurred speech, confusion. Patient also reports episode of uncontrolled shakiness, dysuria  -admitted for work up for TIA. Currently, neuro exam is non focal, CT/MRI head: no acute findings. We will cont monitor, pend TIA work up. UA is positive for UTI    1. UTI, started ceftriaxone IV, obtain cultures  2. Confusion, blurry vision. Likely due to UTI. Neuro exam is non focal. CT/MRI head: no acute findings. Symptoms-->resolved. TIA work up is in progress. Start treatment of UTI 3. AKI. Start gentle IVF for 10 hours, hold lasix. Patient is dehydrated. Hold diuretics for today. Recheck renal function AM 4. HTN, cont BB. Hold diuretics today. Prn hydralazine.     Code Status: full Family Communication: d/w patient  (indicate person spoken with, relationship, and if by phone, the number) Disposition Plan: Alf 24-48 hrs    Consultants:  Neurology   Procedures:  Pend echo   Antibiotics:  None  (indicate start date, and stop date if known)  HPI/Subjective: Alert, oriented   Objective: Filed Vitals:   09/16/14 0900  BP: 136/67  Pulse: 69  Temp:   Resp: 25    Intake/Output Summary (Last 24 hours) at 09/16/14 0953 Last data filed at 09/15/14 2113  Gross per 24 hour  Intake      0 ml  Output    250 ml  Net   -250 ml   Filed Weights   09/15/14 1911  Weight: 91.1 kg (200 lb 13.4 oz)    Exam:   General:  No distress   Cardiovascular: s1,s2 rrr  Respiratory: CTA BL  Abdomen: soft, nt, nd   Musculoskeletal: no leg pitting edema  Neuro: AAAx3. Motor 5/5 BL   Data Reviewed: Basic Metabolic Panel:  Recent Labs Lab 09/15/14 1904 09/15/14 1914  NA 137 136  K 4.6 4.8  CL 106 106  CO2  --  22  GLUCOSE 123* 123*  BUN 38* 33*  CREATININE 1.20* 1.32*   CALCIUM  --  10.3   Liver Function Tests:  Recent Labs Lab 09/15/14 1914  AST 30  ALT 23  ALKPHOS 66  BILITOT 0.6  PROT 6.4*  ALBUMIN 3.7   No results for input(s): LIPASE, AMYLASE in the last 168 hours. No results for input(s): AMMONIA in the last 168 hours. CBC:  Recent Labs Lab 09/15/14 1904 09/15/14 1914  WBC  --  8.7  NEUTROABS  --  6.3  HGB 12.6 11.8*  HCT 37.0 35.2*  MCV  --  92.4  PLT  --  199   Cardiac Enzymes:  Recent Labs Lab 09/16/14 0320  TROPONINI <0.03   BNP (last 3 results) No results for input(s): BNP in the last 8760 hours.  ProBNP (last 3 results) No results for input(s): PROBNP in the last 8760 hours.  CBG:  Recent Labs Lab 09/15/14 1914  GLUCAP 105*    No results found for this or any previous visit (from the past 240 hour(s)).   Studies: Dg Chest 2 View  09/15/2014   CLINICAL DATA:  Altered mental status kid patient reports feeling bad and shaking uncontrolled knee for the last 2 days. History of CHF and hypertension.  EXAM: CHEST  2 VIEW  COMPARISON:  07/30/2014  FINDINGS: Mild enlargement of the cardiopericardial silhouette is stable. No mediastinal or hilar masses or evidence of  adenopathy.  There are prominent interstitial markings bilaterally, stable. Mild linear scarring is noted in the left mid lung, also stable. No lung consolidation or edema. No pleural effusion or pneumothorax.  Skeletal structures are diffusely demineralized. There arthropathic changes of the right glenohumeral joint, stable.  IMPRESSION: 1. No acute cardiopulmonary disease. 2. Stable cardiomegaly.   Electronically Signed   By: Amie Portland M.D.   On: 09/15/2014 21:57   Ct Head Wo Contrast  09/15/2014   CLINICAL DATA:  Generalize weakness, slurred speech since 1345 hr  EXAM: CT HEAD WITHOUT CONTRAST  TECHNIQUE: Contiguous axial images were obtained from the base of the skull through the vertex without intravenous contrast.  COMPARISON:  MRI brain 01/26/2013.   CT head 01/24/2013.  FINDINGS: Diffuse cerebral atrophy. No ventricular dilatation. Scattered low-attenuation change in the white matter consistent with small vessel ischemic change. No mass effect or midline shift. No abnormal extra-axial fluid collections. Gray-white matter junctions are distinct. Basal cisterns are not effaced. No evidence of acute intracranial hemorrhage. No depressed skull fractures. Visualized paranasal sinuses and mastoid air cells are not opacified. Vascular calcifications.  IMPRESSION: No acute intracranial abnormalities. Mild chronic atrophy and small vessel ischemic changes.  These results were called by telephone at the time of interpretation on 09/15/2014 at 7:24 pm to Dr. Hyacinth Meeker, who verbally acknowledged these results.   Electronically Signed   By: Burman Nieves M.D.   On: 09/15/2014 19:31   Mr [name] Head Wo Contrast  09/16/2014   CLINICAL DATA:  Initial evaluation for acute dysarthria, double vision, generalized weakness.  EXAM: MRI HEAD WITHOUT CONTRAST  MRA HEAD WITHOUT CONTRAST  TECHNIQUE: Multiplanar, multiecho pulse sequences of the brain and surrounding structures were obtained without intravenous contrast. Angiographic images of the head were obtained using MRA technique without contrast.  COMPARISON:  Prior CT from earlier the same day.  FINDINGS: MRI HEAD FINDINGS  Diffuse prominence of the CSF containing spaces is compatible with generalized age-related cerebral atrophy. Patchy and confluent T2/FLAIR hyperintensity within the periventricular and deep white matter both cerebral hemispheres most consistent with chronic small vessel ischemic disease. Probable remote lacunar infarcts within the bilateral basal ganglia and thalami.  No abnormal foci of restricted diffusion to suggest acute intracranial infarct. Gray-white matter differentiation maintained. Normal intravascular flow voids are preserved. No acute or chronic intracranial hemorrhage.  No mass lesion or midline  shift. No mass effect. No hydrocephalus. No extra-axial fluid collection para  Craniocervical junction within normal limits. Degenerative spondylolysis noted within the upper cervical spine without significant stenosis. Pituitary gland within normal limits.  No acute abnormality about the orbits. Sequelae of prior bilateral cataract extraction noted.  Mild mucosal thickening within the ethmoidal air cells. Cells and maxillary sinuses. Paranasal sinuses are otherwise clear. No mastoid effusion. Inner ear structures grossly normal.  Bone marrow signal intensity within normal limits. Scalp soft tissues unremarkable.  MRA HEAD FINDINGS  ANTERIOR CIRCULATION:  Visualized distal cervical segments of the internal carotid arteries are widely patent with antegrade flow. The petrous, cavernous, and supraclinoid segments are widely patent. A1 segments, anterior communicating artery, and anterior cerebral arteries well opacified bilaterally.  M1 segments widely patent without stenosis or occlusion. MCA bifurcations within normal limits. Distal MCA branches demonstrate multi focal atheromatous irregularity.  POSTERIOR CIRCULATION:  Right vertebral artery is dominant and widely patent to the vertebrobasilar junction. Atheromatous irregularity present within the distal left vertebral artery. Posterior inferior cerebral arteries patent. Basilar artery demonstrates mild multi focal atheromatous irregularity without high-grade  stenosis or occlusion. Superior cerebral arteries patent bilaterally. Posterior sore wall arteries well opacified bilaterally. Prominent posterior communicating arteries with hypoplastic P1 segments present. Distal branch atheromatous irregularity within the PCA branches bilaterally.  No aneurysm or vascular malformation. Mild ectasia at the left MCA bifurcation is stable appear  IMPRESSION: MRI HEAD IMPRESSION:  1. No acute intracranial infarct or other abnormality identified. 2. Generalized age-related  cerebral atrophy with moderate chronic microvascular ischemic disease. Small remote lacunar infarcts within the bilateral basal ganglia and thalami suspected.  MRA HEAD IMPRESSION:  1. No large or proximal arterial branch occlusion identified within the intracranial circulation. No hemodynamically significant or correctable stenosis. 2. Mild atheromatous irregularity within the vertebrobasilar system as well as the distal MCA and PCA branches bilaterally.   Electronically Signed   By: Rise Mu M.D.   On: 09/16/2014 00:05   Mr Brain Wo Contrast  09/16/2014   CLINICAL DATA:  Initial evaluation for acute dysarthria, double vision, generalized weakness.  EXAM: MRI HEAD WITHOUT CONTRAST  MRA HEAD WITHOUT CONTRAST  TECHNIQUE: Multiplanar, multiecho pulse sequences of the brain and surrounding structures were obtained without intravenous contrast. Angiographic images of the head were obtained using MRA technique without contrast.  COMPARISON:  Prior CT from earlier the same day.  FINDINGS: MRI HEAD FINDINGS  Diffuse prominence of the CSF containing spaces is compatible with generalized age-related cerebral atrophy. Patchy and confluent T2/FLAIR hyperintensity within the periventricular and deep white matter both cerebral hemispheres most consistent with chronic small vessel ischemic disease. Probable remote lacunar infarcts within the bilateral basal ganglia and thalami.  No abnormal foci of restricted diffusion to suggest acute intracranial infarct. Gray-white matter differentiation maintained. Normal intravascular flow voids are preserved. No acute or chronic intracranial hemorrhage.  No mass lesion or midline shift. No mass effect. No hydrocephalus. No extra-axial fluid collection para  Craniocervical junction within normal limits. Degenerative spondylolysis noted within the upper cervical spine without significant stenosis. Pituitary gland within normal limits.  No acute abnormality about the orbits.  Sequelae of prior bilateral cataract extraction noted.  Mild mucosal thickening within the ethmoidal air cells. Cells and maxillary sinuses. Paranasal sinuses are otherwise clear. No mastoid effusion. Inner ear structures grossly normal.  Bone marrow signal intensity within normal limits. Scalp soft tissues unremarkable.  MRA HEAD FINDINGS  ANTERIOR CIRCULATION:  Visualized distal cervical segments of the internal carotid arteries are widely patent with antegrade flow. The petrous, cavernous, and supraclinoid segments are widely patent. A1 segments, anterior communicating artery, and anterior cerebral arteries well opacified bilaterally.  M1 segments widely patent without stenosis or occlusion. MCA bifurcations within normal limits. Distal MCA branches demonstrate multi focal atheromatous irregularity.  POSTERIOR CIRCULATION:  Right vertebral artery is dominant and widely patent to the vertebrobasilar junction. Atheromatous irregularity present within the distal left vertebral artery. Posterior inferior cerebral arteries patent. Basilar artery demonstrates mild multi focal atheromatous irregularity without high-grade stenosis or occlusion. Superior cerebral arteries patent bilaterally. Posterior sore wall arteries well opacified bilaterally. Prominent posterior communicating arteries with hypoplastic P1 segments present. Distal branch atheromatous irregularity within the PCA branches bilaterally.  No aneurysm or vascular malformation. Mild ectasia at the left MCA bifurcation is stable appear  IMPRESSION: MRI HEAD IMPRESSION:  1. No acute intracranial infarct or other abnormality identified. 2. Generalized age-related cerebral atrophy with moderate chronic microvascular ischemic disease. Small remote lacunar infarcts within the bilateral basal ganglia and thalami suspected.  MRA HEAD IMPRESSION:  1. No large or proximal arterial branch occlusion identified within  the intracranial circulation. No hemodynamically  significant or correctable stenosis. 2. Mild atheromatous irregularity within the vertebrobasilar system as well as the distal MCA and PCA branches bilaterally.   Electronically Signed   By: Rise Mu M.D.   On: 09/16/2014 00:05    Scheduled Meds: .  stroke: mapping our early stages of recovery book   Does not apply Once  . aspirin EC  81 mg Oral Daily  . aspirin  300 mg Rectal Daily   Or  . aspirin  325 mg Oral Daily  . atorvastatin  20 mg Oral Daily  . carvedilol  6.25 mg Oral BID WC  . cholecalciferol  2,000 Units Oral q morning - 10a  . enoxaparin (LOVENOX) injection  30 mg Subcutaneous Daily  . furosemide  40 mg Oral Daily  . gabapentin  300 mg Oral BID  . levothyroxine  100 mcg Oral QAC breakfast  . loratadine  10 mg Oral Daily  . losartan  100 mg Oral q morning - 10a  . magnesium oxide  400 mg Oral QHS  . modafinil  200 mg Oral Q breakfast  . oxybutynin  5 mg Oral QHS  . pantoprazole  40 mg Oral Daily  . ranolazine  1,000 mg Oral BID  . spironolactone  25 mg Oral Daily  . vitamin C  1,000 mg Oral q morning - 10a   Continuous Infusions:   Principal Problem:   TIA (transient ischemic attack) Active Problems:   Obstructive sleep apnea   Hypothyroid   Hypertension   Dementia    Time spent: >35 minutes     Esperanza Sheets  [facility] Pager [phone]. If 7PM-7AM, please contact night-coverage at [email or web address], password [email or web address] 09/16/2014, 9:53 AM

## 2014-09-17 DIAGNOSIS — I10 Essential (primary) hypertension: Secondary | ICD-10-CM | POA: Diagnosis not present

## 2014-09-17 DIAGNOSIS — R251 Tremor, unspecified: Secondary | ICD-10-CM | POA: Diagnosis not present

## 2014-09-17 DIAGNOSIS — E039 Hypothyroidism, unspecified: Secondary | ICD-10-CM | POA: Diagnosis not present

## 2014-09-17 LAB — TROPONIN I

## 2014-09-17 LAB — HEMOGLOBIN A1C
Hgb A1c MFr Bld: 6.1 % — ABNORMAL HIGH (ref 4.8–5.6)
MEAN PLASMA GLUCOSE: 128 mg/dL

## 2014-09-17 LAB — BASIC METABOLIC PANEL
Anion gap: 5 (ref 5–15)
BUN: 22 mg/dL — AB (ref 6–20)
CO2: 24 mmol/L (ref 22–32)
CREATININE: 1.14 mg/dL — AB (ref 0.44–1.00)
Calcium: 9.5 mg/dL (ref 8.9–10.3)
Chloride: 108 mmol/L (ref 101–111)
GFR, EST AFRICAN AMERICAN: 49 mL/min — AB (ref >60)
GFR, EST NON AFRICAN AMERICAN: 42 mL/min — AB (ref >60)
Glucose, Bld: 102 mg/dL — ABNORMAL HIGH (ref 65–99)
POTASSIUM: 4.4 mmol/L (ref 3.5–5.1)
SODIUM: 137 mmol/L (ref 135–145)

## 2014-09-17 MED ORDER — CEFUROXIME AXETIL 500 MG PO TABS: 500.0000 mg | ORAL_TABLET | Freq: Two times a day (BID) | ORAL | Status: DC

## 2014-09-17 NOTE — Discharge Summary (Signed)
Physician Discharge Summary  Bailey Duke WUJ:[id number] DOB: 10-May-1925 DOA: 09/15/2014  PCP: [name], MD  Admit date: 09/15/2014 Discharge date: 09/17/2014  Recommendations for Outpatient Follow-up:  1. Back to ALF with PT/OT  Discharge Diagnoses:  Principal Problem:   UTI Active Problems:   Obstructive sleep apnea   Hypothyroid   Hypertension   Dementia Hyperlipidemia Spinal stenosis  Discharge Condition: stable  Diet recommendation: heart healthy  Filed Weights   09/15/14 1911  Weight: 91.1 kg (200 lb 13.4 oz)    History of present illness:  81 y.o. female with a history of HTN, Hyperlipidemia, OSA, and Dementia who presents to the ED with complaints of Weakness , blurry vision and slurred speech after eating lunch. She was brought to the ED from her [facility] as a Code Stroke. She was evaluated and had a Ct scan of the Head which was negative for acute findings and while in the ED her symptoms began to improve. She went for an MRI of the Brain and results are pending at this time. She was seen and evaluated by Neurology while in the ED.   Hospital Course:  Admitted to telemetry.  Neurology consulted and stroke workup recommended. Found to have UTI and started on Rocephin. Urine culture pending. Had no further episodes. Neurology doubts TIA. MRI without stroke. Echocardiogram without source of embolus. Carotid Dopplers without significant carotid stenosis. LDL 131. Patient may continue statin at discharge. May continue aspirin 81 mg at discharge. Antibiotics have been transitioned to Ceftin. Will complete a seven-day course. Hemoglobin A1c 6.1. Worked with physical therapy and occupational therapy who recommend back to assisted-living with PT and OT.  Lipid Panel     Component Value Date/Time   CHOL 205* 09/16/2014 0020   TRIG 59 09/16/2014 0020   HDL 62 09/16/2014 0020   CHOLHDL 3.3 09/16/2014 0020   VLDL 12 09/16/2014 0020   LDLCALC 131*  09/16/2014 0020     Procedures:  None  Consultations:  Neurology  Discharge Exam: Filed Vitals:   09/17/14 0926  BP: 145/65  Pulse: 70  Temp: 97.8 F (36.6 C)  Resp: 18    General: a and o Cardiovascular: RR Respiratory: CTA Neuro: nonfocal  Discharge Instructions   Discharge Instructions    Diet - low sodium heart healthy    Complete by:  As directed      Increase activity slowly    Complete by:  As directed           Current Discharge Medication List    START taking these medications   Details  cefUROXime (CEFTIN) 500 MG tablet Take 1 tablet (500 mg total) by mouth 2 (two) times daily with a meal. Qty: 8 tablet, Refills: 0      CONTINUE these medications which have NOT CHANGED   Details  aspirin EC 81 MG tablet Take 81 mg by mouth daily.    atorvastatin (LIPITOR) 20 MG tablet Take 20 mg by mouth daily.    fluticasone (FLONASE) 50 MCG/ACT nasal spray Place 1 spray into both nostrils daily.    furosemide (LASIX) 20 MG tablet Take 40 mg by mouth daily.    gabapentin (NEURONTIN) 300 MG capsule Take 1 capsule in the morning and 2 capsules in the evening.    modafinil (PROVIGIL) 200 MG tablet Take 200 mg by mouth daily.    RA KRILL OIL 500 MG CAPS Take 1 capsule by mouth daily.    spironolactone (ALDACTONE) 25 MG tablet Take 25 mg  by mouth daily.    traMADol (ULTRAM) 50 MG tablet Take 50 mg by mouth daily.    acetaminophen (TYLENOL) 325 MG tablet Take 650 mg by mouth every 4 (four) hours as needed for mild pain or fever.    amLODipine (NORVASC) 5 MG tablet Take 5 mg by mouth every morning.    Armodafinil (NUVIGIL) 250 MG tablet Take 1 tablet (250 mg total) by mouth every morning. Qty: 30 tablet, Refills: 2    camphor-menthol (SARNA) lotion Apply 1 application topically 2 (two) times daily as needed for itching.    carvedilol (COREG) 6.25 MG tablet Take 6.25 mg by mouth 2 (two) times daily with a meal.    cetirizine (ZYRTEC) 10 MG tablet Take 10  mg by mouth daily as needed for allergies or rhinitis.    Cholecalciferol (VITAMIN D) 2000 UNITS CAPS Take 2,000 Units by mouth every morning.    cyanocobalamin (,VITAMIN B-12,) 1000 MCG/ML injection Inject 1,000 mcg into the muscle every 30 (thirty) days.    diclofenac sodium (VOLTAREN) 1 % GEL Apply 4 g topically every 6 (six) hours as needed (for lumbosacral spondylosis).    HYDROcodone-acetaminophen (NORCO/VICODIN) 5-325 MG per tablet Take 1 tablet by mouth every 6 (six) hours as needed for moderate pain. Qty: 30 tablet, Refills: 0    levothyroxine (SYNTHROID, LEVOTHROID) 100 MCG tablet Take 100 mcg by mouth daily before breakfast.    Liniments (PENETRAN PLUS EX) Apply 1 application topically 3 (three) times daily as needed (for joint pain).    losartan (COZAAR) 100 MG tablet Take 100 mg by mouth every morning.    magnesium oxide (MAG-OX) 400 MG tablet Take 400 mg by mouth at bedtime.    meclizine (ANTIVERT) 25 MG tablet Take 25 mg by mouth 3 (three) times daily as needed for dizziness.    Menthol, Topical Analgesic, (BIOFREEZE) 4 % GEL Apply 1 application topically 3 (three) times daily as needed (for joint pain).    Multiple Vitamins-Minerals (MULTIVITAMIN PO) Take 1 tablet by mouth every morning.    omeprazole (PRILOSEC) 20 MG capsule Take 20 mg by mouth daily.    oxybutynin (DITROPAN-XL) 5 MG 24 hr tablet Take 5 mg by mouth at bedtime.    !! RANEXA 500 MG 12 hr tablet Take 500 mg by mouth 2 (two) times daily.    !! ranolazine (RANEXA) 1000 MG SR tablet Take 1,000 mg by mouth 2 (two) times daily.    Soybean Lecithin (PHOSPHATIDYLSERINE) 40 % POWD Take 500 mg by mouth 3 (three) times daily.    Turmeric (RA TURMERIC) 500 MG CAPS Take 500 mg by mouth daily.    Turmeric, Curcuma Longa, (CURCUMIN EXTRACT) POWD 1 application by Does not apply route 3 (three) times daily.    vitamin C (ASCORBIC ACID) 500 MG tablet Take 1,000 mg by mouth every morning.     !! - Potential  duplicate medications found. Please discuss with provider.    STOP taking these medications     aspirin 325 MG tablet        Allergies  Allergen Reactions  . Tetanus Toxoids Other (See Comments)    RED LINE FROM INJECTION SITE DOWN ARM  . Penicillins Other (See Comments)    1940 FORMS - TINY ITCHY PIMPLES ON FINGERS  . Procaine Other (See Comments)    ?    Marland Kitchen Sulfa Antibiotics    Follow-up Information    Follow up with [facility], MD.   Specialty:  Internal Medicine  Contact information:   [address] [place] [address] [phone]        The results of significant diagnostics from this hospitalization (including imaging, microbiology, ancillary and laboratory) are listed below for reference.    Significant Diagnostic Studies: Dg Chest 2 View  09/15/2014   CLINICAL DATA:  Altered mental status kid patient reports feeling bad and shaking uncontrolled knee for the last 2 days. History of CHF and hypertension.  EXAM: CHEST  2 VIEW  COMPARISON:  07/30/2014  FINDINGS: Mild enlargement of the cardiopericardial silhouette is stable. No mediastinal or hilar masses or evidence of adenopathy.  There are prominent interstitial markings bilaterally, stable. Mild linear scarring is noted in the left mid lung, also stable. No lung consolidation or edema. No pleural effusion or pneumothorax.  Skeletal structures are diffusely demineralized. There arthropathic changes of the right glenohumeral joint, stable.  IMPRESSION: 1. No acute cardiopulmonary disease. 2. Stable cardiomegaly.   Electronically Signed   By: Amie Portland M.D.   On: 09/15/2014 21:57   Ct Head Wo Contrast  09/15/2014   CLINICAL DATA:  Generalize weakness, slurred speech since 1345 hr  EXAM: CT HEAD WITHOUT CONTRAST  TECHNIQUE: Contiguous axial images were obtained from the base of the skull through the vertex without intravenous contrast.  COMPARISON:  MRI brain 01/26/2013.  CT head 01/24/2013.  FINDINGS:  Diffuse cerebral atrophy. No ventricular dilatation. Scattered low-attenuation change in the white matter consistent with small vessel ischemic change. No mass effect or midline shift. No abnormal extra-axial fluid collections. Gray-white matter junctions are distinct. Basal cisterns are not effaced. No evidence of acute intracranial hemorrhage. No depressed skull fractures. Visualized paranasal sinuses and mastoid air cells are not opacified. Vascular calcifications.  IMPRESSION: No acute intracranial abnormalities. Mild chronic atrophy and small vessel ischemic changes.  These results were called by telephone at the time of interpretation on 09/15/2014 at 7:24 pm to Dr. Hyacinth Meeker, who verbally acknowledged these results.   Electronically Signed   By: Burman Nieves M.D.   On: 09/15/2014 19:31   Mr [name] Head Wo Contrast  09/16/2014   CLINICAL DATA:  Initial evaluation for acute dysarthria, double vision, generalized weakness.  EXAM: MRI HEAD WITHOUT CONTRAST  MRA HEAD WITHOUT CONTRAST  TECHNIQUE: Multiplanar, multiecho pulse sequences of the brain and surrounding structures were obtained without intravenous contrast. Angiographic images of the head were obtained using MRA technique without contrast.  COMPARISON:  Prior CT from earlier the same day.  FINDINGS: MRI HEAD FINDINGS  Diffuse prominence of the CSF containing spaces is compatible with generalized age-related cerebral atrophy. Patchy and confluent T2/FLAIR hyperintensity within the periventricular and deep white matter both cerebral hemispheres most consistent with chronic small vessel ischemic disease. Probable remote lacunar infarcts within the bilateral basal ganglia and thalami.  No abnormal foci of restricted diffusion to suggest acute intracranial infarct. Gray-white matter differentiation maintained. Normal intravascular flow voids are preserved. No acute or chronic intracranial hemorrhage.  No mass lesion or midline shift. No mass effect. No  hydrocephalus. No extra-axial fluid collection para  Craniocervical junction within normal limits. Degenerative spondylolysis noted within the upper cervical spine without significant stenosis. Pituitary gland within normal limits.  No acute abnormality about the orbits. Sequelae of prior bilateral cataract extraction noted.  Mild mucosal thickening within the ethmoidal air cells. Cells and maxillary sinuses. Paranasal sinuses are otherwise clear. No mastoid effusion. Inner ear structures grossly normal.  Bone marrow signal intensity within normal limits. Scalp soft tissues unremarkable.  MRA HEAD FINDINGS  ANTERIOR CIRCULATION:  Visualized distal cervical segments of the internal carotid arteries are widely patent with antegrade flow. The petrous, cavernous, and supraclinoid segments are widely patent. A1 segments, anterior communicating artery, and anterior cerebral arteries well opacified bilaterally.  M1 segments widely patent without stenosis or occlusion. MCA bifurcations within normal limits. Distal MCA branches demonstrate multi focal atheromatous irregularity.  POSTERIOR CIRCULATION:  Right vertebral artery is dominant and widely patent to the vertebrobasilar junction. Atheromatous irregularity present within the distal left vertebral artery. Posterior inferior cerebral arteries patent. Basilar artery demonstrates mild multi focal atheromatous irregularity without high-grade stenosis or occlusion. Superior cerebral arteries patent bilaterally. Posterior sore wall arteries well opacified bilaterally. Prominent posterior communicating arteries with hypoplastic P1 segments present. Distal branch atheromatous irregularity within the PCA branches bilaterally.  No aneurysm or vascular malformation. Mild ectasia at the left MCA bifurcation is stable appear  IMPRESSION: MRI HEAD IMPRESSION:  1. No acute intracranial infarct or other abnormality identified. 2. Generalized age-related cerebral atrophy with moderate  chronic microvascular ischemic disease. Small remote lacunar infarcts within the bilateral basal ganglia and thalami suspected.  MRA HEAD IMPRESSION:  1. No large or proximal arterial branch occlusion identified within the intracranial circulation. No hemodynamically significant or correctable stenosis. 2. Mild atheromatous irregularity within the vertebrobasilar system as well as the distal MCA and PCA branches bilaterally.   Electronically Signed   By: Rise Mu M.D.   On: 09/16/2014 00:05   Mr Brain Wo Contrast  09/16/2014   CLINICAL DATA:  Initial evaluation for acute dysarthria, double vision, generalized weakness.  EXAM: MRI HEAD WITHOUT CONTRAST  MRA HEAD WITHOUT CONTRAST  TECHNIQUE: Multiplanar, multiecho pulse sequences of the brain and surrounding structures were obtained without intravenous contrast. Angiographic images of the head were obtained using MRA technique without contrast.  COMPARISON:  Prior CT from earlier the same day.  FINDINGS: MRI HEAD FINDINGS  Diffuse prominence of the CSF containing spaces is compatible with generalized age-related cerebral atrophy. Patchy and confluent T2/FLAIR hyperintensity within the periventricular and deep white matter both cerebral hemispheres most consistent with chronic small vessel ischemic disease. Probable remote lacunar infarcts within the bilateral basal ganglia and thalami.  No abnormal foci of restricted diffusion to suggest acute intracranial infarct. Gray-white matter differentiation maintained. Normal intravascular flow voids are preserved. No acute or chronic intracranial hemorrhage.  No mass lesion or midline shift. No mass effect. No hydrocephalus. No extra-axial fluid collection para  Craniocervical junction within normal limits. Degenerative spondylolysis noted within the upper cervical spine without significant stenosis. Pituitary gland within normal limits.  No acute abnormality about the orbits. Sequelae of prior bilateral  cataract extraction noted.  Mild mucosal thickening within the ethmoidal air cells. Cells and maxillary sinuses. Paranasal sinuses are otherwise clear. No mastoid effusion. Inner ear structures grossly normal.  Bone marrow signal intensity within normal limits. Scalp soft tissues unremarkable.  MRA HEAD FINDINGS  ANTERIOR CIRCULATION:  Visualized distal cervical segments of the internal carotid arteries are widely patent with antegrade flow. The petrous, cavernous, and supraclinoid segments are widely patent. A1 segments, anterior communicating artery, and anterior cerebral arteries well opacified bilaterally.  M1 segments widely patent without stenosis or occlusion. MCA bifurcations within normal limits. Distal MCA branches demonstrate multi focal atheromatous irregularity.  POSTERIOR CIRCULATION:  Right vertebral artery is dominant and widely patent to the vertebrobasilar junction. Atheromatous irregularity present within the distal left vertebral artery. Posterior inferior cerebral arteries patent. Basilar artery demonstrates mild multi focal atheromatous  irregularity without high-grade stenosis or occlusion. Superior cerebral arteries patent bilaterally. Posterior sore wall arteries well opacified bilaterally. Prominent posterior communicating arteries with hypoplastic P1 segments present. Distal branch atheromatous irregularity within the PCA branches bilaterally.  No aneurysm or vascular malformation. Mild ectasia at the left MCA bifurcation is stable appear  IMPRESSION: MRI HEAD IMPRESSION:  1. No acute intracranial infarct or other abnormality identified. 2. Generalized age-related cerebral atrophy with moderate chronic microvascular ischemic disease. Small remote lacunar infarcts within the bilateral basal ganglia and thalami suspected.  MRA HEAD IMPRESSION:  1. No large or proximal arterial branch occlusion identified within the intracranial circulation. No hemodynamically significant or correctable  stenosis. 2. Mild atheromatous irregularity within the vertebrobasilar system as well as the distal MCA and PCA branches bilaterally.   Electronically Signed   By: Rise Mu M.D.   On: 09/16/2014 00:05   Echo Left ventricle: The cavity size was normal. Wall thickness was increased in a pattern of severe LVH. Systolic function was normal. The estimated ejection fraction was in the range of 50% to 55%. Doppler parameters are consistent with abnormal left ventricular relaxation (grade 1 diastolic dysfunction). - Aortic valve: There was mild regurgitation. - Mitral valve: There was mild regurgitation. - Left atrium: The atrium was mildly dilated. - Pulmonary arteries: PA peak pressure: 33 mm Hg (S).  Microbiology: No results found for this or any previous visit (from the past 240 hour(s)).   Labs: Basic Metabolic Panel:  Recent Labs Lab 09/15/14 1904 09/15/14 1914 09/17/14 0218  NA 137 136 137  K 4.6 4.8 4.4  CL 106 106 108  CO2  --  22 24  GLUCOSE 123* 123* 102*  BUN 38* 33* 22*  CREATININE 1.20* 1.32* 1.14*  CALCIUM  --  10.3 9.5   Liver Function Tests:  Recent Labs Lab 09/15/14 1914  AST 30  ALT 23  ALKPHOS 66  BILITOT 0.6  PROT 6.4*  ALBUMIN 3.7   No results for input(s): LIPASE, AMYLASE in the last 168 hours. No results for input(s): AMMONIA in the last 168 hours. CBC:  Recent Labs Lab 09/15/14 1904 09/15/14 1914  WBC  --  8.7  NEUTROABS  --  6.3  HGB 12.6 11.8*  HCT 37.0 35.2*  MCV  --  92.4  PLT  --  199   Cardiac Enzymes:  Recent Labs Lab 09/16/14 0920 09/16/14 1435 09/16/14 2115 09/17/14 0218 09/17/14 0812  TROPONINI <0.03 <0.03 <0.03 <0.03 <0.03   BNP: BNP (last 3 results) No results for input(s): BNP in the last 8760 hours.  ProBNP (last 3 results) No results for input(s): PROBNP in the last 8760 hours.  CBG:  Recent Labs Lab 09/15/14 1914  GLUCAP 105*       Signed:  Samuel Rittenhouse L  [facility] 09/17/2014, 1:09 PM

## 2014-09-17 NOTE — Progress Notes (Signed)
STROKE TEAM PROGRESS NOTE   SUBJECTIVE (INTERVAL HISTORY) Patient was glad to see Dr. Pearlean Brownie - welcomed him in - said she had some questions..,    OBJECTIVE Temp:  [97.4 F (36.3 C)-98.5 F (36.9 C)] 97.8 F (36.6 C) (08/23 0926) Pulse Rate:  [57-73] 70 (08/23 0926) Cardiac Rhythm:  [-] Normal sinus rhythm;Heart block (08/23 0753) Resp:  [15-22] 18 (08/23 0926) BP: (111-146)/(52-93) 145/65 mmHg (08/23 0926) SpO2:  [94 %-100 %] 97 % (08/23 0926)   Recent Labs Lab 09/15/14 1904 09/15/14 1914 09/17/14 0218  NA 137 136 137  K 4.6 4.8 4.4  CL 106 106 108  CO2  --  22 24  GLUCOSE 123* 123* 102*  BUN 38* 33* 22*  CREATININE 1.20* 1.32* 1.14*  CALCIUM  --  10.3 9.5    Recent Labs Lab 09/16/14 0920 09/16/14 1435 09/16/14 2115 09/17/14 0218 09/17/14 0812  TROPONINI <0.03 <0.03 <0.03 <0.03 <0.03        Component Value Date/Time   CHOL 205* 09/16/2014 0020   TRIG 59 09/16/2014 0020   HDL 62 09/16/2014 0020   CHOLHDL 3.3 09/16/2014 0020   VLDL 12 09/16/2014 0020   LDLCALC 131* 09/16/2014 0020   Lab Results  Component Value Date   HGBA1C 6.1* 09/15/2014      Component Value Date/Time   LABOPIA NONE DETECTED 01/25/2013 1033   COCAINSCRNUR NONE DETECTED 01/25/2013 1033   LABBENZ NONE DETECTED 01/25/2013 1033   AMPHETMU NONE DETECTED 01/25/2013 1033   THCU NONE DETECTED 01/25/2013 1033   LABBARB NONE DETECTED 01/25/2013 1033     IMAGING  Dg Chest 2 View 09/15/2014   1. No acute cardiopulmonary disease. 2. Stable cardiomegaly.      Ct Head Wo Contrast 09/15/2014    No acute intracranial abnormalities. Mild chronic atrophy and small vessel ischemic changes.    MRI HEAD  09/16/2014    1. No acute intracranial infarct or other abnormality identified. 2. Generalized age-related cerebral atrophy with moderate chronic microvascular ischemic disease. Small remote lacunar infarcts within the bilateral basal ganglia and thalami suspected.    MRA HEAD  09/16/2014    1. No  large or proximal arterial branch occlusion identified within the intracranial circulation. No hemodynamically significant or correctable stenosis. 2. Mild atheromatous irregularity within the vertebrobasilar system as well as the distal MCA and PCA branches bilaterally.     2D ECHOCARDIOGRAM   - Left ventricle: The cavity size was normal. Wall thickness wasincreased in a pattern of severe LVH. Systolic function wasnormal. The estimated ejection fraction was in the range of 50%to 55%. Doppler parameters are consistent with abnormal leftventricular relaxation (grade 1 diastolic dysfunction). - Aortic valve: There was mild regurgitation. - Mitral valve: There was mild regurgitation. - Left atrium: The atrium was mildly dilated. - Pulmonary arteries: PA peak pressure: 33 mm Hg (S).  CAROTID DOPPLER  There is 1-39% bilateral ICA stenosis. Vertebral artery flow is antegrade.     PHYSICAL EXAM Pleasant elderly Caucasian lady currently not in distress. . Afebrile. Head is nontraumatic. Neck is supple without bruit.    Cardiac exam no murmur or gallop. Lungs are clear to auscultation. Distal pulses are well felt. Neurological Exam :  Awake alert oriented 2. Diminished attention, registration and recall. Speech is fluent without aphasia or dysarthria. Follows commands well. Extraocular moments are full range without nystagmus. Blinks to threat bilaterally. Fundi were not visualized. Vision acuity seems adequate at bedside. Face is symmetric without weakness. Tongue is midline. Motor  system exam reveals symmetric upper and lower extremity strength without focal weakness. Deep tendon reflexes are symmetric. Plantars are downgoing. Touch sensation is preserved bilaterally. Gait was not tested.   ASSESSMENT/PLAN Ms. Passion Lavin is a 81 y.o. female with history of HTN, OSA, depression, anxiety, dementia presenting with acute onset dysarthria, double vision, shaking, generalikzed weakness. She did not  receive IV t-PA due to late presentation.   Tremulous symptoms most likely related to acute UTI and/or spinal stenosis and or new herbal medication. Cannot rule out anxiety contribution. No stroke. No TIA.  MRI  No acute stroke  MRA  No large vessel stenosis  Carotid Doppler  No significant stenosis   2D Echo  No source of embolus   LDL 131  HgbA1c 6.1  Lovenox 30 mg sq daily for VTE prophylaxis Diet Heart Room service appropriate?: Yes; Fluid consistency:: Thin  aspirin 81 mg orally every day prior to admission, now on aspirin 81 mg orally every day and aspirin 325 mg orally every day. Continue 81 mg at discharge  Stop taking herbal medicaton  Therapy recommendations:  HH PT and OT  Disposition:  Return to AL with HH therapies (resides in AL PTA)  Essential Hypertension  Stable  Hyperlipidemia  Home meds:  lipitor 20 mg, resumed in hospital  LDL 131, goal < 70  Continue statin at discharge  Other Stroke Risk Factors  Advanced age  Obesity, Body mass index is 35.59 kg/(m^2).   Obstructive sleep apnea, on CPAP at home  Other Active Problems  Baseline dementia  UTI on rocephin  AKI  Severe spinal stenosis L4-L5, seeing NS at [facility], discussing OR  NOTHING FURTHER TO ADD FROM THE STROKE STANDPOINT  Stroke Service will sign off. Please call should any needs arise.  No stroke Follow-up indicated  Hospital day #   Rhoderick Moody [facility] See Amion for Pager information 09/17/2014 9:48 AM  I have personally examined this patient, reviewed notes, independently viewed imaging studies, participated in medical decision making and plan of care. I have made any additions or clarifications directly to the above note. Agree with note above.  Discussed with Dr. David Stall, MD Medical Director [facility] Pager: [phone] 09/17/2014 2:22 PM  To contact Stroke Continuity provider, please refer to  [email or web address]. After hours, contact General Neurology

## 2014-09-17 NOTE — Progress Notes (Signed)
PT Cancellation Note  Patient Details Name: Bailey Duke MRN: [id number] DOB: November 07, 1925   Cancelled Treatment:    Reason Eval/Treat Not Completed: Other (comment). Attempted to see patient this afternoon however she was about to get dressed and ready for DC and at this time refused any further walking with therapy.    Fredrich Birks 09/17/2014, 2:53 PM

## 2014-09-17 NOTE — Progress Notes (Signed)
Pt discharged at this time back to independent living.  In no distress.  All belongings, including glasses are with patient.  She verbalizes understanding of her discharge orders.  Prescription and discharge papers given to patient.

## 2014-09-17 NOTE — Progress Notes (Signed)
SLP Cancellation Note  Patient Details Name: Bailey Duke MRN: [id number] DOB: 08/25/1925   Cancelled treatment:       Reason Eval/Treat Not Completed: SLP screened, no needs identified.  MRI revealed no acute intracranial infarct or other abnormality identified and patient reports that she is at baseline.  SLP signing off please reorder if need arises.    Fae Pippin, M.A., CCC-SLP [phone]  Javiel Canepa 09/17/2014, 1:40 PM

## 2014-09-17 NOTE — Progress Notes (Signed)
Pt states she will not see Dr. Guerry Bruin for follow up.  She sees Murlean Hark, NP at [facility].

## 2014-09-17 NOTE — Care Management Note (Signed)
Case Management Note  Patient Details  Name: Bailey Duke MRN: [id number] Date of Birth: July 18, 1925  Subjective/Objective:                    Action/Plan: Met with patient to discuss discharge planning. Patient is from [place].  She has previously used [facility] and has had services through [facility] (provided through her IL[facility]).  Patient feels that she has adequate knowledge/information regarding PT/OT.  She is currently declining home health services, but states that she will follow with her PCP this week to further discuss.  Bedside RN updated.  Expected Discharge Date:                  Expected Discharge Plan:  Home/Self Care (Patient is from an [place])  In-House Referral:     Discharge planning Services  CM Consult  Post Acute Care Choice:    Choice offered to:  Patient  DME Arranged:    DME Agency:     HH Arranged:  Patient Refused [facility] Agency:     Status of Service:  Completed, signed off  Medicare Important Message Given:    Date Medicare IM Given:    Medicare IM give by:    Date Additional Medicare IM Given:    Additional Medicare Important Message give by:     If discussed at [place] of Stay Meetings, dates discussed:    Additional Comments:  Rolm Baptise, RN 09/17/2014, 2:11 PM

## 2014-09-18 LAB — URINE CULTURE: CULTURE: NO GROWTH

## 2014-11-19 DIAGNOSIS — Q245 Malformation of coronary vessels: Secondary | ICD-10-CM | POA: Insufficient documentation

## 2015-01-19 ENCOUNTER — Emergency Department (HOSPITAL_COMMUNITY): Payer: Medicare Other

## 2015-01-19 ENCOUNTER — Encounter (HOSPITAL_COMMUNITY): Payer: Self-pay | Admitting: Emergency Medicine

## 2015-01-19 ENCOUNTER — Emergency Department (HOSPITAL_COMMUNITY)
Admission: EM | Admit: 2015-01-19 | Discharge: 2015-01-19 | Disposition: A | Discharge status: Home / Self Care | Payer: Medicare Other | Attending: Emergency Medicine | Admitting: Emergency Medicine

## 2015-01-19 DIAGNOSIS — Z794 Long term (current) use of insulin: Secondary | ICD-10-CM | POA: Insufficient documentation

## 2015-01-19 DIAGNOSIS — I10 Essential (primary) hypertension: Secondary | ICD-10-CM | POA: Diagnosis not present

## 2015-01-19 DIAGNOSIS — Y9289 Other specified places as the place of occurrence of the external cause: Secondary | ICD-10-CM | POA: Insufficient documentation

## 2015-01-19 DIAGNOSIS — G4730 Sleep apnea, unspecified: Secondary | ICD-10-CM | POA: Diagnosis not present

## 2015-01-19 DIAGNOSIS — Y9389 Activity, other specified: Secondary | ICD-10-CM | POA: Insufficient documentation

## 2015-01-19 DIAGNOSIS — W01198A Fall on same level from slipping, tripping and stumbling with subsequent striking against other object, initial encounter: Secondary | ICD-10-CM | POA: Diagnosis not present

## 2015-01-19 DIAGNOSIS — Z880 Allergy status to penicillin: Secondary | ICD-10-CM | POA: Insufficient documentation

## 2015-01-19 DIAGNOSIS — Z9981 Dependence on supplemental oxygen: Secondary | ICD-10-CM | POA: Diagnosis not present

## 2015-01-19 DIAGNOSIS — K219 Gastro-esophageal reflux disease without esophagitis: Secondary | ICD-10-CM | POA: Diagnosis not present

## 2015-01-19 DIAGNOSIS — S0990XA Unspecified injury of head, initial encounter: Secondary | ICD-10-CM | POA: Diagnosis not present

## 2015-01-19 DIAGNOSIS — I2510 Atherosclerotic heart disease of native coronary artery without angina pectoris: Secondary | ICD-10-CM | POA: Insufficient documentation

## 2015-01-19 DIAGNOSIS — E039 Hypothyroidism, unspecified: Secondary | ICD-10-CM | POA: Insufficient documentation

## 2015-01-19 DIAGNOSIS — Z79899 Other long term (current) drug therapy: Secondary | ICD-10-CM | POA: Diagnosis not present

## 2015-01-19 DIAGNOSIS — E785 Hyperlipidemia, unspecified: Secondary | ICD-10-CM | POA: Diagnosis not present

## 2015-01-19 DIAGNOSIS — Y998 Other external cause status: Secondary | ICD-10-CM | POA: Diagnosis not present

## 2015-01-19 DIAGNOSIS — I5020 Unspecified systolic (congestive) heart failure: Secondary | ICD-10-CM | POA: Diagnosis not present

## 2015-01-19 DIAGNOSIS — W19XXXA Unspecified fall, initial encounter: Secondary | ICD-10-CM

## 2015-01-19 LAB — BASIC METABOLIC PANEL
Anion gap: 9 (ref 5–15)
BUN: 23 mg/dL — ABNORMAL HIGH (ref 6–20)
CO2: 23 mmol/L (ref 22–32)
Calcium: 10.3 mg/dL (ref 8.9–10.3)
Chloride: 107 mmol/L (ref 101–111)
Creatinine, Ser: 1.19 mg/dL — ABNORMAL HIGH (ref 0.44–1.00)
GFR calc Af Amer: 46 mL/min — ABNORMAL LOW (ref >60)
GFR calc non Af Amer: 40 mL/min — ABNORMAL LOW (ref >60)
Glucose, Bld: 113 mg/dL — ABNORMAL HIGH (ref 65–99)
Potassium: 4.5 mmol/L (ref 3.5–5.1)
Sodium: 139 mmol/L (ref 135–145)

## 2015-01-19 LAB — CBC WITH DIFFERENTIAL/PLATELET
Basophils Absolute: 0 10*3/uL (ref 0.0–0.1)
Basophils Relative: 0 %
Eosinophils Absolute: 0.2 10*3/uL (ref 0.0–0.7)
Eosinophils Relative: 2 %
HCT: 37.8 % (ref 36.0–46.0)
Hemoglobin: 12.3 g/dL (ref 12.0–15.0)
Lymphocytes Relative: 21 %
Lymphs Abs: 2.4 10*3/uL (ref 0.7–4.0)
MCH: 30.7 pg (ref 26.0–34.0)
MCHC: 32.5 g/dL (ref 30.0–36.0)
MCV: 94.3 fL (ref 78.0–100.0)
Monocytes Absolute: 0.5 10*3/uL (ref 0.1–1.0)
Monocytes Relative: 5 %
Neutro Abs: 7.9 10*3/uL — ABNORMAL HIGH (ref 1.7–7.7)
Neutrophils Relative %: 72 %
Platelets: 193 10*3/uL (ref 150–400)
RBC: 4.01 MIL/uL (ref 3.87–5.11)
RDW: 13.5 % (ref 11.5–15.5)
WBC: 11 10*3/uL — ABNORMAL HIGH (ref 4.0–10.5)

## 2015-01-19 NOTE — ED Notes (Signed)
Patient transported to CT 

## 2015-01-19 NOTE — Discharge Instructions (Signed)

## 2015-01-19 NOTE — ED Notes (Signed)
[facility] security driver has arrived to pick up pt to transport her back to facility.

## 2015-01-19 NOTE — ED Provider Notes (Signed)
CSN: [id number]     Arrival date & time 01/19/15  1335 History   First MD Initiated Contact with Patient 01/19/15 1346     Chief Complaint  Patient presents with  . Fall    HPI   81 year old female presents today status post fall. Patient reports that she was drying off after taking a shower when she looked at her watch lost balance and fell. She remembers the fall hitting her head on the wall. She reports that she had difficulty getting back up to her feet and was crawling around on her knees and tell staff at the facility was able to assist her. She denies any dizziness, chest pain, diaphoresis prior to the fall, reports that she has fallen previously. She reports minor pain to the back of the head, but denies any other musculoskeletal injuries. She reports full active range of motion of the neck and back, upper extremities and lower extremities atraumatic. Patient reports taking 81 mg of aspirin  Patient reports she had a L4-L5 decompression on 11/27/2014 and denies any complaints of lower back pain. She reports most of her pain was radicular pain that is improved after the surgery.   Past Medical History  Diagnosis Date  . Hypertension   . CAD (coronary artery disease)   . Systolic CHF (HCC)   . Essential hypertension   . GERD (gastroesophageal reflux disease)   . Hyperlipidemia   . Spinal stenosis   . Osteoarthritis   . Sleep apnea     on CPAP qhs  . Hypothyroid   . Benign paroxysmal vertigo   . Proliferative retinopathy of left eye     Non-Diabetic  . B12 deficiency anemia   . Vitamin D deficiency   . OAB (overactive bladder)   . Hypersomnia    Past Surgical History  Procedure Laterality Date  . Joint replacement     Family History  Problem Relation Age of Onset  . Breast cancer Mother   . Other Father     Hardening of the arteries to the head   Social History  Substance Use Topics  . Smoking status: Never Smoker   . Smokeless tobacco: None  . Alcohol Use: 1.2  oz/week    2 Shots of liquor per week   OB History    No data available     Review of Systems  All other systems reviewed and are negative.   Allergies  Tetanus toxoids; Penicillins; Procaine; and Sulfa antibiotics  Home Medications   Prior to Admission medications   Medication Sig Start Date End Date Taking? Authorizing Provider  acetaminophen (TYLENOL) 325 MG tablet Take 650 mg by mouth every 4 (four) hours as needed for mild pain or fever.    Historical Provider, MD  amLODipine (NORVASC) 5 MG tablet Take 5 mg by mouth every morning.    Historical Provider, MD  Armodafinil (NUVIGIL) 250 MG tablet Take 1 tablet (250 mg total) by mouth every morning. 01/27/13   Meredeth Ide, MD  aspirin EC 81 MG tablet Take 81 mg by mouth daily. 03/06/14   Historical Provider, MD  atorvastatin (LIPITOR) 20 MG tablet Take 20 mg by mouth daily. 08/19/14 08/19/15  Historical Provider, MD  camphor-menthol Wynelle Fanny) lotion Apply 1 application topically 2 (two) times daily as needed for itching.    Historical Provider, MD  carvedilol (COREG) 6.25 MG tablet Take 6.25 mg by mouth 2 (two) times daily with a meal.    Historical Provider, MD  cefUROXime (CEFTIN)  500 MG tablet Take 1 tablet (500 mg total) by mouth 2 (two) times daily with a meal. 09/17/14   Christiane Ha, MD  cetirizine (ZYRTEC) 10 MG tablet Take 10 mg by mouth daily as needed for allergies or rhinitis.    Historical Provider, MD  Cholecalciferol (VITAMIN D) 2000 UNITS CAPS Take 2,000 Units by mouth every morning.    Historical Provider, MD  cyanocobalamin (,VITAMIN B-12,) 1000 MCG/ML injection Inject 1,000 mcg into the muscle every 30 (thirty) days.    Historical Provider, MD  diclofenac sodium (VOLTAREN) 1 % GEL Apply 4 g topically every 6 (six) hours as needed (for lumbosacral spondylosis).    Historical Provider, MD  fluticasone (FLONASE) 50 MCG/ACT nasal spray Place 1 spray into both nostrils daily. 03/05/14 03/05/15  Historical Provider, MD   furosemide (LASIX) 20 MG tablet Take 40 mg by mouth daily. 08/02/14 08/02/15  Historical Provider, MD  gabapentin (NEURONTIN) 300 MG capsule Take 1 capsule in the morning and 2 capsules in the evening. 03/18/14 03/18/16  Historical Provider, MD  HYDROcodone-acetaminophen (NORCO/VICODIN) 5-325 MG per tablet Take 1 tablet by mouth every 6 (six) hours as needed for moderate pain. 01/27/13   Meredeth Ide, MD  levothyroxine (SYNTHROID, LEVOTHROID) 100 MCG tablet Take 100 mcg by mouth daily before breakfast.    Historical Provider, MD  Liniments (PENETRAN PLUS EX) Apply 1 application topically 3 (three) times daily as needed (for joint pain).    Historical Provider, MD  losartan (COZAAR) 100 MG tablet Take 100 mg by mouth every morning.    Historical Provider, MD  magnesium oxide (MAG-OX) 400 MG tablet Take 400 mg by mouth at bedtime.    Historical Provider, MD  meclizine (ANTIVERT) 25 MG tablet Take 25 mg by mouth 3 (three) times daily as needed for dizziness.    Historical Provider, MD  Menthol, Topical Analgesic, (BIOFREEZE) 4 % GEL Apply 1 application topically 3 (three) times daily as needed (for joint pain).    Historical Provider, MD  modafinil (PROVIGIL) 200 MG tablet Take 200 mg by mouth daily. 08/26/14   Historical Provider, MD  Multiple Vitamins-Minerals (MULTIVITAMIN PO) Take 1 tablet by mouth every morning.    Historical Provider, MD  omeprazole (PRILOSEC) 20 MG capsule Take 20 mg by mouth daily.    Historical Provider, MD  oxybutynin (DITROPAN-XL) 5 MG 24 hr tablet Take 5 mg by mouth at bedtime.    Historical Provider, MD  RA KRILL OIL 500 MG CAPS Take 1 capsule by mouth daily. 04/01/14   Historical Provider, MD  ranolazine (RANEXA) 1000 MG SR tablet Take 1,000 mg by mouth 2 (two) times daily. 08/02/14 08/02/15  Historical Provider, MD  Soybean Lecithin (PHOSPHATIDYLSERINE) 40 % POWD Take 500 mg by mouth 3 (three) times daily.    Historical Provider, MD  spironolactone (ALDACTONE) 25 MG tablet Take 25 mg  by mouth daily. 03/06/14   Historical Provider, MD  traMADol (ULTRAM) 50 MG tablet Take 50 mg by mouth daily. 03/18/14 03/18/15  Historical Provider, MD  Turmeric (RA TURMERIC) 500 MG CAPS Take 500 mg by mouth daily.    Historical Provider, MD  Turmeric, Curcuma Longa, (CURCUMIN EXTRACT) POWD 1 application by Does not apply route 3 (three) times daily.    Historical Provider, MD  vitamin C (ASCORBIC ACID) 500 MG tablet Take 1,000 mg by mouth every morning.    Historical Provider, MD   BP 168/68 mmHg  Pulse 61  Temp(Src) 97.4 F (36.3 C) (Oral)  Resp 18  SpO2 99% Physical Exam  Constitutional: She is oriented to person, place, and time. She appears well-developed and well-nourished.  HENT:  Head: Normocephalic and atraumatic.  Eyes: Conjunctivae are normal. Pupils are equal, round, and reactive to light. Right eye exhibits no discharge. Left eye exhibits no discharge. No scleral icterus.  Neck: Normal range of motion. No JVD present. No tracheal deviation present.  Pulmonary/Chest: Effort normal. No stridor.  Abdominal: Soft. She exhibits no distension and no mass. There is no tenderness. There is no rebound and no guarding.  Neurological: She is alert and oriented to person, place, and time. Coordination normal.  Skin: Skin is warm and dry. No rash noted. No erythema. No pallor.  Psychiatric: She has a normal mood and affect. Her behavior is normal. Judgment and thought content normal.  Nursing note and vitals reviewed.     ED Course  Procedures (including critical care time) Labs Review Labs Reviewed  CBC WITH DIFFERENTIAL/PLATELET - Abnormal; Notable for the following:    WBC 11.0 (*)    Neutro Abs 7.9 (*)    All other components within normal limits  BASIC METABOLIC PANEL - Abnormal; Notable for the following:    Glucose, Bld 113 (*)    BUN 23 (*)    Creatinine, Ser 1.19 (*)    GFR calc non Af Amer 40 (*)    GFR calc Af Amer 46 (*)    All other components within normal limits     Imaging Review Ct Head Wo Contrast  01/19/2015  CLINICAL DATA:  Fall getting out of the shower, head and neck injury EXAM: CT HEAD WITHOUT CONTRAST CT CERVICAL SPINE WITHOUT CONTRAST TECHNIQUE: Multidetector CT imaging of the head and cervical spine was performed following the standard protocol without intravenous contrast. Multiplanar CT image reconstructions of the cervical spine were also generated. COMPARISON:  09/15/2014  . FINDINGS: CT HEAD FINDINGS Stable diffuse brain atrophy and chronic white matter microvascular ischemic changes throughout the cerebral hemispheres. No acute intracranial hemorrhage, mass lesion, definite infarction, midline shift, herniation, hydrocephalus, or extra-axial fluid collection. No focal mass effect or edema. Cisterns are patent. Cerebellar atrophy as well. Atherosclerosis of the intracranial vessels of the skullbase. Orbits are symmetric. Skull appears intact. Mastoids and sinuses appear clear. CT CERVICAL SPINE FINDINGS Severe diffuse cervical degenerative changes and spondylosis at all levels. Multilevel facet arthropathy. Alignment demonstrates grade 1 anterolisthesis of C4 on C5 measuring 5 mm secondary to facet arthropathy. No acute osseous finding or fracture. Degenerative changes of the C1-2 articulation. Normal prevertebral soft tissues. No soft tissue asymmetry or hematoma neck. Carotid atherosclerosis evident. Lung apices are clear. IMPRESSION: Chronic atrophy and white matter microvascular ischemic change without acute intracranial finding. Severe cervical degenerative changes and spondylosis with grade 1 C4 on C5 anterolisthesis secondary to facet arthropathy No acute osseous finding or fracture. Electronically Signed   By: Judie Petit.  Shick M.D.   On: 01/19/2015 16:08   Ct Cervical Spine Wo Contrast  01/19/2015  CLINICAL DATA:  Fall getting out of the shower, head and neck injury EXAM: CT HEAD WITHOUT CONTRAST CT CERVICAL SPINE WITHOUT CONTRAST TECHNIQUE:  Multidetector CT imaging of the head and cervical spine was performed following the standard protocol without intravenous contrast. Multiplanar CT image reconstructions of the cervical spine were also generated. COMPARISON:  09/15/2014  . FINDINGS: CT HEAD FINDINGS Stable diffuse brain atrophy and chronic white matter microvascular ischemic changes throughout the cerebral hemispheres. No acute intracranial hemorrhage, mass lesion, definite infarction,  midline shift, herniation, hydrocephalus, or extra-axial fluid collection. No focal mass effect or edema. Cisterns are patent. Cerebellar atrophy as well. Atherosclerosis of the intracranial vessels of the skullbase. Orbits are symmetric. Skull appears intact. Mastoids and sinuses appear clear. CT CERVICAL SPINE FINDINGS Severe diffuse cervical degenerative changes and spondylosis at all levels. Multilevel facet arthropathy. Alignment demonstrates grade 1 anterolisthesis of C4 on C5 measuring 5 mm secondary to facet arthropathy. No acute osseous finding or fracture. Degenerative changes of the C1-2 articulation. Normal prevertebral soft tissues. No soft tissue asymmetry or hematoma neck. Carotid atherosclerosis evident. Lung apices are clear. IMPRESSION: Chronic atrophy and white matter microvascular ischemic change without acute intracranial finding. Severe cervical degenerative changes and spondylosis with grade 1 C4 on C5 anterolisthesis secondary to facet arthropathy No acute osseous finding or fracture. Electronically Signed   By: Judie PetitM.  Shick M.D.   On: 01/19/2015 16:08   I have personally reviewed and evaluated these images and lab results as part of my medical decision-making.   EKG Interpretation   Date/Time:  Sunday January 19 2015 14:02:49 EST Ventricular Rate:  66 PR Interval:  205 QRS Duration: 97 QT Interval:  434 QTC Calculation: 455 R Axis:   2 Text Interpretation:  Sinus arrhythmia Ventricular premature complex  Abnormal inferior Q waves  Anterior infarct, old Since last tracing rate  slower Confirmed by Ethelda ChickJACUBOWITZ  MD, SAM [phone]([id number]) on 01/19/2015 2:15:12 PM      MDM   Final diagnoses:  Fall, initial encounter    Labs: CBC BMP  Imaging: CT head and CT cervical spine  Consults:  Therapeutics:  Discharge Meds:   Assessment/Plan: 81 year old female presents status post fall. This likely mechanical fall, no significant findings today that would necessitate further evaluation or management here in the ED. Patient will be discharged home with strict return cautioned. Patient was able to ambulate with minimal difficulty. Patient verbalized understanding and agreement for today's plan and had no further questions or concerns at time of discharge      Eyvonne MechanicJeffrey Alleyah Twombly, PA-C 01/22/15 1538  Doug SouSam Jacubowitz, MD 01/23/15 1739

## 2015-01-19 NOTE — ED Provider Notes (Signed)
Patient fell earlier today when attempting to reposition herself without using her walker. She reportedly struck her occiput is resolved the event. She presently eyes pain anywhere. She felt well before the event. She denies syncope or lightheadedness On exam she is alert and Glasgow Coma Score 15. HEENT exam normal cephalic atraumatic neck supple trachea midline no tenderness. Neurologic results from his well cranial nerve's 2 through 12 intact, walks with minimal assistance. Not lightheaded on standing. This is likely a mechanical fall the patient is unsteady on her feet. No need for further intervention. She is not prone to frequent falls and she is encouraged to use her walker at all times  Doug SouSam Daisee Centner, MD 01/19/15 [phone]

## 2015-01-19 NOTE — ED Notes (Signed)
[facility] Spoke with Joni Reiningicole advised patient d/c and advised they will provide transportation.

## 2015-01-19 NOTE — ED Notes (Signed)
Patient comes from [facility], Patient fell getting out the shower. Per EMS Patient hit her head on the wall. Patient speech at baseline. Patient denies any LOC. Patient alert on arrival. Patient had complaints of headache with EMS denies any Headache on arrival. Patient able to move all extertmites.

## 2015-01-27 ENCOUNTER — Encounter (INDEPENDENT_AMBULATORY_CARE_PROVIDER_SITE_OTHER): Payer: Medicare Other | Admitting: Ophthalmology

## 2015-01-27 DIAGNOSIS — H43813 Vitreous degeneration, bilateral: Secondary | ICD-10-CM

## 2015-01-27 DIAGNOSIS — H35372 Puckering of macula, left eye: Secondary | ICD-10-CM | POA: Diagnosis not present

## 2015-01-27 DIAGNOSIS — I10 Essential (primary) hypertension: Secondary | ICD-10-CM | POA: Diagnosis not present

## 2015-01-27 DIAGNOSIS — H353131 Nonexudative age-related macular degeneration, bilateral, early dry stage: Secondary | ICD-10-CM

## 2015-01-27 DIAGNOSIS — H35033 Hypertensive retinopathy, bilateral: Secondary | ICD-10-CM

## 2015-04-08 ENCOUNTER — Encounter (HOSPITAL_COMMUNITY): Payer: Self-pay | Admitting: Emergency Medicine

## 2015-04-08 ENCOUNTER — Emergency Department (HOSPITAL_COMMUNITY)
Admission: EM | Admit: 2015-04-08 | Discharge: 2015-04-08 | Disposition: A | Discharge status: Home / Self Care | Payer: Medicare Other | Attending: Emergency Medicine | Admitting: Emergency Medicine

## 2015-04-08 ENCOUNTER — Emergency Department (HOSPITAL_COMMUNITY): Payer: Medicare Other

## 2015-04-08 DIAGNOSIS — Z79899 Other long term (current) drug therapy: Secondary | ICD-10-CM | POA: Insufficient documentation

## 2015-04-08 DIAGNOSIS — I2510 Atherosclerotic heart disease of native coronary artery without angina pectoris: Secondary | ICD-10-CM | POA: Diagnosis not present

## 2015-04-08 DIAGNOSIS — I10 Essential (primary) hypertension: Secondary | ICD-10-CM | POA: Insufficient documentation

## 2015-04-08 DIAGNOSIS — Z791 Long term (current) use of non-steroidal anti-inflammatories (NSAID): Secondary | ICD-10-CM | POA: Insufficient documentation

## 2015-04-08 DIAGNOSIS — I5020 Unspecified systolic (congestive) heart failure: Secondary | ICD-10-CM | POA: Diagnosis not present

## 2015-04-08 DIAGNOSIS — Z7982 Long term (current) use of aspirin: Secondary | ICD-10-CM | POA: Diagnosis not present

## 2015-04-08 DIAGNOSIS — G4733 Obstructive sleep apnea (adult) (pediatric): Secondary | ICD-10-CM | POA: Insufficient documentation

## 2015-04-08 DIAGNOSIS — K219 Gastro-esophageal reflux disease without esophagitis: Secondary | ICD-10-CM | POA: Diagnosis not present

## 2015-04-08 DIAGNOSIS — Z9981 Dependence on supplemental oxygen: Secondary | ICD-10-CM | POA: Insufficient documentation

## 2015-04-08 DIAGNOSIS — R079 Chest pain, unspecified: Secondary | ICD-10-CM | POA: Insufficient documentation

## 2015-04-08 DIAGNOSIS — Z880 Allergy status to penicillin: Secondary | ICD-10-CM | POA: Insufficient documentation

## 2015-04-08 LAB — CBC WITH DIFFERENTIAL/PLATELET
BASOS PCT: 0 %
Basophils Absolute: 0 10*3/uL (ref 0.0–0.1)
EOS PCT: 3 %
Eosinophils Absolute: 0.2 10*3/uL (ref 0.0–0.7)
HCT: 35.3 % — ABNORMAL LOW (ref 36.0–46.0)
HEMOGLOBIN: 11.9 g/dL — AB (ref 12.0–15.0)
LYMPHS ABS: 2.9 10*3/uL (ref 0.7–4.0)
Lymphocytes Relative: 43 %
MCH: 31.2 pg (ref 26.0–34.0)
MCHC: 33.7 g/dL (ref 30.0–36.0)
MCV: 92.7 fL (ref 78.0–100.0)
Monocytes Absolute: 0.6 10*3/uL (ref 0.1–1.0)
Monocytes Relative: 8 %
NEUTROS PCT: 46 %
Neutro Abs: 3 10*3/uL (ref 1.7–7.7)
PLATELETS: 193 10*3/uL (ref 150–400)
RBC: 3.81 MIL/uL — AB (ref 3.87–5.11)
RDW: 13.8 % (ref 11.5–15.5)
WBC: 6.6 10*3/uL (ref 4.0–10.5)

## 2015-04-08 LAB — BASIC METABOLIC PANEL
ANION GAP: 10 (ref 5–15)
BUN: 23 mg/dL — ABNORMAL HIGH (ref 6–20)
CALCIUM: 10.5 mg/dL — AB (ref 8.9–10.3)
CO2: 26 mmol/L (ref 22–32)
Chloride: 106 mmol/L (ref 101–111)
Creatinine, Ser: 1.18 mg/dL — ABNORMAL HIGH (ref 0.44–1.00)
GFR, EST AFRICAN AMERICAN: 47 mL/min — AB (ref >60)
GFR, EST NON AFRICAN AMERICAN: 40 mL/min — AB (ref >60)
GLUCOSE: 115 mg/dL — AB (ref 65–99)
POTASSIUM: 4.3 mmol/L (ref 3.5–5.1)
SODIUM: 142 mmol/L (ref 135–145)

## 2015-04-08 LAB — I-STAT TROPONIN, ED
TROPONIN I, POC: 0.02 ng/mL (ref 0.00–0.08)
TROPONIN I, POC: 0.01 ng/mL (ref 0.00–0.08)

## 2015-04-08 MED ORDER — LORATADINE 10 MG PO TABS
10.0000 mg | ORAL_TABLET | Freq: Every day | ORAL | Status: DC
  Administered 2015-04-08: 10 mg via ORAL
  Filled 2015-04-08: qty 1

## 2015-04-08 NOTE — ED Notes (Signed)
PT ambulates without difficulty with her walker.

## 2015-04-08 NOTE — ED Notes (Signed)
Per [facility] [place]. Pt ate banana and raisin bran around 11pm last night. Pt woke up with left sided cp (intermittent) a few hours later. Pain comes and goes all day. Pt also reports hiccups x 2 days. Pain came back with EMS when pt laughed. Dull pain lasting 5 seconds. Pt took 2-81mg  aspirins herself prior to EMS arrival. Hx CHF.  Pt took home meds prior to transport- levothyroxin, lasix, atorvastatin, carvedilol, omeprazole, spironolactone, modafinil, d3, losartan.

## 2015-04-08 NOTE — ED Provider Notes (Signed)
CSN: [id number]     Arrival date & time 04/08/15  1333 History   First MD Initiated Contact with Patient 04/08/15 1401     No chief complaint on file.    (Consider location/radiation/quality/duration/timing/severity/associated sxs/prior Treatment) HPI  Bailey Duke is a 82 y.o. female with PMH significant for HTN, CAD, HLD, CHF, GERD who presents with sudden onset, intermittent, moderate, sharp, non-radiating left chest pain since approximately 4 AM this morning.  Patient reports the pain lasts about 2 seconds and she has had about 40 episodes today.  No aggravating factors.  It does not appear to be exertional or made worse with deep inspiration.  She repots "silent MI" 3 years ago. She had 324 mg ASA en route.  She denies any chest pain currently.  Associated symptoms include mild dizziness when ambulating.  Denies current CP, SOB, diaphoresis, N/V, abdominal pain, or urinary symptoms.  Past Medical History  Diagnosis Date  . Hypertension   . CAD (coronary artery disease)   . Systolic CHF (HCC)   . Essential hypertension   . GERD (gastroesophageal reflux disease)   . Hyperlipidemia   . Spinal stenosis   . Osteoarthritis   . Sleep apnea     on CPAP qhs  . Hypothyroid   . Benign paroxysmal vertigo   . Proliferative retinopathy of left eye     Non-Diabetic  . B12 deficiency anemia   . Vitamin D deficiency   . OAB (overactive bladder)   . Hypersomnia    Past Surgical History  Procedure Laterality Date  . Joint replacement     Family History  Problem Relation Age of Onset  . Breast cancer Mother   . Other Father     Hardening of the arteries to the head   Social History  Substance Use Topics  . Smoking status: Never Smoker   . Smokeless tobacco: None  . Alcohol Use: 1.2 oz/week    2 Shots of liquor per week   OB History    No data available     Review of Systems All other systems negative unless otherwise stated in HPI    Allergies  Tetanus toxoids;  Penicillins; Procaine; and Sulfa antibiotics  Home Medications   Prior to Admission medications   Medication Sig Start Date End Date Taking? Authorizing Provider  acetaminophen (TYLENOL) 325 MG tablet Take 650 mg by mouth every 4 (four) hours as needed for mild pain or fever.   Yes Historical Provider, MD  aspirin EC 81 MG tablet Take 81 mg by mouth daily. 03/06/14  Yes Historical Provider, MD  atorvastatin (LIPITOR) 20 MG tablet Take 20 mg by mouth daily. 08/19/14 08/19/15 Yes Historical Provider, MD  camphor-menthol Wynelle Fanny(SARNA) lotion Apply 1 application topically 2 (two) times daily as needed for itching.   Yes Historical Provider, MD  carvedilol (COREG) 6.25 MG tablet Take 6.25 mg by mouth 2 (two) times daily with a meal.   Yes Historical Provider, MD  cetirizine (ZYRTEC) 10 MG tablet Take 10 mg by mouth daily as needed for allergies or rhinitis.   Yes Historical Provider, MD  Cholecalciferol (VITAMIN D) 2000 UNITS CAPS Take 2,000 Units by mouth every morning.   Yes Historical Provider, MD  diclofenac sodium (VOLTAREN) 1 % GEL Apply 4 g topically every 6 (six) hours as needed (for lumbosacral spondylosis).   Yes Historical Provider, MD  fluticasone (FLONASE) 50 MCG/ACT nasal spray Place 1 spray into both nostrils daily. 03/05/14 04/08/15 Yes Historical Provider, MD  furosemide (LASIX)  20 MG tablet Take 40 mg by mouth daily. 08/02/14 08/02/15 Yes Historical Provider, MD  gabapentin (NEURONTIN) 300 MG capsule Take 1 capsule in the morning and 2 capsules in the evening. 03/18/14 03/18/16 Yes Historical Provider, MD  levothyroxine (SYNTHROID, LEVOTHROID) 100 MCG tablet Take 100 mcg by mouth daily before breakfast.   Yes Historical Provider, MD  Liniments (PENETRAN PLUS EX) Apply 1 application topically 3 (three) times daily as needed (for joint pain).   Yes Historical Provider, MD  losartan (COZAAR) 100 MG tablet Take 100 mg by mouth every morning.   Yes Historical Provider, MD  magnesium oxide (MAG-OX) 400 MG  tablet Take 400 mg by mouth at bedtime.   Yes Historical Provider, MD  meclizine (ANTIVERT) 25 MG tablet Take 25 mg by mouth 3 (three) times daily as needed for dizziness.   Yes Historical Provider, MD  Menthol, Topical Analgesic, (BIOFREEZE) 4 % GEL Apply 1 application topically 3 (three) times daily as needed (for joint pain).   Yes Historical Provider, MD  modafinil (PROVIGIL) 200 MG tablet Take 200 mg by mouth daily. 08/26/14  Yes Historical Provider, MD  Multiple Vitamins-Minerals (MULTIVITAMIN PO) Take 1 tablet by mouth every morning.   Yes Historical Provider, MD  omeprazole (PRILOSEC) 20 MG capsule Take 20 mg by mouth daily.   Yes Historical Provider, MD  oxybutynin (DITROPAN-XL) 5 MG 24 hr tablet Take 5 mg by mouth at bedtime.   Yes Historical Provider, MD  RA KRILL OIL 500 MG CAPS Take 1 capsule by mouth daily. Reported on 04/08/2015 04/01/14  Yes Historical Provider, MD  senna (SENOKOT) 8.6 MG TABS tablet Take 2 tablets by mouth daily as needed for mild constipation.   Yes Historical Provider, MD  spironolactone (ALDACTONE) 25 MG tablet Take 25 mg by mouth daily. 03/06/14  Yes Historical Provider, MD  traMADol (ULTRAM) 50 MG tablet Take 50 mg by mouth every 6 (six) hours as needed for moderate pain.   Yes Historical Provider, MD  Turmeric (RA TURMERIC) 500 MG CAPS Take 500 mg by mouth daily. Reported on 04/08/2015   Yes Historical Provider, MD  vitamin C (ASCORBIC ACID) 500 MG tablet Take 1,000 mg by mouth every morning. Reported on 04/08/2015   Yes Historical Provider, MD  Armodafinil (NUVIGIL) 250 MG tablet Take 1 tablet (250 mg total) by mouth every morning. Patient not taking: Reported on 04/08/2015 01/27/13   Meredeth Ide, MD   BP 133/65 mmHg  Pulse 62  Resp 19  Ht  (1.626 m)  Wt 82.918 kg  BMI 31.36 kg/m2  SpO2 94% Physical Exam  Constitutional: She is oriented to person, place, and time. She appears well-developed and well-nourished.  Non-toxic appearance. She does not have a  sickly appearance. She does not appear ill.  HENT:  Head: Normocephalic and atraumatic.  Mouth/Throat: Oropharynx is clear and moist.  Eyes: Conjunctivae are normal.  Neck: Normal range of motion. Neck supple.  Cardiovascular: Normal rate, regular rhythm and normal heart sounds.   No murmur heard. No lower extremity edema.   Pulmonary/Chest: Effort normal and breath sounds normal. No accessory muscle usage or stridor. No respiratory distress. She has no wheezes. She has no rhonchi. She has no rales. She exhibits tenderness.    Abdominal: Soft. Bowel sounds are normal. She exhibits no distension. There is no tenderness.  Musculoskeletal: Normal range of motion.  Lymphadenopathy:    She has no cervical adenopathy.  Neurological: She is alert and oriented to person, place, and  time.  Speech clear without dysarthria.  Skin: Skin is warm and dry.  Psychiatric: She has a normal mood and affect. Her behavior is normal.    ED Course  Procedures (including critical care time) Labs Review Labs Reviewed  BASIC METABOLIC PANEL - Abnormal; Notable for the following:    Glucose, Bld 115 (*)    BUN 23 (*)    Creatinine, Ser 1.18 (*)    Calcium 10.5 (*)    GFR calc non Af Amer 40 (*)    GFR calc Af Amer 47 (*)    All other components within normal limits  CBC WITH DIFFERENTIAL/PLATELET - Abnormal; Notable for the following:    RBC 3.81 (*)    Hemoglobin 11.9 (*)    HCT 35.3 (*)    All other components within normal limits  I-STAT TROPOININ, ED  Rosezena Sensor, ED    Imaging Review Dg Chest 2 View  04/08/2015  CLINICAL DATA:  Intermittent chest pain today. Confusion. History of CHF, coronary artery disease and hypertension. EXAM: CHEST  2 VIEW COMPARISON:  09/15/2014. FINDINGS: Stable cardiomegaly and aortic atherosclerosis. There is stable chronic atelectasis or scarring in both lung bases. No edema, confluent airspace opacity or pleural effusion. Asymmetric glenohumeral degenerative  changes are present on the right. IMPRESSION: Stable cardiomegaly and basilar atelectasis/ scarring. No acute findings. Electronically Signed   By: Carey Bullocks M.D.   On: 04/08/2015 15:08   I have personally reviewed and evaluated these images and lab results as part of my medical decision-making.   EKG Interpretation   Date/Time:  Tuesday April 08 2015 13:57:55 EDT Ventricular Rate:  61 PR Interval:  168 QRS Duration: 89 QT Interval:  429 QTC Calculation: 432 R Axis:   -27 Text Interpretation:  Sinus rhythm Inferior infarct, old Probable  anterolateral infarct, age indeterm Confirmed by Ethelda Chick  MD, SAM  [phone]) on 04/08/2015 2:05:04 PM      MDM   Final diagnoses:  Atypical chest pain    Patient presents with non-radiating, sharp, intermittent, 2-3 second chest pain since 4 AM.  VSS, NAD.  No SOB, diaphoresis, N/V.  On exam, heart RRR, lungs CTAB, abdomen soft and benign.  Chest tenderness in pinpoint in left chest.  Labs without acute abnormalities.  Troponin 0.02, EKG without acute abnormalities, CXR shows stable cardiomegaly and basilar atelectasis.  HEART score 3.  Doubt cardiac etiology given history.  Not pleuritic in nature, low suspicion for PE.  Patient has been chest pain free throughout her ED stay.  Plan to obtain delta troponin and anticipate discharge home. Case has been discussed with and seen by Dr. Ethelda Chick who agrees with the above plan  Delta troponin 0.01.  Patient able to ambulate without difficulty.  Plan to discharge home with PCP follow up.  Discussed return precautions.  Patient agrees and acknowledges the above plan for discharge.         Cheri Fowler, PA-C 04/08/15 2026  Doug Sou, MD 04/09/15 [phone]

## 2015-04-08 NOTE — ED Provider Notes (Signed)
Complains of left-sided chest pain left-sided parasternal nonradiating sharp lasting a split second at a time after eating a banana last night. She treated herself with aspirin . Symptoms have improved steadily with time and pain occurs less frequently since last night. Strongly doubt acute coronary syndrome  Doug SouSam Audreana Hancox, MD 04/08/15 1706

## 2015-04-08 NOTE — Discharge Instructions (Signed)
Nonspecific Chest Pain  °Chest pain can be caused by many different conditions. There is always a chance that your pain could be related to something serious, such as a heart attack or a blood clot in your lungs. Chest pain can also be caused by conditions that are not life-threatening. If you have chest pain, it is very important to follow up with your health care provider. °CAUSES  °Chest pain can be caused by: °· Heartburn. °· Pneumonia or bronchitis. °· Anxiety or stress. °· Inflammation around your heart (pericarditis) or lung (pleuritis or pleurisy). °· A blood clot in your lung. °· A collapsed lung (pneumothorax). It can develop suddenly on its own (spontaneous pneumothorax) or from trauma to the chest. °· Shingles infection (varicella-zoster virus). °· Heart attack. °· Damage to the bones, muscles, and cartilage that make up your chest wall. This can include: °¨ Bruised bones due to injury. °¨ Strained muscles or cartilage due to frequent or repeated coughing or overwork. °¨ Fracture to one or more ribs. °¨ Sore cartilage due to inflammation (costochondritis). °RISK FACTORS  °Risk factors for chest pain may include: °· Activities that increase your risk for trauma or injury to your chest. °· Respiratory infections or conditions that cause frequent coughing. °· Medical conditions or overeating that can cause heartburn. °· Heart disease or family history of heart disease. °· Conditions or health behaviors that increase your risk of developing a blood clot. °· Having had chicken pox (varicella zoster). °SIGNS AND SYMPTOMS °Chest pain can feel like: °· Burning or tingling on the surface of your chest or deep in your chest. °· Crushing, pressure, aching, or squeezing pain. °· Dull or sharp pain that is worse when you move, cough, or take a deep breath. °· Pain that is also felt in your back, neck, shoulder, or arm, or pain that spreads to any of these areas. °Your chest pain may come and go, or it may stay  constant. °DIAGNOSIS °Lab tests or other studies may be needed to find the cause of your pain. Your health care provider may have you take a test called an ambulatory ECG (electrocardiogram). An ECG records your heartbeat patterns at the time the test is performed. You may also have other tests, such as: °· Transthoracic echocardiogram (TTE). During echocardiography, sound waves are used to create a picture of all of the heart structures and to look at how blood flows through your heart. °· Transesophageal echocardiogram (TEE). This is a more advanced imaging test that obtains images from inside your body. It allows your health care provider to see your heart in finer detail. °· Cardiac monitoring. This allows your health care provider to monitor your heart rate and rhythm in real time. °· Holter monitor. This is a portable device that records your heartbeat and can help to diagnose abnormal heartbeats. It allows your health care provider to track your heart activity for several days, if needed. °· Stress tests. These can be done through exercise or by taking medicine that makes your heart beat more quickly. °· Blood tests. °· Imaging tests. °TREATMENT  °Your treatment depends on what is causing your chest pain. Treatment may include: °· Medicines. These may include: °¨ Acid blockers for heartburn. °¨ Anti-inflammatory medicine. °¨ Pain medicine for inflammatory conditions. °¨ Antibiotic medicine, if an infection is present. °¨ Medicines to dissolve blood clots. °¨ Medicines to treat coronary artery disease. °· Supportive care for conditions that do not require medicines. This may include: °¨ Resting. °¨ Applying heat   or cold packs to injured areas. °¨ Limiting activities until pain decreases. °HOME CARE INSTRUCTIONS °· If you were prescribed an antibiotic medicine, finish it all even if you start to feel better. °· Avoid any activities that bring on chest pain. °· Do not use any tobacco products, including  cigarettes, chewing tobacco, or electronic cigarettes. If you need help quitting, ask your health care provider. °· Do not drink alcohol. °· Take medicines only as directed by your health care provider. °· Keep all follow-up visits as directed by your health care provider. This is important. This includes any further testing if your chest pain does not go away. °· If heartburn is the cause for your chest pain, you may be told to keep your head raised (elevated) while sleeping. This reduces the chance that acid will go from your stomach into your esophagus. °· Make lifestyle changes as directed by your health care provider. These may include: °¨ Getting regular exercise. Ask your health care provider to suggest some activities that are safe for you. °¨ Eating a heart-healthy diet. A registered dietitian can help you to learn healthy eating options. °¨ Maintaining a healthy weight. °¨ Managing diabetes, if necessary. °¨ Reducing stress. °SEEK MEDICAL CARE IF: °· Your chest pain does not go away after treatment. °· You have a rash with blisters on your chest. °· You have a fever. °SEEK IMMEDIATE MEDICAL CARE IF:  °· Your chest pain is worse. °· You have an increasing cough, or you cough up blood. °· You have severe abdominal pain. °· You have severe weakness. °· You faint. °· You have chills. °· You have sudden, unexplained chest discomfort. °· You have sudden, unexplained discomfort in your arms, back, neck, or jaw. °· You have shortness of breath at any time. °· You suddenly start to sweat, or your skin gets clammy. °· You feel nauseous or you vomit. °· You suddenly feel light-headed or dizzy. °· Your heart begins to beat quickly, or it feels like it is skipping beats. °These symptoms may represent a serious problem that is an emergency. Do not wait to see if the symptoms will go away. Get medical help right away. Call your local emergency services (911 in the [place] Do not drive yourself to the hospital. °  °This  information is not intended to replace advice given to you by your health care provider. Make sure you discuss any questions you have with your health care provider. °  °Document Released: 10/21/2004 Document Revised: 02/01/2014 Document Reviewed: 08/17/2013 °[employer] Interactive Patient Education ©2016 [employer] ° °

## 2015-04-08 NOTE — ED Notes (Signed)
Patient transported to X-ray 

## 2015-05-16 DIAGNOSIS — E213 Hyperparathyroidism, unspecified: Secondary | ICD-10-CM | POA: Insufficient documentation

## 2015-07-10 ENCOUNTER — Emergency Department (HOSPITAL_BASED_OUTPATIENT_CLINIC_OR_DEPARTMENT_OTHER): Payer: Medicare Other

## 2015-07-10 ENCOUNTER — Emergency Department (HOSPITAL_BASED_OUTPATIENT_CLINIC_OR_DEPARTMENT_OTHER)
Admission: EM | Admit: 2015-07-10 | Discharge: 2015-07-10 | Disposition: A | Discharge status: Home / Self Care | Payer: Medicare Other | Attending: Emergency Medicine | Admitting: Emergency Medicine

## 2015-07-10 ENCOUNTER — Encounter (HOSPITAL_BASED_OUTPATIENT_CLINIC_OR_DEPARTMENT_OTHER): Payer: Self-pay | Admitting: *Deleted

## 2015-07-10 DIAGNOSIS — I5020 Unspecified systolic (congestive) heart failure: Secondary | ICD-10-CM | POA: Diagnosis not present

## 2015-07-10 DIAGNOSIS — Z7982 Long term (current) use of aspirin: Secondary | ICD-10-CM | POA: Diagnosis not present

## 2015-07-10 DIAGNOSIS — W19XXXA Unspecified fall, initial encounter: Secondary | ICD-10-CM

## 2015-07-10 DIAGNOSIS — X501XXA Overexertion from prolonged static or awkward postures, initial encounter: Secondary | ICD-10-CM | POA: Diagnosis not present

## 2015-07-10 DIAGNOSIS — Y998 Other external cause status: Secondary | ICD-10-CM | POA: Diagnosis not present

## 2015-07-10 DIAGNOSIS — E785 Hyperlipidemia, unspecified: Secondary | ICD-10-CM | POA: Insufficient documentation

## 2015-07-10 DIAGNOSIS — S93402A Sprain of unspecified ligament of left ankle, initial encounter: Secondary | ICD-10-CM | POA: Diagnosis not present

## 2015-07-10 DIAGNOSIS — E039 Hypothyroidism, unspecified: Secondary | ICD-10-CM | POA: Insufficient documentation

## 2015-07-10 DIAGNOSIS — I2510 Atherosclerotic heart disease of native coronary artery without angina pectoris: Secondary | ICD-10-CM | POA: Insufficient documentation

## 2015-07-10 DIAGNOSIS — Y9342 Activity, yoga: Secondary | ICD-10-CM | POA: Diagnosis not present

## 2015-07-10 DIAGNOSIS — M1990 Unspecified osteoarthritis, unspecified site: Secondary | ICD-10-CM | POA: Diagnosis not present

## 2015-07-10 DIAGNOSIS — Y929 Unspecified place or not applicable: Secondary | ICD-10-CM | POA: Insufficient documentation

## 2015-07-10 DIAGNOSIS — I110 Hypertensive heart disease with heart failure: Secondary | ICD-10-CM | POA: Diagnosis not present

## 2015-07-10 DIAGNOSIS — S0990XA Unspecified injury of head, initial encounter: Secondary | ICD-10-CM | POA: Diagnosis present

## 2015-07-10 DIAGNOSIS — Z79899 Other long term (current) drug therapy: Secondary | ICD-10-CM | POA: Insufficient documentation

## 2015-07-10 MED ORDER — ACETAMINOPHEN 325 MG PO TABS
650.0000 mg | ORAL_TABLET | Freq: Once | ORAL | Status: AC
Start: 2015-07-10 — End: 2015-07-10
  Administered 2015-07-10: 650 mg via ORAL
  Filled 2015-07-10: qty 2

## 2015-07-10 NOTE — ED Provider Notes (Signed)
CSN: [id number]     Arrival date & time 07/10/15  1455 History   First MD Initiated Contact with Patient 07/10/15 1522     Chief Complaint  Patient presents with  . Fall     (Consider location/radiation/quality/duration/timing/severity/associated sxs/prior Treatment) HPI  82 year old female presents after falling during yoga class. She was doing a lunge exercise and her left knee gave out on her. She states she has chronic left knee/patella issues and this is not abnormal for her. Her ankle twisted when she was falling. She fell and hit the back of her head as well. Denies any current headache or swelling. Is on a baby aspirin but no other blood thinners. Has moderate pain to her left ankle with swelling. Denies any current knee pain or swelling. No other injuries. No weakness or numbness. Did not feel dizzy or have chest pain or shortness of breath during this episode.  Past Medical History  Diagnosis Date  . Hypertension   . CAD (coronary artery disease)   . Systolic CHF (HCC)   . Essential hypertension   . GERD (gastroesophageal reflux disease)   . Hyperlipidemia   . Spinal stenosis   . Osteoarthritis   . Sleep apnea     on CPAP qhs  . Hypothyroid   . Benign paroxysmal vertigo   . Proliferative retinopathy of left eye     Non-Diabetic  . B12 deficiency anemia   . Vitamin D deficiency   . OAB (overactive bladder)   . Hypersomnia    Past Surgical History  Procedure Laterality Date  . Joint replacement     Family History  Problem Relation Age of Onset  . Breast cancer Mother   . Other Father     Hardening of the arteries to the head   Social History  Substance Use Topics  . Smoking status: Never Smoker   . Smokeless tobacco: None  . Alcohol Use: 1.2 oz/week    2 Shots of liquor per week   OB History    No data available     Review of Systems  Cardiovascular: Negative for chest pain.  Musculoskeletal: Positive for joint swelling and arthralgias.  Neurological:  Negative for weakness, numbness and headaches.  All other systems reviewed and are negative.     Allergies  Tetanus toxoids; Penicillins; Procaine; and Sulfa antibiotics  Home Medications   Prior to Admission medications   Medication Sig Start Date End Date Taking? Authorizing Provider  acetaminophen (TYLENOL) 325 MG tablet Take 650 mg by mouth every 4 (four) hours as needed for mild pain or fever.    Historical Provider, MD  Armodafinil (NUVIGIL) 250 MG tablet Take 1 tablet (250 mg total) by mouth every morning. Patient not taking: Reported on 04/08/2015 01/27/13   Meredeth Ide, MD  aspirin EC 81 MG tablet Take 81 mg by mouth daily. 03/06/14   Historical Provider, MD  atorvastatin (LIPITOR) 20 MG tablet Take 20 mg by mouth daily. 08/19/14 08/19/15  Historical Provider, MD  camphor-menthol Wynelle Fanny) lotion Apply 1 application topically 2 (two) times daily as needed for itching.    Historical Provider, MD  carvedilol (COREG) 6.25 MG tablet Take 6.25 mg by mouth 2 (two) times daily with a meal.    Historical Provider, MD  cetirizine (ZYRTEC) 10 MG tablet Take 10 mg by mouth daily as needed for allergies or rhinitis.    Historical Provider, MD  Cholecalciferol (VITAMIN D) 2000 UNITS CAPS Take 2,000 Units by mouth every morning.  Historical Provider, MD  diclofenac sodium (VOLTAREN) 1 % GEL Apply 4 g topically every 6 (six) hours as needed (for lumbosacral spondylosis).    Historical Provider, MD  fluticasone (FLONASE) 50 MCG/ACT nasal spray Place 1 spray into both nostrils daily. 03/05/14 04/08/15  Historical Provider, MD  furosemide (LASIX) 20 MG tablet Take 40 mg by mouth daily. 08/02/14 08/02/15  Historical Provider, MD  gabapentin (NEURONTIN) 300 MG capsule Take 1 capsule in the morning and 2 capsules in the evening. 03/18/14 03/18/16  Historical Provider, MD  levothyroxine (SYNTHROID, LEVOTHROID) 100 MCG tablet Take 100 mcg by mouth daily before breakfast.    Historical Provider, MD  Liniments  (PENETRAN PLUS EX) Apply 1 application topically 3 (three) times daily as needed (for joint pain).    Historical Provider, MD  losartan (COZAAR) 100 MG tablet Take 100 mg by mouth every morning.    Historical Provider, MD  magnesium oxide (MAG-OX) 400 MG tablet Take 400 mg by mouth at bedtime.    Historical Provider, MD  meclizine (ANTIVERT) 25 MG tablet Take 25 mg by mouth 3 (three) times daily as needed for dizziness.    Historical Provider, MD  Menthol, Topical Analgesic, (BIOFREEZE) 4 % GEL Apply 1 application topically 3 (three) times daily as needed (for joint pain).    Historical Provider, MD  modafinil (PROVIGIL) 200 MG tablet Take 200 mg by mouth daily. 08/26/14   Historical Provider, MD  Multiple Vitamins-Minerals (MULTIVITAMIN PO) Take 1 tablet by mouth every morning.    Historical Provider, MD  omeprazole (PRILOSEC) 20 MG capsule Take 20 mg by mouth daily.    Historical Provider, MD  oxybutynin (DITROPAN-XL) 5 MG 24 hr tablet Take 5 mg by mouth at bedtime.    Historical Provider, MD  RA KRILL OIL 500 MG CAPS Take 1 capsule by mouth daily. Reported on 04/08/2015 04/01/14   Historical Provider, MD  senna (SENOKOT) 8.6 MG TABS tablet Take 2 tablets by mouth daily as needed for mild constipation.    Historical Provider, MD  spironolactone (ALDACTONE) 25 MG tablet Take 25 mg by mouth daily. 03/06/14   Historical Provider, MD  traMADol (ULTRAM) 50 MG tablet Take 50 mg by mouth every 6 (six) hours as needed for moderate pain.    Historical Provider, MD  Turmeric (RA TURMERIC) 500 MG CAPS Take 500 mg by mouth daily. Reported on 04/08/2015    Historical Provider, MD  vitamin C (ASCORBIC ACID) 500 MG tablet Take 1,000 mg by mouth every morning. Reported on 04/08/2015    Historical Provider, MD   BP 135/83 mmHg  Pulse 63  Temp(Src) 98.9 F (37.2 C) (Oral)  Resp 18  Ht 5\' 4"  (1.626 m)  Wt 179 lb (81.194 kg)  BMI 30.71 kg/m2  SpO2 99% Physical Exam  Constitutional: She is oriented to person, place,  and time. She appears well-developed and well-nourished.  HENT:  Head: Normocephalic and atraumatic.  Right Ear: External ear normal.  Left Ear: External ear normal.  Nose: Nose normal.  No scalp tenderness or swelling  Eyes: Right eye exhibits no discharge. Left eye exhibits no discharge.  Cardiovascular: Normal rate, regular rhythm and normal heart sounds.   Pulses:      Dorsalis pedis pulses are 2+ on the right side, and 2+ on the left side.  Pulmonary/Chest: Effort normal and breath sounds normal.  Abdominal: Soft. There is no tenderness.  Musculoskeletal:       Left knee: No tenderness found.  Left ankle: She exhibits swelling and ecchymosis. She exhibits normal range of motion and normal pulse. Tenderness. Achilles tendon normal. Achilles tendon exhibits no pain and no defect.       Left lower leg: She exhibits no tenderness.       Left foot: There is no tenderness.       Feet:  Neurological: She is alert and oriented to person, place, and time.  CN 3-12 grossly intact. 5/5 strength in all 4 extremities. Grossly normal sensation.  Skin: Skin is warm and dry.  Nursing note and vitals reviewed.   ED Course  Procedures (including critical care time) Labs Review Labs Reviewed - No data to display  Imaging Review Dg Ankle Complete Left  07/10/2015  CLINICAL DATA:  Left ankle pain and swelling status post fall today. EXAM: LEFT ANKLE COMPLETE - 3+ VIEW COMPARISON:  None. FINDINGS: Osseous alignment is normal. No fracture line or displaced fracture fragment identified. Ankle mortise is symmetric. Soft tissue swelling noted over the lateral malleolus. Atherosclerotic calcifications also seen within the soft tissues adjacent to the left ankle. IMPRESSION: Soft tissue swelling/edema overlying the lateral malleolus. No osseous fracture or dislocation. Electronically Signed   By: Bary RichardStan  Maynard M.D.   On: 07/10/2015 17:09   Ct Head Wo Contrast  07/10/2015  CLINICAL DATA:  Fall today  hitting head.  Head pain. EXAM: CT HEAD WITHOUT CONTRAST TECHNIQUE: Contiguous axial images were obtained from the base of the skull through the vertex without intravenous contrast. COMPARISON:  Head CT dated 01/19/2015. FINDINGS: Brain: There is mild generalized age-related brain atrophy with commensurate dilatation of the ventricles and sulci. There are chronic small vessel ischemic changes within the bilateral periventricular and subcortical white matter regions. There is no mass, hemorrhage, edema or other evidence of acute parenchymal abnormality. No extra-axial hemorrhage. Vascular: No hyperdense vessel or unexpected calcification. There are chronic calcified atherosclerotic changes of the large vessels at the skull base. Skull: Negative for fracture or focal lesion. Sinuses/Orbits: No acute findings. Other: Superficial soft tissues are unremarkable. No scalp edema/hematoma seen. IMPRESSION: 1. No acute findings. No intracranial hemorrhage or edema. No fracture. 2. Atrophy and chronic ischemic changes in the white matter. Electronically Signed   By: Bary RichardStan  Maynard M.D.   On: 07/10/2015 17:11   I have personally reviewed and evaluated these images and lab results as part of my medical decision-making.   EKG Interpretation None      MDM   Final diagnoses:  Fall, initial encounter  Left ankle sprain, initial encounter    Patient's fall was mechanical after her knee "popped". Knee structurally appears okay now. Left ankle appears to be a sprain with only mild pain and moderate swelling. Will place in an Ace wrap and recommend ice and Tylenol. No significant signs of head injury and head CT is unremarkable. At this point feel patient is stable for discharge home with return precautions.    Pricilla LovelessScott Jolie Strohecker, MD 07/11/15 [phone]

## 2015-07-10 NOTE — ED Notes (Signed)
Pt's in waiting room waiting for her ride to pick her up -- registration and nurse first aware.

## 2015-07-10 NOTE — ED Notes (Signed)
[facility] to [place] [facility], states a driver is on the way to pick up the patient. Patient in wheelchair awaiting her ride. Patient updated that contact was made with [facility] staff and location of patient was clarified.

## 2015-07-10 NOTE — ED Notes (Signed)
Patient fell during yoga class & staff concerned because she hit the right side of the back of her head.  She c/o left ankle pain.

## 2015-07-28 ENCOUNTER — Encounter (HOSPITAL_BASED_OUTPATIENT_CLINIC_OR_DEPARTMENT_OTHER): Payer: Self-pay | Admitting: *Deleted

## 2015-07-28 ENCOUNTER — Emergency Department (HOSPITAL_BASED_OUTPATIENT_CLINIC_OR_DEPARTMENT_OTHER): Payer: Medicare Other

## 2015-07-28 ENCOUNTER — Emergency Department (HOSPITAL_BASED_OUTPATIENT_CLINIC_OR_DEPARTMENT_OTHER)
Admission: EM | Admit: 2015-07-28 | Discharge: 2015-07-28 | Disposition: A | Discharge status: Home / Self Care | Payer: Medicare Other | Attending: Emergency Medicine | Admitting: Emergency Medicine

## 2015-07-28 DIAGNOSIS — S93492D Sprain of other ligament of left ankle, subsequent encounter: Secondary | ICD-10-CM | POA: Insufficient documentation

## 2015-07-28 DIAGNOSIS — I110 Hypertensive heart disease with heart failure: Secondary | ICD-10-CM | POA: Insufficient documentation

## 2015-07-28 DIAGNOSIS — I5020 Unspecified systolic (congestive) heart failure: Secondary | ICD-10-CM | POA: Insufficient documentation

## 2015-07-28 DIAGNOSIS — M1990 Unspecified osteoarthritis, unspecified site: Secondary | ICD-10-CM | POA: Diagnosis not present

## 2015-07-28 DIAGNOSIS — Y93B9 Activity, other involving muscle strengthening exercises: Secondary | ICD-10-CM | POA: Insufficient documentation

## 2015-07-28 DIAGNOSIS — Z79899 Other long term (current) drug therapy: Secondary | ICD-10-CM | POA: Insufficient documentation

## 2015-07-28 DIAGNOSIS — Z7982 Long term (current) use of aspirin: Secondary | ICD-10-CM | POA: Insufficient documentation

## 2015-07-28 DIAGNOSIS — Y929 Unspecified place or not applicable: Secondary | ICD-10-CM | POA: Diagnosis not present

## 2015-07-28 DIAGNOSIS — Y999 Unspecified external cause status: Secondary | ICD-10-CM | POA: Diagnosis not present

## 2015-07-28 DIAGNOSIS — W07XXXA Fall from chair, initial encounter: Secondary | ICD-10-CM | POA: Insufficient documentation

## 2015-07-28 DIAGNOSIS — I2510 Atherosclerotic heart disease of native coronary artery without angina pectoris: Secondary | ICD-10-CM | POA: Diagnosis not present

## 2015-07-28 DIAGNOSIS — E039 Hypothyroidism, unspecified: Secondary | ICD-10-CM | POA: Insufficient documentation

## 2015-07-28 DIAGNOSIS — E785 Hyperlipidemia, unspecified: Secondary | ICD-10-CM | POA: Diagnosis not present

## 2015-07-28 DIAGNOSIS — S86002D Unspecified injury of left Achilles tendon, subsequent encounter: Secondary | ICD-10-CM

## 2015-07-28 DIAGNOSIS — S93402D Sprain of unspecified ligament of left ankle, subsequent encounter: Secondary | ICD-10-CM

## 2015-07-28 DIAGNOSIS — S8992XD Unspecified injury of left lower leg, subsequent encounter: Secondary | ICD-10-CM | POA: Diagnosis present

## 2015-07-28 NOTE — ED Notes (Signed)
Pt. Has noted edema in the L foot.

## 2015-07-28 NOTE — ED Notes (Signed)
Signature pad being worked on in room 4

## 2015-07-28 NOTE — ED Notes (Signed)
Patient transported to Ultrasound 

## 2015-07-28 NOTE — ED Provider Notes (Signed)
CSN: [id number]     Arrival date & time 07/28/15  1741 History  By signing my name below, I, Bailey Duke, attest that this documentation has been prepared under the direction and in the presence of Bailey Sprout, MD . Electronically Signed: Levon Duke, Scribe. 07/28/2015. 7:22 PM.     Chief Complaint  Patient presents with  . Leg Pain   The history is provided by the patient. No language interpreter was used.    HPI Comments:  Bailey Duke is a 82 y.o. female who presents to the Emergency Department complaining of sudden onset, moderate, constant pain in left leg onset 2.5 weeks s/p fall in exercise class. Pt states she recently started taking a chair yoga class. Pt lunged forward and states her knee gave out, which she states is not abnormal for her. She reports she twisted her ankle as she fell. Pt was seen in ED the day of injury and had a normal xray. Pt also complains of associated unchanged swelling to ankle and difficulty ambulating.  She saw new orthopedist today and states he was concerned that she might have a blood clot or infection. She denies any chest pain or shortness of breath.  Past Medical History  Diagnosis Date  . Hypertension   . CAD (coronary artery disease)   . Systolic CHF (HCC)   . Essential hypertension   . GERD (gastroesophageal reflux disease)   . Hyperlipidemia   . Spinal stenosis   . Osteoarthritis   . Sleep apnea     on CPAP qhs  . Hypothyroid   . Benign paroxysmal vertigo   . Proliferative retinopathy of left eye     Non-Diabetic  . B12 deficiency anemia   . Vitamin D deficiency   . OAB (overactive bladder)   . Hypersomnia    Past Surgical History  Procedure Laterality Date  . Joint replacement     Family History  Problem Relation Age of Onset  . Breast cancer Mother   . Other Father     Hardening of the arteries to the head   Social History  Substance Use Topics  . Smoking status: Never Smoker   . Smokeless tobacco: None  .  Alcohol Use: 1.2 oz/week    2 Shots of liquor per week   OB History    No data available     Review of Systems  Respiratory: Negative for shortness of breath.   Cardiovascular: Negative for chest pain.  Musculoskeletal: Positive for myalgias, joint swelling and arthralgias.  All other systems reviewed and are negative.  Allergies  Tetanus toxoids; Penicillins; Procaine; and Sulfa antibiotics  Home Medications   Prior to Admission medications   Medication Sig Start Date End Date Taking? Authorizing Provider  acetaminophen (TYLENOL) 325 MG tablet Take 650 mg by mouth every 4 (four) hours as needed for mild pain or fever.    Historical Provider, MD  Armodafinil (NUVIGIL) 250 MG tablet Take 1 tablet (250 mg total) by mouth every morning. Patient not taking: Reported on 04/08/2015 01/27/13   Meredeth Ide, MD  aspirin EC 81 MG tablet Take 81 mg by mouth daily. 03/06/14   Historical Provider, MD  atorvastatin (LIPITOR) 20 MG tablet Take 20 mg by mouth daily. 08/19/14 08/19/15  Historical Provider, MD  camphor-menthol Wynelle Fanny) lotion Apply 1 application topically 2 (two) times daily as needed for itching.    Historical Provider, MD  carvedilol (COREG) 6.25 MG tablet Take 6.25 mg by mouth 2 (two) times daily  with a meal.    Historical Provider, MD  cetirizine (ZYRTEC) 10 MG tablet Take 10 mg by mouth daily as needed for allergies or rhinitis.    Historical Provider, MD  Cholecalciferol (VITAMIN D) 2000 UNITS CAPS Take 2,000 Units by mouth every morning.    Historical Provider, MD  diclofenac sodium (VOLTAREN) 1 % GEL Apply 4 g topically every 6 (six) hours as needed (for lumbosacral spondylosis).    Historical Provider, MD  fluticasone (FLONASE) 50 MCG/ACT nasal spray Place 1 spray into both nostrils daily. 03/05/14 04/08/15  Historical Provider, MD  furosemide (LASIX) 20 MG tablet Take 40 mg by mouth daily. 08/02/14 08/02/15  Historical Provider, MD  gabapentin (NEURONTIN) 300 MG capsule Take 1 capsule in  the morning and 2 capsules in the evening. 03/18/14 03/18/16  Historical Provider, MD  levothyroxine (SYNTHROID, LEVOTHROID) 100 MCG tablet Take 100 mcg by mouth daily before breakfast.    Historical Provider, MD  Liniments (PENETRAN PLUS EX) Apply 1 application topically 3 (three) times daily as needed (for joint pain).    Historical Provider, MD  losartan (COZAAR) 100 MG tablet Take 100 mg by mouth every morning.    Historical Provider, MD  magnesium oxide (MAG-OX) 400 MG tablet Take 400 mg by mouth at bedtime.    Historical Provider, MD  meclizine (ANTIVERT) 25 MG tablet Take 25 mg by mouth 3 (three) times daily as needed for dizziness.    Historical Provider, MD  Menthol, Topical Analgesic, (BIOFREEZE) 4 % GEL Apply 1 application topically 3 (three) times daily as needed (for joint pain).    Historical Provider, MD  modafinil (PROVIGIL) 200 MG tablet Take 200 mg by mouth daily. 08/26/14   Historical Provider, MD  Multiple Vitamins-Minerals (MULTIVITAMIN PO) Take 1 tablet by mouth every morning.    Historical Provider, MD  omeprazole (PRILOSEC) 20 MG capsule Take 20 mg by mouth daily.    Historical Provider, MD  oxybutynin (DITROPAN-XL) 5 MG 24 hr tablet Take 5 mg by mouth at bedtime.    Historical Provider, MD  RA KRILL OIL 500 MG CAPS Take 1 capsule by mouth daily. Reported on 04/08/2015 04/01/14   Historical Provider, MD  senna (SENOKOT) 8.6 MG TABS tablet Take 2 tablets by mouth daily as needed for mild constipation.    Historical Provider, MD  spironolactone (ALDACTONE) 25 MG tablet Take 25 mg by mouth daily. 03/06/14   Historical Provider, MD  traMADol (ULTRAM) 50 MG tablet Take 50 mg by mouth every 6 (six) hours as needed for moderate pain.    Historical Provider, MD  Turmeric (RA TURMERIC) 500 MG CAPS Take 500 mg by mouth daily. Reported on 04/08/2015    Historical Provider, MD  vitamin C (ASCORBIC ACID) 500 MG tablet Take 1,000 mg by mouth every morning. Reported on 04/08/2015    Historical  Provider, MD   BP 135/53 mmHg  Pulse 62  Temp(Src) 99.1 F (37.3 C) (Oral)  Resp 18  Ht 5\' 4"  (1.626 m)  Wt 179 lb (81.194 kg)  BMI 30.71 kg/m2  SpO2 98% Physical Exam  Constitutional: She is oriented to person, place, and time. She appears well-developed and well-nourished. No distress.  HENT:  Head: Normocephalic and atraumatic.  Eyes: Conjunctivae are normal.  Cardiovascular: Normal rate.   Pulmonary/Chest: Effort normal.  Abdominal: She exhibits no distension.  Musculoskeletal: She exhibits edema and tenderness.  Left lower extremity has edema present to mid tib fib with ecchymosis and mild erythema present around the left  ankle. Signifiant tenderness with ROM and palpation to ankle. Pain with palpation to achilles tendon. 2+ DP pulse. No knee tenderness. No proximal pack tenderness.   Neurological: She is alert and oriented to person, place, and time.  Skin: Skin is warm and dry. There is erythema.  Psychiatric: She has a normal mood and affect.  Nursing note and vitals reviewed.   ED Course  Procedures  DIAGNOSTIC STUDIES:  Oxygen Saturation is 98% on RA, normal by my interpretation.    COORDINATION OF CARE:  6:22 PM Will order DG ankle, DG tibia/fibula, and US of left lower extremity. Discussed treatment plan with pt at bedside and pt agreed to plan.  Labs Review Labs Reviewed - No data to display  Imaging Review Dg Tibia/fibula Left  07/28/2015  CLINICAL DATA:  Fall while doing yoga today. EXAM: LEFT TIBIA AND FIBULA - 2 VIEW COMPARISON:  01/26/2013 FINDINGS: Left knee arthroplasty components intact and normally located. Mild diffuse osteopenia. No acute fracture or dislocation. Small vessel atherosclerotic disease. Small inferior calcaneal spur. IMPRESSION: No acute findings. Electronically Signed   By: Elberta Fortisaniel  Boyle M.D.   On: 07/28/2015 19:15   Dg Ankle Complete Left  07/28/2015  CLINICAL DATA:  Larey SeatFell doing yoga EXAM: LEFT ANKLE COMPLETE - 3+ VIEW COMPARISON:   07/10/2015 FINDINGS: Mild diffuse soft tissue swelling is identified. No underlying fracture or subluxation identified. Vascular calcifications noted. IMPRESSION: 1. Diffuse soft tissue swelling. Electronically Signed   By: Signa Kellaylor  Stroud M.D.   On: 07/28/2015 19:13   [place] Venous Img Lower Unilateral Left  07/28/2015  CLINICAL DATA:  Left lower extremity swelling and foot and ankle pain, 2-3 weeks duration. EXAM: Left LOWER EXTREMITY VENOUS DOPPLER ULTRASOUND TECHNIQUE: Gray-scale sonography with graded compression, as well as color Doppler and duplex ultrasound were performed to evaluate the lower extremity deep venous systems from the level of the common femoral vein and including the common femoral, femoral, profunda femoral, popliteal and calf veins including the posterior tibial, peroneal and gastrocnemius veins when visible. The superficial great saphenous vein was also interrogated. Spectral Doppler was utilized to evaluate flow at rest and with distal augmentation maneuvers in the common femoral, femoral and popliteal veins. COMPARISON:  Radiography same day FINDINGS: Contralateral Common Femoral Vein: Respiratory phasicity is normal and symmetric with the symptomatic side. No evidence of thrombus. Normal compressibility. Common Femoral Vein: No evidence of thrombus. Normal compressibility, respiratory phasicity and response to augmentation. Saphenofemoral Junction: No evidence of thrombus. Normal compressibility and flow on color Doppler imaging. Profunda Femoral Vein: No evidence of thrombus. Normal compressibility and flow on color Doppler imaging. Femoral Vein: No evidence of thrombus. Normal compressibility, respiratory phasicity and response to augmentation. Popliteal Vein: No evidence of thrombus. Normal compressibility, respiratory phasicity and response to augmentation. Calf Veins: No evidence of thrombus. Normal compressibility and flow on color Doppler imaging. Superficial Great Saphenous Vein:  Previously surgically stripped. Venous Reflux:  None. Other Findings:  None. IMPRESSION: No evidence of deep venous thrombosis. Electronically Signed   By: Paulina FusiMark  Shogry M.D.   On: 07/28/2015 19:10   I have personally reviewed and evaluated these images and lab results as part of my medical decision-making.   EKG Interpretation None      MDM   Final diagnoses:  Left ankle sprain, subsequent encounter  Achilles tendon injury, left, subsequent encounter    Patient is an 82 year old female presenting here for evaluation of ongoing left ankle and lower extremity pain and swelling. This occurred after a fall  while doing chair yoga.  She has had persistent pain in her left ankle and had imaging initially which was negative. She saw her orthopedist today who recommended coming here to rule out DVT. On exam patient has no proximal calf tenderness but still has significant tenderness over the Achilles tendon as well as with range of motion of her ankle. Imaging is negative for healing fracture. Patient was placed in a Cam Walker and encouraged follow-up with her orthopedist on Wednesday as planned  I personally performed the services described in this documentation, which was scribed in my presence.  The recorded information has been reviewed and considered.    Bailey SproutWhitney Jaycen Vercher, MD 07/28/15 [phone]

## 2015-07-28 NOTE — ED Notes (Signed)
Attempted to get a urine sample, but patient missed the hat entirely.

## 2015-07-28 NOTE — ED Notes (Signed)
Pain in her left leg x 2 weeks during exercise class. Swelling and pain continue.

## 2015-09-25 ENCOUNTER — Other Ambulatory Visit (HOSPITAL_COMMUNITY): Payer: Self-pay | Admitting: Orthopedic Surgery

## 2015-09-25 DIAGNOSIS — M79605 Pain in left leg: Secondary | ICD-10-CM

## 2016-05-25 HISTORY — PX: NM MYOVIEW LTD: HXRAD82

## 2016-12-01 ENCOUNTER — Emergency Department (HOSPITAL_BASED_OUTPATIENT_CLINIC_OR_DEPARTMENT_OTHER): Payer: Medicare Other

## 2016-12-01 ENCOUNTER — Encounter (HOSPITAL_BASED_OUTPATIENT_CLINIC_OR_DEPARTMENT_OTHER): Payer: Self-pay | Admitting: Respiratory Therapy

## 2016-12-01 ENCOUNTER — Inpatient Hospital Stay (HOSPITAL_BASED_OUTPATIENT_CLINIC_OR_DEPARTMENT_OTHER)
Admission: EM | Admit: 2016-12-01 | Discharge: 2016-12-04 | DRG: 689 | Disposition: A | Discharge status: Home Health | Payer: Medicare Other | Attending: Internal Medicine | Admitting: Internal Medicine

## 2016-12-01 ENCOUNTER — Other Ambulatory Visit: Payer: Self-pay

## 2016-12-01 DIAGNOSIS — Z9989 Dependence on other enabling machines and devices: Secondary | ICD-10-CM

## 2016-12-01 DIAGNOSIS — K219 Gastro-esophageal reflux disease without esophagitis: Secondary | ICD-10-CM | POA: Diagnosis present

## 2016-12-01 DIAGNOSIS — G8929 Other chronic pain: Secondary | ICD-10-CM | POA: Diagnosis present

## 2016-12-01 DIAGNOSIS — I129 Hypertensive chronic kidney disease with stage 1 through stage 4 chronic kidney disease, or unspecified chronic kidney disease: Secondary | ICD-10-CM | POA: Diagnosis present

## 2016-12-01 DIAGNOSIS — Z887 Allergy status to serum and vaccine status: Secondary | ICD-10-CM

## 2016-12-01 DIAGNOSIS — G9341 Metabolic encephalopathy: Secondary | ICD-10-CM | POA: Diagnosis present

## 2016-12-01 DIAGNOSIS — I2510 Atherosclerotic heart disease of native coronary artery without angina pectoris: Secondary | ICD-10-CM | POA: Diagnosis present

## 2016-12-01 DIAGNOSIS — N182 Chronic kidney disease, stage 2 (mild): Secondary | ICD-10-CM | POA: Diagnosis present

## 2016-12-01 DIAGNOSIS — G9340 Encephalopathy, unspecified: Secondary | ICD-10-CM | POA: Diagnosis not present

## 2016-12-01 DIAGNOSIS — N12 Tubulo-interstitial nephritis, not specified as acute or chronic: Secondary | ICD-10-CM | POA: Diagnosis present

## 2016-12-01 DIAGNOSIS — Z884 Allergy status to anesthetic agent status: Secondary | ICD-10-CM | POA: Diagnosis not present

## 2016-12-01 DIAGNOSIS — F0390 Unspecified dementia without behavioral disturbance: Secondary | ICD-10-CM | POA: Diagnosis present

## 2016-12-01 DIAGNOSIS — E039 Hypothyroidism, unspecified: Secondary | ICD-10-CM | POA: Diagnosis present

## 2016-12-01 DIAGNOSIS — Z880 Allergy status to penicillin: Secondary | ICD-10-CM | POA: Diagnosis not present

## 2016-12-01 DIAGNOSIS — Z882 Allergy status to sulfonamides status: Secondary | ICD-10-CM

## 2016-12-01 DIAGNOSIS — Z66 Do not resuscitate: Secondary | ICD-10-CM | POA: Diagnosis present

## 2016-12-01 DIAGNOSIS — N390 Urinary tract infection, site not specified: Principal | ICD-10-CM | POA: Diagnosis present

## 2016-12-01 DIAGNOSIS — G4733 Obstructive sleep apnea (adult) (pediatric): Secondary | ICD-10-CM | POA: Diagnosis present

## 2016-12-01 DIAGNOSIS — E785 Hyperlipidemia, unspecified: Secondary | ICD-10-CM | POA: Diagnosis present

## 2016-12-01 DIAGNOSIS — I10 Essential (primary) hypertension: Secondary | ICD-10-CM | POA: Diagnosis present

## 2016-12-01 DIAGNOSIS — M542 Cervicalgia: Secondary | ICD-10-CM | POA: Diagnosis present

## 2016-12-01 DIAGNOSIS — B961 Klebsiella pneumoniae [K. pneumoniae] as the cause of diseases classified elsewhere: Secondary | ICD-10-CM | POA: Diagnosis present

## 2016-12-01 LAB — CBC
HEMATOCRIT: 30.9 % — AB (ref 36.0–46.0)
Hemoglobin: 10.3 g/dL — ABNORMAL LOW (ref 12.0–15.0)
MCH: 30.7 pg (ref 26.0–34.0)
MCHC: 33.3 g/dL (ref 30.0–36.0)
MCV: 92.2 fL (ref 78.0–100.0)
Platelets: 289 10*3/uL (ref 150–400)
RBC: 3.35 MIL/uL — AB (ref 3.87–5.11)
RDW: 13.7 % (ref 11.5–15.5)
WBC: 12.4 10*3/uL — AB (ref 4.0–10.5)

## 2016-12-01 LAB — LIPASE, BLOOD: Lipase: 28 U/L (ref 11–51)

## 2016-12-01 LAB — CBC WITH DIFFERENTIAL/PLATELET
Basophils Absolute: 0 10*3/uL (ref 0.0–0.1)
Basophils Relative: 0 %
EOS ABS: 0 10*3/uL (ref 0.0–0.7)
EOS PCT: 0 %
HCT: 30.1 % — ABNORMAL LOW (ref 36.0–46.0)
Hemoglobin: 10.1 g/dL — ABNORMAL LOW (ref 12.0–15.0)
LYMPHS ABS: 1.8 10*3/uL (ref 0.7–4.0)
LYMPHS PCT: 15 %
MCH: 31 pg (ref 26.0–34.0)
MCHC: 33.6 g/dL (ref 30.0–36.0)
MCV: 92.3 fL (ref 78.0–100.0)
MONOS PCT: 8 %
Monocytes Absolute: 1 10*3/uL (ref 0.1–1.0)
Neutro Abs: 9.2 10*3/uL — ABNORMAL HIGH (ref 1.7–7.7)
Neutrophils Relative %: 77 %
PLATELETS: 279 10*3/uL (ref 150–400)
RBC: 3.26 MIL/uL — AB (ref 3.87–5.11)
RDW: 13.2 % (ref 11.5–15.5)
WBC: 12 10*3/uL — ABNORMAL HIGH (ref 4.0–10.5)

## 2016-12-01 LAB — URINALYSIS, ROUTINE W REFLEX MICROSCOPIC
BILIRUBIN URINE: NEGATIVE
Glucose, UA: NEGATIVE mg/dL
Ketones, ur: NEGATIVE mg/dL
Nitrite: POSITIVE — AB
Protein, ur: NEGATIVE mg/dL
SPECIFIC GRAVITY, URINE: 1.02 (ref 1.005–1.030)
pH: 5.5 (ref 5.0–8.0)

## 2016-12-01 LAB — RAPID URINE DRUG SCREEN, HOSP PERFORMED
AMPHETAMINES: NOT DETECTED
BENZODIAZEPINES: NOT DETECTED
Barbiturates: NOT DETECTED
Cocaine: NOT DETECTED
Opiates: NOT DETECTED
Tetrahydrocannabinol: NOT DETECTED

## 2016-12-01 LAB — URINALYSIS, MICROSCOPIC (REFLEX)

## 2016-12-01 LAB — COMPREHENSIVE METABOLIC PANEL
ALK PHOS: 77 U/L (ref 38–126)
ALT: 43 U/L (ref 14–54)
ANION GAP: 10 (ref 5–15)
AST: 56 U/L — ABNORMAL HIGH (ref 15–41)
Albumin: 3.1 g/dL — ABNORMAL LOW (ref 3.5–5.0)
BUN: 39 mg/dL — ABNORMAL HIGH (ref 6–20)
CHLORIDE: 102 mmol/L (ref 101–111)
CO2: 23 mmol/L (ref 22–32)
Calcium: 10.2 mg/dL (ref 8.9–10.3)
Creatinine, Ser: 1.29 mg/dL — ABNORMAL HIGH (ref 0.44–1.00)
GFR calc non Af Amer: 36 mL/min — ABNORMAL LOW (ref >60)
GFR, EST AFRICAN AMERICAN: 41 mL/min — AB (ref >60)
Glucose, Bld: 175 mg/dL — ABNORMAL HIGH (ref 65–99)
Potassium: 3.9 mmol/L (ref 3.5–5.1)
SODIUM: 135 mmol/L (ref 135–145)
Total Bilirubin: 0.3 mg/dL (ref 0.3–1.2)
Total Protein: 6.2 g/dL — ABNORMAL LOW (ref 6.5–8.1)

## 2016-12-01 LAB — CREATININE, SERUM
Creatinine, Ser: 1.27 mg/dL — ABNORMAL HIGH (ref 0.44–1.00)
GFR calc non Af Amer: 36 mL/min — ABNORMAL LOW (ref >60)
GFR, EST AFRICAN AMERICAN: 42 mL/min — AB (ref >60)

## 2016-12-01 LAB — AMMONIA: AMMONIA: 10 umol/L (ref 9–35)

## 2016-12-01 LAB — ETHANOL

## 2016-12-01 MED ORDER — MODAFINIL 200 MG PO TABS
200.0000 mg | ORAL_TABLET | Freq: Every day | ORAL | Status: DC
  Administered 2016-12-02 – 2016-12-04 (×3): 200 mg via ORAL
  Filled 2016-12-01 (×3): qty 1

## 2016-12-01 MED ORDER — PANTOPRAZOLE SODIUM 40 MG PO TBEC
40.0000 mg | DELAYED_RELEASE_TABLET | Freq: Every day | ORAL | Status: DC
  Administered 2016-12-02 – 2016-12-04 (×3): 40 mg via ORAL
  Filled 2016-12-01 (×3): qty 1

## 2016-12-01 MED ORDER — ONDANSETRON HCL 4 MG PO TABS
4.0000 mg | ORAL_TABLET | Freq: Four times a day (QID) | ORAL | Status: DC | PRN
  Administered 2016-12-03: 4 mg via ORAL
  Filled 2016-12-01: qty 1

## 2016-12-01 MED ORDER — ONDANSETRON HCL 4 MG/2ML IJ SOLN: 4.0000 mg | Freq: Four times a day (QID) | INTRAMUSCULAR | Status: DC | PRN

## 2016-12-01 MED ORDER — SPIRONOLACTONE 25 MG PO TABS
25.0000 mg | ORAL_TABLET | Freq: Every day | ORAL | Status: DC
  Administered 2016-12-02 – 2016-12-04 (×3): 25 mg via ORAL
  Filled 2016-12-01 (×3): qty 1

## 2016-12-01 MED ORDER — MAGNESIUM OXIDE 400 (241.3 MG) MG PO TABS
400.0000 mg | ORAL_TABLET | Freq: Every day | ORAL | Status: DC
  Administered 2016-12-01 – 2016-12-03 (×3): 400 mg via ORAL
  Filled 2016-12-01 (×3): qty 1

## 2016-12-01 MED ORDER — TRAMADOL HCL 50 MG PO TABS
50.0000 mg | ORAL_TABLET | Freq: Four times a day (QID) | ORAL | Status: DC | PRN
  Administered 2016-12-02: 50 mg via ORAL
  Filled 2016-12-01 (×2): qty 1

## 2016-12-01 MED ORDER — CARVEDILOL 6.25 MG PO TABS
6.2500 mg | ORAL_TABLET | Freq: Two times a day (BID) | ORAL | Status: DC
  Administered 2016-12-01 – 2016-12-04 (×6): 6.25 mg via ORAL
  Filled 2016-12-01 (×6): qty 1

## 2016-12-01 MED ORDER — LEVOTHYROXINE SODIUM 25 MCG PO TABS
125.0000 ug | ORAL_TABLET | Freq: Every day | ORAL | Status: DC
  Administered 2016-12-02 – 2016-12-04 (×3): 125 ug via ORAL
  Filled 2016-12-01 (×3): qty 1

## 2016-12-01 MED ORDER — ACETAMINOPHEN 650 MG RE SUPP: 650.0000 mg | Freq: Four times a day (QID) | RECTAL | Status: DC | PRN

## 2016-12-01 MED ORDER — DICLOFENAC SODIUM 1 % TD GEL
4.0000 g | Freq: Four times a day (QID) | TRANSDERMAL | Status: DC | PRN
  Filled 2016-12-01: qty 100

## 2016-12-01 MED ORDER — FLUTICASONE PROPIONATE 50 MCG/ACT NA SUSP
1.0000 | Freq: Every day | NASAL | Status: DC
  Administered 2016-12-02 – 2016-12-04 (×3): 1 via NASAL
  Filled 2016-12-01: qty 16

## 2016-12-01 MED ORDER — VITAMIN D3 25 MCG (1000 UNIT) PO TABS
3000.0000 [IU] | ORAL_TABLET | Freq: Every morning | ORAL | Status: DC
  Administered 2016-12-02 – 2016-12-04 (×3): 3000 [IU] via ORAL
  Filled 2016-12-01 (×3): qty 3

## 2016-12-01 MED ORDER — ASPIRIN EC 81 MG PO TBEC
81.0000 mg | DELAYED_RELEASE_TABLET | Freq: Every day | ORAL | Status: DC
  Administered 2016-12-02 – 2016-12-04 (×3): 81 mg via ORAL
  Filled 2016-12-01 (×3): qty 1

## 2016-12-01 MED ORDER — LOSARTAN POTASSIUM 50 MG PO TABS
100.0000 mg | ORAL_TABLET | Freq: Every morning | ORAL | Status: DC
  Administered 2016-12-02 – 2016-12-04 (×3): 100 mg via ORAL
  Filled 2016-12-01 (×3): qty 2

## 2016-12-01 MED ORDER — DEXTROSE 5 % IV SOLN
1.0000 g | Freq: Once | INTRAVENOUS | Status: AC
  Administered 2016-12-01: 1 g via INTRAVENOUS
  Filled 2016-12-01: qty 10

## 2016-12-01 MED ORDER — ACETAMINOPHEN 325 MG PO TABS
650.0000 mg | ORAL_TABLET | Freq: Four times a day (QID) | ORAL | Status: DC | PRN
Start: 2016-12-01 — End: 2016-12-02
  Administered 2016-12-01: 650 mg via ORAL
  Filled 2016-12-01: qty 2

## 2016-12-01 MED ORDER — MECLIZINE HCL 25 MG PO TABS: 25.0000 mg | ORAL_TABLET | Freq: Three times a day (TID) | ORAL | Status: DC | PRN

## 2016-12-01 MED ORDER — ENOXAPARIN SODIUM 40 MG/0.4ML ~~LOC~~ SOLN
40.0000 mg | SUBCUTANEOUS | Status: DC
  Administered 2016-12-02 – 2016-12-04 (×3): 40 mg via SUBCUTANEOUS
  Filled 2016-12-01 (×3): qty 0.4

## 2016-12-01 MED ORDER — ATORVASTATIN CALCIUM 40 MG PO TABS
40.0000 mg | ORAL_TABLET | Freq: Every day | ORAL | Status: DC
  Administered 2016-12-02 – 2016-12-04 (×3): 40 mg via ORAL
  Filled 2016-12-01 (×3): qty 1

## 2016-12-01 MED ORDER — LORATADINE 10 MG PO TABS
10.0000 mg | ORAL_TABLET | Freq: Every day | ORAL | Status: DC
  Administered 2016-12-02 – 2016-12-04 (×3): 10 mg via ORAL
  Filled 2016-12-01 (×3): qty 1

## 2016-12-01 MED ORDER — CAMPHOR-MENTHOL 0.5-0.5 % EX LOTN
1.0000 "application " | TOPICAL_LOTION | Freq: Two times a day (BID) | CUTANEOUS | Status: DC | PRN
  Filled 2016-12-01: qty 222

## 2016-12-01 MED ORDER — DEXTROSE 5 % IV SOLN
1.0000 g | INTRAVENOUS | Status: DC
  Administered 2016-12-02 – 2016-12-03 (×2): 1 g via INTRAVENOUS
  Filled 2016-12-01 (×3): qty 10

## 2016-12-01 NOTE — ED Provider Notes (Signed)
[facility] EMERGENCY DEPARTMENT Provider Note   CSN: [id number] Arrival date & time: 12/01/16  1034     History   Chief Complaint Chief Complaint  Patient presents with  . Neck Pain    HPI Bailey Duke is a 83 y.o. female.  HPI Patient presents by EMS for neck pain.  She has a history of chronic neck pain but states is been worse over the last week.  Denies any recent falls.  Denies focal weakness or numbness.  Vital signs stable en route.  Per EMS patient had multiple bottles of pain medication at her residence.  She was unsure as to how much medication she took.  Slurring of words and question intoxication by EMS. Past Medical History:  Diagnosis Date  . B12 deficiency anemia   . Benign paroxysmal vertigo   . CAD (coronary artery disease)   . Essential hypertension   . GERD (gastroesophageal reflux disease)   . Hyperlipidemia   . Hypersomnia   . Hypertension   . Hypothyroid   . OAB (overactive bladder)   . Osteoarthritis   . Proliferative retinopathy of left eye    Non-Diabetic  . Sleep apnea    on CPAP qhs  . Spinal stenosis   . Systolic CHF (HCC)   . Vitamin D deficiency     Patient Active Problem List   Diagnosis Date Noted  . Dementia 09/15/2014  . TIA (transient ischemic attack) 01/24/2013  . Hypertension   . Depressive disorder   . Anxiety   . GERD (gastroesophageal reflux disease)   . Allergic rhinitis due to pollen 01/04/2011  . Allergic conjunctivitis 01/04/2011  . Obstructive sleep apnea 01/04/2011  . Hypothyroid 01/04/2011    Past Surgical History:  Procedure Laterality Date  . JOINT REPLACEMENT      OB History    No data available       Home Medications    Prior to Admission medications   Medication Sig Start Date End Date Taking? Authorizing Provider  acetaminophen (TYLENOL) 325 MG tablet Take 650 mg by mouth every 4 (four) hours as needed for mild pain or fever.    [provider]  Armodafinil (NUVIGIL) 250 MG  tablet Take 1 tablet (250 mg total) by mouth every morning. Patient not taking: Reported on 04/08/2015 01/27/13   Meredeth IdeLama, Gagan S, MD  aspirin EC 81 MG tablet Take 81 mg by mouth daily. 03/06/14   [provider]  atorvastatin (LIPITOR) 20 MG tablet Take 20 mg by mouth daily. 08/19/14 08/19/15  [provider]  camphor-menthol Wynelle Fanny(SARNA) lotion Apply 1 application topically 2 (two) times daily as needed for itching.    [provider]  carvedilol (COREG) 6.25 MG tablet Take 6.25 mg by mouth 2 (two) times daily with a meal.    [provider]  cetirizine (ZYRTEC) 10 MG tablet Take 10 mg by mouth daily as needed for allergies or rhinitis.    [provider]  Cholecalciferol (VITAMIN D) 2000 UNITS CAPS Take 2,000 Units by mouth every morning.    [provider]  diclofenac sodium (VOLTAREN) 1 % GEL Apply 4 g topically every 6 (six) hours as needed (for lumbosacral spondylosis).    [provider]  fluticasone (FLONASE) 50 MCG/ACT nasal spray Place 1 spray into both nostrils daily. 03/05/14 04/08/15  [provider]  furosemide (LASIX) 20 MG tablet Take 40 mg by mouth daily. 08/02/14 08/02/15  [provider]  gabapentin (NEURONTIN) 300 MG capsule Take  1 capsule in the morning and 2 capsules in the evening. 03/18/14 03/18/16  [provider]  levothyroxine (SYNTHROID, LEVOTHROID) 100 MCG tablet Take 100 mcg by mouth daily before breakfast.    [provider]  Liniments (PENETRAN PLUS EX) Apply 1 application topically 3 (three) times daily as needed (for joint pain).    [provider]  losartan (COZAAR) 100 MG tablet Take 100 mg by mouth every morning.    [provider]  magnesium oxide (MAG-OX) 400 MG tablet Take 400 mg by mouth at bedtime.    [provider]  meclizine (ANTIVERT) 25 MG tablet Take 25 mg by mouth 3 (three) times daily as needed for dizziness.    [provider]  Menthol,  Topical Analgesic, (BIOFREEZE) 4 % GEL Apply 1 application topically 3 (three) times daily as needed (for joint pain).    [provider]  modafinil (PROVIGIL) 200 MG tablet Take 200 mg by mouth daily. 08/26/14   [provider]  Multiple Vitamins-Minerals (MULTIVITAMIN PO) Take 1 tablet by mouth every morning.    [provider]  omeprazole (PRILOSEC) 20 MG capsule Take 20 mg by mouth daily.    [provider]  oxybutynin (DITROPAN-XL) 5 MG 24 hr tablet Take 5 mg by mouth at bedtime.    [provider]  RA KRILL OIL 500 MG CAPS Take 1 capsule by mouth daily. Reported on 04/08/2015 04/01/14   [provider]  senna (SENOKOT) 8.6 MG TABS tablet Take 2 tablets by mouth daily as needed for mild constipation.    [provider]  spironolactone (ALDACTONE) 25 MG tablet Take 25 mg by mouth daily. 03/06/14   [provider]  traMADol (ULTRAM) 50 MG tablet Take 50 mg by mouth every 6 (six) hours as needed for moderate pain.    [provider]  Turmeric (RA TURMERIC) 500 MG CAPS Take 500 mg by mouth daily. Reported on 04/08/2015    [provider]  vitamin C (ASCORBIC ACID) 500 MG tablet Take 1,000 mg by mouth every morning. Reported on 04/08/2015    [provider]    Family History Family History  Problem Relation Age of Onset  . Breast cancer Mother   . Other Father        Hardening of the arteries to the head    Social History Social History   Tobacco Use  . Smoking status: Never Smoker  Substance Use Topics  . Alcohol use: Yes    Alcohol/week: 1.2 oz    Types: 2 Shots of liquor per week  . Drug use: No     Allergies   Tetanus toxoids; Penicillins; Procaine; and Sulfa antibiotics   Review of Systems Review of Systems  Constitutional: Negative for chills and fever.  HENT: Negative for facial swelling.   Eyes: Negative for visual disturbance.  Respiratory: Negative for cough and shortness of  breath.   Cardiovascular: Negative for chest pain.  Gastrointestinal: Negative for abdominal pain, nausea and vomiting.  Genitourinary: Negative for difficulty urinating, flank pain, frequency and hematuria.  Musculoskeletal: Positive for back pain, myalgias and neck pain.  Skin: Negative for rash and wound.  Neurological: Negative for dizziness, weakness, light-headedness, numbness and headaches.  All other systems reviewed and are negative.    Physical Exam Updated Vital Signs BP (!) 146/77   Pulse 85   Temp 98.3 F (36.8 C) (Oral)   Resp (!) 21   Ht 5\' 4"  (1.626 m)  Wt 81.6 kg (180 lb)   SpO2 96%   BMI 30.90 kg/m   Physical Exam  Constitutional: She is oriented to person, place, and time. She appears well-developed and well-nourished. No distress.  Disheveled appearing  HENT:  Head: Normocephalic and atraumatic.  Mouth/Throat: Oropharynx is clear and moist. No oropharyngeal exudate.  Eyes: EOM are normal. Pupils are equal, round, and reactive to light.  Neck: Normal range of motion. Neck supple.  Diffuse midline cervical tenderness to palpation.  Patient is also tender over the right paracervical and right trapezius muscles  Cardiovascular: Normal rate and regular rhythm.  Pulmonary/Chest: Effort normal and breath sounds normal. No respiratory distress. She has no wheezes. She has no rales. She exhibits tenderness.  Patient with mild tenderness of the left anterior chest.  Obvious bruising present.  No crepitance or deformity.  Abdominal: Soft. Bowel sounds are normal. She exhibits no distension and no mass. There is no tenderness. There is no rebound and no guarding. No hernia.  Musculoskeletal: Normal range of motion. She exhibits edema. She exhibits no tenderness.  1+ bilateral pitting edema.  No midline thoracic or lumbar tenderness.  No CVA tenderness.  Distal pulses 2+.  Neurological: She is alert and oriented to person, place, and time.  Mildly confused.  Slurring  words.  5/5 motor in all extremities.  Sensation grossly intact.  Skin: Skin is warm and dry. Capillary refill takes less than 2 seconds. No rash noted. She is not diaphoretic. No erythema.  Psychiatric: She has a normal mood and affect. Her behavior is normal.  Nursing note and vitals reviewed.    ED Treatments / Results  Labs (all labs ordered are listed, but only abnormal results are displayed) Labs Reviewed  CBC WITH DIFFERENTIAL/PLATELET - Abnormal; Notable for the following components:      Result Value   WBC 12.0 (*)    RBC 3.26 (*)    Hemoglobin 10.1 (*)    HCT 30.1 (*)    Neutro Abs 9.2 (*)    All other components within normal limits  URINALYSIS, ROUTINE W REFLEX MICROSCOPIC - Abnormal; Notable for the following components:   APPearance CLOUDY (*)    Hgb urine dipstick TRACE (*)    Nitrite POSITIVE (*)    Leukocytes, UA LARGE (*)    All other components within normal limits  URINALYSIS, MICROSCOPIC (REFLEX) - Abnormal; Notable for the following components:   Bacteria, UA MANY (*)    Squamous Epithelial / LPF 0-5 (*)    All other components within normal limits  URINE CULTURE  ETHANOL  RAPID URINE DRUG SCREEN, HOSP PERFORMED  COMPREHENSIVE METABOLIC PANEL  LIPASE, BLOOD    EKG  EKG Interpretation None       Radiology Ct Head Wo Contrast  Result Date: 12/01/2016 CLINICAL DATA:  Severe neck pain for 1 week. Altered level consciousness. EXAM: CT HEAD WITHOUT CONTRAST CT CERVICAL SPINE WITHOUT CONTRAST TECHNIQUE: Multidetector CT imaging of the head and cervical spine was performed following the standard protocol without intravenous contrast. Multiplanar CT image reconstructions of the cervical spine were also generated. COMPARISON:  None. FINDINGS: CT HEAD FINDINGS Brain: No evidence of acute infarction, hemorrhage, extra-axial collection, ventriculomegaly, or mass effect. Generalized cerebral atrophy. Periventricular white matter low attenuation likely secondary  to microangiopathy. Vascular: Cerebrovascular atherosclerotic calcifications are noted. Skull: Negative for fracture or focal lesion. Sinuses/Orbits: Visualized portions of the orbits are unremarkable. Visualized portions of the paranasal sinuses and mastoid air cells are unremarkable. Other: None. CT  CERVICAL SPINE FINDINGS Alignment: 6 mm anterolisthesis of C4 on C5 secondary to severe bilateral facet arthropathy. Skull base and vertebrae: No acute fracture. No primary bone lesion or focal pathologic process. Soft tissues and spinal canal: No prevertebral fluid or swelling. No visible canal hematoma. Disc levels: Degenerative disc disease with disc height loss at C3-4, C4-5, C5-6 and C6-7. Broad-based disc osteophyte complex at C3-4 with severe bilateral facet arthropathy. At C4-5 there is severe bilateral foraminal stenosis and severe bilateral facet arthropathy. At C5-6 there is a broad-based disc osteophyte complex, bilateral uncovertebral degenerative changes, bilateral foraminal stenosis and bilateral facet arthropathy. At C6-7 there is right uncovertebral degenerative change and right foraminal stenosis. Severe the atlantodental arthropathy. Upper chest: Lung apices are clear. Other: No fluid collection or hematoma. IMPRESSION: 1. No acute intracranial pathology. 2. No acute osseous injury of the cervical spine. Electronically Signed   By: Elige Ko   On: 12/01/2016 11:54   Ct Cervical Spine Wo Contrast  Result Date: 12/01/2016 CLINICAL DATA:  Severe neck pain for 1 week. Altered level consciousness. EXAM: CT HEAD WITHOUT CONTRAST CT CERVICAL SPINE WITHOUT CONTRAST TECHNIQUE: Multidetector CT imaging of the head and cervical spine was performed following the standard protocol without intravenous contrast. Multiplanar CT image reconstructions of the cervical spine were also generated. COMPARISON:  None. FINDINGS: CT HEAD FINDINGS Brain: No evidence of acute infarction, hemorrhage, extra-axial  collection, ventriculomegaly, or mass effect. Generalized cerebral atrophy. Periventricular white matter low attenuation likely secondary to microangiopathy. Vascular: Cerebrovascular atherosclerotic calcifications are noted. Skull: Negative for fracture or focal lesion. Sinuses/Orbits: Visualized portions of the orbits are unremarkable. Visualized portions of the paranasal sinuses and mastoid air cells are unremarkable. Other: None. CT CERVICAL SPINE FINDINGS Alignment: 6 mm anterolisthesis of C4 on C5 secondary to severe bilateral facet arthropathy. Skull base and vertebrae: No acute fracture. No primary bone lesion or focal pathologic process. Soft tissues and spinal canal: No prevertebral fluid or swelling. No visible canal hematoma. Disc levels: Degenerative disc disease with disc height loss at C3-4, C4-5, C5-6 and C6-7. Broad-based disc osteophyte complex at C3-4 with severe bilateral facet arthropathy. At C4-5 there is severe bilateral foraminal stenosis and severe bilateral facet arthropathy. At C5-6 there is a broad-based disc osteophyte complex, bilateral uncovertebral degenerative changes, bilateral foraminal stenosis and bilateral facet arthropathy. At C6-7 there is right uncovertebral degenerative change and right foraminal stenosis. Severe the atlantodental arthropathy. Upper chest: Lung apices are clear. Other: No fluid collection or hematoma. IMPRESSION: 1. No acute intracranial pathology. 2. No acute osseous injury of the cervical spine. Electronically Signed   By: Elige Ko   On: 12/01/2016 11:54    Procedures Procedures (including critical care time)  Medications Ordered in ED Medications  cefTRIAXone (ROCEPHIN) 1 g in dextrose 5 % 50 mL IVPB (1 g Intravenous New Bag/Given 12/01/16 1327)     Initial Impression / Assessment and Plan / ED Course  I have reviewed the triage vital signs and the nursing notes.  Pertinent labs & imaging results that were available during my care of the  patient were reviewed by me and considered in my medical decision making (see chart for details).    No acute findings on CT head or neck.  Patient does have evidence of urinary tract infection.  Likely her encephalopathy is due to the infection.  Urine is been cultured and patient has been started on IV Rocephin.  Discussed with Dr. Raynald Kemp.  Will accept patient in transfer for MedSurg bed.  Final Clinical Impressions(s) / ED Diagnoses   Final diagnoses:  Encephalopathy  Lower urinary tract infection  Neck pain    ED Discharge Orders    None       Loren Racer, MD 12/01/16 [phone]

## 2016-12-01 NOTE — Treatment Plan (Signed)
Discussed case with Dr. Ranae PalmsYelverton. 83yo who presents from ALF, [place] initially with complaints of neck pain. Patient found to have mild AMS with some slurred speech. Pt brought to ED where UA was suggestive of UTI with WBC of 12.0. CT head unremarkable. CT neck clear. Concerns for metabolic encephalopathy secondary to UTI, urine culture ordered, pending.  [facility]

## 2016-12-01 NOTE — ED Notes (Signed)
Patients HC POA is  [phone] - Bailey Duke  Bailey PicklesLisa Duke  The patients caregiver on M.W.F is [phone]

## 2016-12-01 NOTE — H&P (Signed)
History and Physical    Bailey Duke ZOX:[id number] DOB: 02-14-1925 DOA: 12/01/2016  PCP: Bill Salinas I, NP  Patient coming from: Nursing home.  Chief Complaint: Neck pain and confusion.  HPI: Bailey Duke is a 83 y.o. female with history of hypertension, hyperlipidemia, hypothyroidism, CAD presents to the ER with complaints of increasing neck pain radiating to the head last 1 week.  As per the reports patient has chronic neck pain which is acutely worsened.  Denies any trauma or fall.  Patient otherwise denies any chest pain shortness of breath weakness of the extremities.  ED Course: In the ER patient is found to be confused and slurred lethargic.  There was concern that patient may be taking more than her usual dose of medications.  Urine was also compatible with UTI.  Patient was started on ceftriaxone and admitted for further management.  On my exam patient is lethargic but oriented to time place and person.  Review of Systems: As per HPI, rest all negative.   Past Medical History:  Diagnosis Date  . B12 deficiency anemia   . Benign paroxysmal vertigo   . CAD (coronary artery disease)   . Essential hypertension   . GERD (gastroesophageal reflux disease)   . Hyperlipidemia   . Hypersomnia   . Hypertension   . Hypothyroid   . OAB (overactive bladder)   . Osteoarthritis   . Proliferative retinopathy of left eye    Non-Diabetic  . Sleep apnea    on CPAP qhs  . Spinal stenosis   . Systolic CHF (HCC)   . Vitamin D deficiency     Past Surgical History:  Procedure Laterality Date  . JOINT REPLACEMENT       reports that  has never smoked. she has never used smokeless tobacco. She reports that she drinks about 1.2 oz of alcohol per week. She reports that she does not use drugs.  Allergies  Allergen Reactions  . Tetanus Toxoids Other (See Comments)    RED LINE FROM INJECTION SITE DOWN ARM  . Penicillins Other (See Comments)    1940 FORMS - TINY ITCHY PIMPLES ON  FINGERS  . Procaine Other (See Comments)    ?    Marland Kitchen Sulfa Antibiotics     Family History  Problem Relation Age of Onset  . Breast cancer Mother   . Other Father        Hardening of the arteries to the head    Prior to Admission medications   Medication Sig Start Date End Date Taking? Authorizing Provider  acetaminophen (TYLENOL) 325 MG tablet Take 650 mg by mouth every 4 (four) hours as needed for mild pain or fever.   Yes [provider]  aspirin EC 81 MG tablet Take 81 mg by mouth daily. 03/06/14  Yes [provider]  atorvastatin (LIPITOR) 40 MG tablet Take 40 mg daily by mouth.   Yes [provider]  camphor-menthol Wynelle Fanny) lotion Apply 1 application topically 2 (two) times daily as needed for itching.   Yes [provider]  carvedilol (COREG) 6.25 MG tablet Take 6.25 mg by mouth 2 (two) times daily with a meal.   Yes [provider]  cetirizine (ZYRTEC) 10 MG tablet Take 10 mg by mouth daily as needed for allergies or rhinitis.   Yes [provider]  Cholecalciferol (VITAMIN D) 2000 UNITS CAPS Take 3,000 Units every morning by mouth.    Yes [provider]  diclofenac sodium (VOLTAREN) 1 % GEL  Apply 4 g topically every 6 (six) hours as needed (for lumbosacral spondylosis).   Yes [provider]  fluticasone (FLONASE) 50 MCG/ACT nasal spray Place 1 spray into both nostrils daily. 03/05/14 12/01/16 Yes [provider]  gabapentin (NEURONTIN) 300 MG capsule Take 1 capsule in the morning and 2 capsules in the evening. 03/18/14 12/01/16 Yes [provider]  levothyroxine (SYNTHROID, LEVOTHROID) 125 MCG tablet Take 125 mcg daily before breakfast by mouth.   Yes [provider]  Liniments (PENETRAN PLUS EX) Apply 1 application topically 3 (three) times daily as needed (for joint pain).   Yes [provider]  losartan (COZAAR) 100 MG tablet Take 100 mg by mouth every morning.   Yes [provider]  magnesium oxide (MAG-OX) 400 MG tablet Take 400 mg by mouth at bedtime.   Yes [provider]  meclizine (ANTIVERT) 25 MG tablet Take 25 mg by mouth 3 (three) times daily as needed for dizziness.   Yes [provider]  Menthol, Topical Analgesic, (BIOFREEZE) 4 % GEL Apply 1 application topically 3 (three) times daily as needed (for joint pain).   Yes [provider]  modafinil (PROVIGIL) 200 MG tablet Take 200 mg by mouth daily. 08/26/14  Yes [provider]  Multiple Vitamins-Minerals (MULTIVITAMIN PO) Take 1 tablet by mouth every morning.   Yes [provider]  omeprazole (PRILOSEC) 20 MG capsule Take 20 mg by mouth daily.   Yes [provider]  oxybutynin (DITROPAN-XL) 5 MG 24 hr tablet Take 5 mg 2 (two) times daily by mouth.    Yes [provider]  RA KRILL OIL 500 MG CAPS Take 1 capsule by mouth daily. Reported on 04/08/2015 04/01/14  Yes [provider]  spironolactone (ALDACTONE) 25 MG tablet Take 25 mg by mouth daily. 03/06/14  Yes [provider]  torsemide (DEMADEX) 20 MG tablet Take 20 mg daily by mouth. 10/20/16  Yes [provider]  traMADol (ULTRAM) 50 MG tablet Take 50 mg by mouth every 6 (six) hours as needed for moderate pain.   Yes [provider]  Turmeric (RA TURMERIC) 500 MG CAPS Take 500 mg by mouth daily. Reported on 04/08/2015   Yes [provider]  vitamin C (ASCORBIC ACID) 500 MG tablet Take 1,000 mg by mouth every morning. Reported on 04/08/2015   Yes [provider]    Physical Exam: Vitals:   12/01/16 1630 12/01/16 1700 12/01/16 1800 12/01/16 2040  BP: (!) 121/52 (!) 114/52 (!) 132/57 (!) 154/76  Pulse: 66 82 76 76  Resp: 18 13 (!) 21 18  Temp:    98.8 F (37.1 C)  TempSrc:    Oral  SpO2: 94% 96% 100% 98%  Weight:    85 kg (187 lb 6.3 oz)  Height:    5\' 4"  (1.626 m)      Constitutional: Moderately built and nourished Vitals:    12/01/16 1630 12/01/16 1700 12/01/16 1800 12/01/16 2040  BP: (!) 121/52 (!) 114/52 (!) 132/57 (!) 154/76  Pulse: 66 82 76 76  Resp: 18 13 (!) 21 18  Temp:    98.8 F (37.1 C)  TempSrc:    Oral  SpO2: 94% 96% 100% 98%  Weight:    85 kg (187 lb 6.3 oz)  Height:    5\' 4"  (1.626 m)   Eyes: Anicteric no pallor. ENMT: No discharge from the ears eyes nose or mouth. Neck: No neck rigidity.  No mass felt. Respiratory: No  rhonchi or crepitations. Cardiovascular: S1-S2 heard no murmurs appreciated. Abdomen: Soft nontender bowel sounds present. Musculoskeletal: No edema.  No joint effusion. Skin: No rash. Neurologic: Mildly lethargic but oriented to time place and person.  Moves all extremities. Psychiatric: Mildly lethargic.   Labs on Admission: I have personally reviewed following labs and imaging studies  CBC: Recent Labs  Lab 12/01/16 1148  WBC 12.0*  NEUTROABS 9.2*  HGB 10.1*  HCT 30.1*  MCV 92.3  PLT 279   Basic Metabolic Panel: Recent Labs  Lab 12/01/16 1725  NA 135  K 3.9  CL 102  CO2 23  GLUCOSE 175*  BUN 39*  CREATININE 1.29*  CALCIUM 10.2   GFR: Estimated Creatinine Clearance: 31.2 mL/min (A) (by C-G formula based on SCr of 1.29 mg/dL (H)). Liver Function Tests: Recent Labs  Lab 12/01/16 1725  AST 56*  ALT 43  ALKPHOS 77  BILITOT 0.3  PROT 6.2*  ALBUMIN 3.1*   Recent Labs  Lab 12/01/16 1725  LIPASE 28   No results for input(s): AMMONIA in the last 168 hours. Coagulation Profile: No results for input(s): INR, PROTIME in the last 168 hours. Cardiac Enzymes: No results for input(s): CKTOTAL, CKMB, CKMBINDEX, TROPONINI in the last 168 hours. BNP (last 3 results) No results for input(s): PROBNP in the last 8760 hours. HbA1C: No results for input(s): HGBA1C in the last 72 hours. CBG: No results for input(s): GLUCAP in the last 168 hours. Lipid Profile: No results for input(s): CHOL, HDL, LDLCALC, TRIG, CHOLHDL, LDLDIRECT in the last 72  hours. Thyroid Function Tests: No results for input(s): TSH, T4TOTAL, FREET4, T3FREE, THYROIDAB in the last 72 hours. Anemia Panel: No results for input(s): VITAMINB12, FOLATE, FERRITIN, TIBC, IRON, RETICCTPCT in the last 72 hours. Urine analysis:    Component Value Date/Time   COLORURINE YELLOW 12/01/2016 1225   APPEARANCEUR CLOUDY (A) 12/01/2016 1225   LABSPEC 1.020 12/01/2016 1225   PHURINE 5.5 12/01/2016 1225   GLUCOSEU NEGATIVE 12/01/2016 1225   HGBUR TRACE (A) 12/01/2016 1225   BILIRUBINUR NEGATIVE 12/01/2016 1225   KETONESUR NEGATIVE 12/01/2016 1225   PROTEINUR NEGATIVE 12/01/2016 1225   UROBILINOGEN 0.2 09/15/2014 2345   NITRITE POSITIVE (A) 12/01/2016 1225   LEUKOCYTESUR LARGE (A) 12/01/2016 1225   Sepsis Labs: @LABRCNTIP (procalcitonin:4,lacticidven:4) )No results found for this or any previous visit (from the past 240 hour(s)).   Radiological Exams on Admission: Ct Head Wo Contrast  Result Date: 12/01/2016 CLINICAL DATA:  Severe neck pain for 1 week. Altered level consciousness. EXAM: CT HEAD WITHOUT CONTRAST CT CERVICAL SPINE WITHOUT CONTRAST TECHNIQUE: Multidetector CT imaging of the head and cervical spine was performed following the standard protocol without intravenous contrast. Multiplanar CT image reconstructions of the cervical spine were also generated. COMPARISON:  None. FINDINGS: CT HEAD FINDINGS Brain: No evidence of acute infarction, hemorrhage, extra-axial collection, ventriculomegaly, or mass effect. Generalized cerebral atrophy. Periventricular white matter low attenuation likely secondary to microangiopathy. Vascular: Cerebrovascular atherosclerotic calcifications are noted. Skull: Negative for fracture or focal lesion. Sinuses/Orbits: Visualized portions of the orbits are unremarkable. Visualized portions of the paranasal sinuses and mastoid air cells are unremarkable. Other: None. CT CERVICAL SPINE FINDINGS Alignment: 6 mm anterolisthesis of C4 on C5 secondary  to severe bilateral facet arthropathy. Skull base and vertebrae: No acute fracture. No primary bone lesion or focal pathologic process. Soft tissues and spinal canal: No prevertebral fluid or swelling. No visible canal hematoma. Disc levels: Degenerative disc disease with disc height loss at C3-4, C4-5, C5-6 and  C6-7. Broad-based disc osteophyte complex at C3-4 with severe bilateral facet arthropathy. At C4-5 there is severe bilateral foraminal stenosis and severe bilateral facet arthropathy. At C5-6 there is a broad-based disc osteophyte complex, bilateral uncovertebral degenerative changes, bilateral foraminal stenosis and bilateral facet arthropathy. At C6-7 there is right uncovertebral degenerative change and right foraminal stenosis. Severe the atlantodental arthropathy. Upper chest: Lung apices are clear. Other: No fluid collection or hematoma. IMPRESSION: 1. No acute intracranial pathology. 2. No acute osseous injury of the cervical spine. Electronically Signed   By: Elige KoHetal  Patel   On: 12/01/2016 11:54   Ct Cervical Spine Wo Contrast  Result Date: 12/01/2016 CLINICAL DATA:  Severe neck pain for 1 week. Altered level consciousness. EXAM: CT HEAD WITHOUT CONTRAST CT CERVICAL SPINE WITHOUT CONTRAST TECHNIQUE: Multidetector CT imaging of the head and cervical spine was performed following the standard protocol without intravenous contrast. Multiplanar CT image reconstructions of the cervical spine were also generated. COMPARISON:  None. FINDINGS: CT HEAD FINDINGS Brain: No evidence of acute infarction, hemorrhage, extra-axial collection, ventriculomegaly, or mass effect. Generalized cerebral atrophy. Periventricular white matter low attenuation likely secondary to microangiopathy. Vascular: Cerebrovascular atherosclerotic calcifications are noted. Skull: Negative for fracture or focal lesion. Sinuses/Orbits: Visualized portions of the orbits are unremarkable. Visualized portions of the paranasal sinuses and  mastoid air cells are unremarkable. Other: None. CT CERVICAL SPINE FINDINGS Alignment: 6 mm anterolisthesis of C4 on C5 secondary to severe bilateral facet arthropathy. Skull base and vertebrae: No acute fracture. No primary bone lesion or focal pathologic process. Soft tissues and spinal canal: No prevertebral fluid or swelling. No visible canal hematoma. Disc levels: Degenerative disc disease with disc height loss at C3-4, C4-5, C5-6 and C6-7. Broad-based disc osteophyte complex at C3-4 with severe bilateral facet arthropathy. At C4-5 there is severe bilateral foraminal stenosis and severe bilateral facet arthropathy. At C5-6 there is a broad-based disc osteophyte complex, bilateral uncovertebral degenerative changes, bilateral foraminal stenosis and bilateral facet arthropathy. At C6-7 there is right uncovertebral degenerative change and right foraminal stenosis. Severe the atlantodental arthropathy. Upper chest: Lung apices are clear. Other: No fluid collection or hematoma. IMPRESSION: 1. No acute intracranial pathology. 2. No acute osseous injury of the cervical spine. Electronically Signed   By: Elige KoHetal  Patel   On: 12/01/2016 11:54    EKG: Independently reviewed.  Normal sinus rhythm.  Assessment/Plan Principal Problem:   Metabolic encephalopathy Active Problems:   Obstructive sleep apnea   Hypothyroid   Hypertension   Dementia   Acute lower UTI   Acute encephalopathy   Lower urinary tract infection    1. Acute encephalopathy -likely secondary to UTI and medications.  Would hold off her tramadol and I have dosed Neurontin for tomorrow in the morning of today's dose.  If patient continues to remain confused then hold off Neurontin.  Closely follow.  Check ammonia.  Consider ABG if patient remains lethargic. 2. UTI -patient is placed on ceftriaxone.,  Urine cultures. 3. Neck pain -check MRI.  Troponin. 4. Sleep apnea on CPAP. 5. Hypertension on Cozaar and Coreg. 6. Chronic kidney disease  stage II -creatinine appears to be at baseline. 7. Chronic anemia -follow CBC check anemia panel. 8. Hypothyroidism on Synthroid.  Check TSH. 9. Hyperlipidemia on statins.   DVT prophylaxis: Lovenox. Code Status: DNR. Family Communication: Discussed with patient. Disposition Plan: Nursing home. Consults called: None. Admission status: Observation.   Eduard ClosKAKRAKANDY,Roby Donaway N. MD [facility] Pager [phone].  If 7PM-7AM, please contact night-coverage [email or web address] Password [email or web address]  12/01/2016, 9:29 PM

## 2016-12-01 NOTE — Progress Notes (Signed)
On call notified of pts arrival

## 2016-12-01 NOTE — ED Notes (Signed)
Patient transported to CT 

## 2016-12-01 NOTE — ED Notes (Signed)
  Debbie, RN  - called report on [address]

## 2016-12-01 NOTE — ED Triage Notes (Signed)
[place] brought [place] by [facility] from [place]. Pt lived alone in independent apartment. Pt denies falls. C/o severe neck pain x 1 week. Dr. Ranae PalmsYelverton evaluating pt during triage

## 2016-12-01 NOTE — ED Notes (Signed)
[facility]

## 2016-12-01 NOTE — ED Notes (Signed)
Patient in via EMS - getting patinet undresses and she has noted old bruising to her left shoulder and down her left arm. Patient denies any Fall or known injury. The patient

## 2016-12-02 ENCOUNTER — Encounter (HOSPITAL_COMMUNITY): Payer: Self-pay

## 2016-12-02 ENCOUNTER — Inpatient Hospital Stay (HOSPITAL_COMMUNITY): Payer: Medicare Other

## 2016-12-02 ENCOUNTER — Other Ambulatory Visit: Payer: Self-pay

## 2016-12-02 DIAGNOSIS — G4733 Obstructive sleep apnea (adult) (pediatric): Secondary | ICD-10-CM

## 2016-12-02 DIAGNOSIS — G9340 Encephalopathy, unspecified: Secondary | ICD-10-CM

## 2016-12-02 DIAGNOSIS — E039 Hypothyroidism, unspecified: Secondary | ICD-10-CM

## 2016-12-02 LAB — CBC
HEMATOCRIT: 33.2 % — AB (ref 36.0–46.0)
HEMOGLOBIN: 11 g/dL — AB (ref 12.0–15.0)
MCH: 30.7 pg (ref 26.0–34.0)
MCHC: 33.1 g/dL (ref 30.0–36.0)
MCV: 92.7 fL (ref 78.0–100.0)
Platelets: 286 10*3/uL (ref 150–400)
RBC: 3.58 MIL/uL — ABNORMAL LOW (ref 3.87–5.11)
RDW: 13.8 % (ref 11.5–15.5)
WBC: 12.7 10*3/uL — AB (ref 4.0–10.5)

## 2016-12-02 LAB — TSH: TSH: 5.045 u[IU]/mL — ABNORMAL HIGH (ref 0.350–4.500)

## 2016-12-02 LAB — BASIC METABOLIC PANEL
ANION GAP: 8 (ref 5–15)
BUN: 36 mg/dL — AB (ref 6–20)
CALCIUM: 10.2 mg/dL (ref 8.9–10.3)
CO2: 25 mmol/L (ref 22–32)
Chloride: 106 mmol/L (ref 101–111)
Creatinine, Ser: 1.28 mg/dL — ABNORMAL HIGH (ref 0.44–1.00)
GFR calc Af Amer: 42 mL/min — ABNORMAL LOW (ref >60)
GFR, EST NON AFRICAN AMERICAN: 36 mL/min — AB (ref >60)
Glucose, Bld: 172 mg/dL — ABNORMAL HIGH (ref 65–99)
POTASSIUM: 4 mmol/L (ref 3.5–5.1)
SODIUM: 139 mmol/L (ref 135–145)

## 2016-12-02 MED ORDER — ACETAMINOPHEN 500 MG PO TABS
1000.0000 mg | ORAL_TABLET | Freq: Three times a day (TID) | ORAL | Status: DC
Start: 2016-12-02 — End: 2016-12-04
  Administered 2016-12-02 – 2016-12-03 (×5): 1000 mg via ORAL
  Filled 2016-12-02 (×5): qty 2

## 2016-12-02 MED ORDER — OXYCODONE HCL 5 MG PO TABS
5.0000 mg | ORAL_TABLET | Freq: Four times a day (QID) | ORAL | Status: DC | PRN
  Administered 2016-12-02 – 2016-12-03 (×4): 5 mg via ORAL
  Filled 2016-12-02 (×4): qty 1

## 2016-12-02 MED ORDER — MORPHINE SULFATE (PF) 4 MG/ML IV SOLN: 1.0000 mg | INTRAVENOUS | Status: DC | PRN

## 2016-12-02 MED ORDER — POLYETHYLENE GLYCOL 3350 17 G PO PACK
17.0000 g | PACK | Freq: Every day | ORAL | Status: DC
  Administered 2016-12-02 – 2016-12-04 (×2): 17 g via ORAL
  Filled 2016-12-02 (×3): qty 1

## 2016-12-02 MED ORDER — CYCLOBENZAPRINE HCL 5 MG PO TABS
5.0000 mg | ORAL_TABLET | Freq: Three times a day (TID) | ORAL | Status: DC | PRN
  Administered 2016-12-02 – 2016-12-03 (×2): 5 mg via ORAL
  Filled 2016-12-02 (×3): qty 1

## 2016-12-02 MED ORDER — NALOXONE HCL 0.4 MG/ML IJ SOLN: 0.4000 mg | INTRAMUSCULAR | Status: DC | PRN

## 2016-12-02 NOTE — Progress Notes (Signed)
PROGRESS NOTE        PATIENT DETAILS Name: Bailey Duke Age: 83 y.o. Sex: female Date of Birth: 1926/01/24 Admit Date: 12/01/2016 Admitting Physician Jerald Kief, MD RUE:[id number], Phillips Climes, NP  Brief Narrative: Patient is a 83 y.o. female with past medical history of hypertension, dyslipidemia, hypothyroidism presented to ED for worsening neck pain that has been apparently ongoing for the past 1-2 weeks, she was also found to be somewhat confused, further workup revealed UTI/pyelonephritis. See below for further details.  Subjective: Continues to have a significant neck pain-unable to move her neck freely in any direction. Does acknowledge some mild right flank pain yesterday. She is awake and alert this morning.  Assessment/Plan: Acute metabolic encephalopathy: Suspect secondary to UTI, and possibly narcotic/Neurontin use. She is completely awake and alert, I suspect she we could just monitor her closely.  Pyelonephritis: Afebrile, mild leukocytosis present-but does not appear toxic-continue current antimicrobial therapy and await cultures.  Severe neck pain: Unsure what the exact etiology is-she does not have any significant focal neurological deficits, she does have a history of low back pain however. Await MRI-to further delineate pathology. Patient appears very uncomfortable, and is asking to see if we can alleviate her pain-start scheduled Tylenol, will continue use as needed oxycodone for moderate pain, IV morphine for severe pain and as needed muscle relaxants. Both patient and her friends at bedside are aware of side effects of narcotics, but per patient her pain is unbearable-and would like to see if we can alleviate her pain somewhat with narcotics. Discussed with RN, we will cautiously administer narcotics.  Chronic kidney disease stage II: Creatinine close to usual baseline, follow periodically.  Hypertension: Blood pressure fluctuating-slightly on  the higher side-probably due to pain, continue with Coreg and losartan.  Dyslipidemia: Continue statin  Hypothyroidism: Continue with Synthroid. TSH minimally elevated-suspect further adjustment of her Synthroid dosing can be deferred to her outpatient physicians.  OSA: Continue CPAP daily at bedtime if she can tolerate with her neck pain.  DVT Prophylaxis: Prophylactic Lovenox   Code Status: Full code  Family Communication: Friend at bedside  Disposition Plan: Remain inpatient-but will plan on Home health vs SNF on discharge  Antimicrobial agents: Anti-infectives (From admission, onward)   Start     Dose/Rate Route Frequency Ordered Stop   12/02/16 1200  cefTRIAXone (ROCEPHIN) 1 g in dextrose 5 % 50 mL IVPB     1 g 100 mL/hr over 30 Minutes Intravenous Every 24 hours 12/01/16 2132     12/01/16 1300  cefTRIAXone (ROCEPHIN) 1 g in dextrose 5 % 50 mL IVPB     1 g 100 mL/hr over 30 Minutes Intravenous  Once 12/01/16 1258 12/01/16 1410      Procedures: None  CONSULTS:  None  Time spent: 25 minutes-Greater than 50% of this time was spent in counseling, explanation of diagnosis, planning of further management, and coordination of care.  MEDICATIONS: Scheduled Meds: . acetaminophen  1,000 mg Oral Q8H  . aspirin EC  81 mg Oral Daily  . atorvastatin  40 mg Oral Daily  . carvedilol  6.25 mg Oral BID WC  . cholecalciferol  3,000 Units Oral q morning - 10a  . enoxaparin (LOVENOX) injection  40 mg Subcutaneous Q24H  . fluticasone  1 spray Each Nare Daily  . levothyroxine  125 mcg Oral QAC breakfast  .  loratadine  10 mg Oral Daily  . losartan  100 mg Oral q morning - 10a  . magnesium oxide  400 mg Oral QHS  . modafinil  200 mg Oral Daily  . pantoprazole  40 mg Oral Daily  . polyethylene glycol  17 g Oral Daily  . spironolactone  25 mg Oral Daily   Continuous Infusions: . cefTRIAXone (ROCEPHIN)  IV 1 g (12/02/16 1234)   PRN Meds:.camphor-menthol, cyclobenzaprine,  diclofenac sodium, meclizine, morphine injection, naLOXone (NARCAN)  injection, ondansetron **OR** ondansetron (ZOFRAN) IV, oxyCODONE   PHYSICAL EXAM: Vital signs: Vitals:   12/01/16 1800 12/01/16 2040 12/01/16 2128 12/02/16 0430  BP: (!) 132/57 (!) 154/76  (!) 164/60  Pulse: 76 76  74  Resp: (!) 21 18  18   Temp:  98.8 F (37.1 C)  99 F (37.2 C)  TempSrc:  Oral  Axillary  SpO2: 100% 98% 97% 94%  Weight:  85 kg (187 lb 6.3 oz)    Height:  5\' 4"  (1.626 m)     Filed Weights   12/01/16 1051 12/01/16 2040  Weight: 81.6 kg (180 lb) 85 kg (187 lb 6.3 oz)   Body mass index is 32.17 kg/m.   General appearance :Awake, alert, not in any distress. Speech Clear.  Eyes:, pupils equally reactive to light and accomodation,no scleral icterus.Pink conjunctiva HEENT: Atraumatic and Normocephalic Neck: supple, no JVD. No cervical lymphadenopathy. No thyromegaly Resp:Good air entry bilaterally, no added sounds  CVS: S1 S2 regular, no murmurs.  GI: Bowel sounds present, Non tender and not distended with no gaurding, rigidity or rebound.No organomegaly Extremities: B/L Lower Ext shows no edema, both legs are warm to touch Neurology:  speech clear,Non focal, sensation is grossly intact. Psychiatric: Normal judgment and insight. Alert and oriented x 3.  Musculoskeletal:No digital cyanosis Skin:No Rash, warm and dry Wounds:N/A  I have personally reviewed following labs and imaging studies  LABORATORY DATA: CBC: Recent Labs  Lab 12/01/16 1148 12/01/16 2231 12/02/16 0408  WBC 12.0* 12.4* 12.7*  NEUTROABS 9.2*  --   --   HGB 10.1* 10.3* 11.0*  HCT 30.1* 30.9* 33.2*  MCV 92.3 92.2 92.7  PLT 279 289 286    Basic Metabolic Panel: Recent Labs  Lab 12/01/16 1725 12/01/16 2231 12/02/16 0408  NA 135  --  139  K 3.9  --  4.0  CL 102  --  106  CO2 23  --  25  GLUCOSE 175*  --  172*  BUN 39*  --  36*  CREATININE 1.29* 1.27* 1.28*  CALCIUM 10.2  --  10.2    GFR: Estimated Creatinine  Clearance: 31.4 mL/min (A) (by C-G formula based on SCr of 1.28 mg/dL (H)).  Liver Function Tests: Recent Labs  Lab 12/01/16 1725  AST 56*  ALT 43  ALKPHOS 77  BILITOT 0.3  PROT 6.2*  ALBUMIN 3.1*   Recent Labs  Lab 12/01/16 1725  LIPASE 28   Recent Labs  Lab 12/01/16 2231  AMMONIA 10    Coagulation Profile: No results for input(s): INR, PROTIME in the last 168 hours.  Cardiac Enzymes: No results for input(s): CKTOTAL, CKMB, CKMBINDEX, TROPONINI in the last 168 hours.  BNP (last 3 results) No results for input(s): PROBNP in the last 8760 hours.  HbA1C: No results for input(s): HGBA1C in the last 72 hours.  CBG: No results for input(s): GLUCAP in the last 168 hours.  Lipid Profile: No results for input(s): CHOL, HDL, LDLCALC, TRIG, CHOLHDL, LDLDIRECT in  the last 72 hours.  Thyroid Function Tests: Recent Labs    12/02/16 0408  TSH 5.045*    Anemia Panel: No results for input(s): VITAMINB12, FOLATE, FERRITIN, TIBC, IRON, RETICCTPCT in the last 72 hours.  Urine analysis:    Component Value Date/Time   COLORURINE YELLOW 12/01/2016 1225   APPEARANCEUR CLOUDY (A) 12/01/2016 1225   LABSPEC 1.020 12/01/2016 1225   PHURINE 5.5 12/01/2016 1225   GLUCOSEU NEGATIVE 12/01/2016 1225   HGBUR TRACE (A) 12/01/2016 1225   BILIRUBINUR NEGATIVE 12/01/2016 1225   KETONESUR NEGATIVE 12/01/2016 1225   PROTEINUR NEGATIVE 12/01/2016 1225   UROBILINOGEN 0.2 09/15/2014 2345   NITRITE POSITIVE (A) 12/01/2016 1225   LEUKOCYTESUR LARGE (A) 12/01/2016 1225    Sepsis Labs: Lactic Acid, Venous No results found for: LATICACIDVEN  MICROBIOLOGY: No results found for this or any previous visit (from the past 240 hour(s)).  RADIOLOGY STUDIES/RESULTS: Ct Head Wo Contrast  Result Date: 12/01/2016 CLINICAL DATA:  Severe neck pain for 1 week. Altered level consciousness. EXAM: CT HEAD WITHOUT CONTRAST CT CERVICAL SPINE WITHOUT CONTRAST TECHNIQUE: Multidetector CT imaging of the  head and cervical spine was performed following the standard protocol without intravenous contrast. Multiplanar CT image reconstructions of the cervical spine were also generated. COMPARISON:  None. FINDINGS: CT HEAD FINDINGS Brain: No evidence of acute infarction, hemorrhage, extra-axial collection, ventriculomegaly, or mass effect. Generalized cerebral atrophy. Periventricular white matter low attenuation likely secondary to microangiopathy. Vascular: Cerebrovascular atherosclerotic calcifications are noted. Skull: Negative for fracture or focal lesion. Sinuses/Orbits: Visualized portions of the orbits are unremarkable. Visualized portions of the paranasal sinuses and mastoid air cells are unremarkable. Other: None. CT CERVICAL SPINE FINDINGS Alignment: 6 mm anterolisthesis of C4 on C5 secondary to severe bilateral facet arthropathy. Skull base and vertebrae: No acute fracture. No primary bone lesion or focal pathologic process. Soft tissues and spinal canal: No prevertebral fluid or swelling. No visible canal hematoma. Disc levels: Degenerative disc disease with disc height loss at C3-4, C4-5, C5-6 and C6-7. Broad-based disc osteophyte complex at C3-4 with severe bilateral facet arthropathy. At C4-5 there is severe bilateral foraminal stenosis and severe bilateral facet arthropathy. At C5-6 there is a broad-based disc osteophyte complex, bilateral uncovertebral degenerative changes, bilateral foraminal stenosis and bilateral facet arthropathy. At C6-7 there is right uncovertebral degenerative change and right foraminal stenosis. Severe the atlantodental arthropathy. Upper chest: Lung apices are clear. Other: No fluid collection or hematoma. IMPRESSION: 1. No acute intracranial pathology. 2. No acute osseous injury of the cervical spine. Electronically Signed   By: Elige KoHetal  Patel   On: 12/01/2016 11:54   Ct Cervical Spine Wo Contrast  Result Date: 12/01/2016 CLINICAL DATA:  Severe neck pain for 1 week. Altered  level consciousness. EXAM: CT HEAD WITHOUT CONTRAST CT CERVICAL SPINE WITHOUT CONTRAST TECHNIQUE: Multidetector CT imaging of the head and cervical spine was performed following the standard protocol without intravenous contrast. Multiplanar CT image reconstructions of the cervical spine were also generated. COMPARISON:  None. FINDINGS: CT HEAD FINDINGS Brain: No evidence of acute infarction, hemorrhage, extra-axial collection, ventriculomegaly, or mass effect. Generalized cerebral atrophy. Periventricular white matter low attenuation likely secondary to microangiopathy. Vascular: Cerebrovascular atherosclerotic calcifications are noted. Skull: Negative for fracture or focal lesion. Sinuses/Orbits: Visualized portions of the orbits are unremarkable. Visualized portions of the paranasal sinuses and mastoid air cells are unremarkable. Other: None. CT CERVICAL SPINE FINDINGS Alignment: 6 mm anterolisthesis of C4 on C5 secondary to severe bilateral facet arthropathy. Skull base and vertebrae: No acute  fracture. No primary bone lesion or focal pathologic process. Soft tissues and spinal canal: No prevertebral fluid or swelling. No visible canal hematoma. Disc levels: Degenerative disc disease with disc height loss at C3-4, C4-5, C5-6 and C6-7. Broad-based disc osteophyte complex at C3-4 with severe bilateral facet arthropathy. At C4-5 there is severe bilateral foraminal stenosis and severe bilateral facet arthropathy. At C5-6 there is a broad-based disc osteophyte complex, bilateral uncovertebral degenerative changes, bilateral foraminal stenosis and bilateral facet arthropathy. At C6-7 there is right uncovertebral degenerative change and right foraminal stenosis. Severe the atlantodental arthropathy. Upper chest: Lung apices are clear. Other: No fluid collection or hematoma. IMPRESSION: 1. No acute intracranial pathology. 2. No acute osseous injury of the cervical spine. Electronically Signed   By: Elige KoHetal  Patel   On:  12/01/2016 11:54     LOS: 1 day   Jeoffrey MassedShanker Ghimire, MD  [facility] Pager:[phone]  If 7PM-7AM, please contact night-coverage [email or web address] Password [email or web address] 12/02/2016, 1:49 PM

## 2016-12-02 NOTE — Progress Notes (Signed)
Pt states that she will placed on self when ready. Informed pt to notify RT is further assistance is needed.

## 2016-12-03 LAB — URINE CULTURE

## 2016-12-03 MED ORDER — METHYLPREDNISOLONE SODIUM SUCC 125 MG IJ SOLR
60.0000 mg | Freq: Two times a day (BID) | INTRAMUSCULAR | Status: DC
  Administered 2016-12-03 – 2016-12-04 (×3): 60 mg via INTRAVENOUS
  Filled 2016-12-03 (×3): qty 2

## 2016-12-03 MED ORDER — MORPHINE SULFATE (PF) 4 MG/ML IV SOLN: 1.0000 mg | INTRAVENOUS | Status: DC | PRN

## 2016-12-03 MED ORDER — OXYCODONE HCL 5 MG PO TABS: 2.5000 mg | ORAL_TABLET | Freq: Four times a day (QID) | ORAL | Status: DC | PRN

## 2016-12-03 NOTE — Progress Notes (Signed)
PROGRESS NOTE        PATIENT DETAILS Name: Bailey Duke Age: 83 y.o. Sex: female Date of Birth: 1925/10/25 Admit Date: 12/01/2016 Admitting Physician Jerald Kief, MD WUJ:[id number], Phillips Climes, NP  Brief Narrative: Patient is a 83 y.o. female with past medical history of hypertension, dyslipidemia, hypothyroidism presented to ED for worsening neck pain that has been apparently ongoing for the past 1-2 weeks, she was also found to be somewhat confused, further workup revealed UTI/pyelonephritis. See below for further details.  Subjective: Slightly drowsy -I suspect this is secondary to narcotics-continues to have neck pain but seems to be slightly more comfortable than compared to yesterday.  Assessment/Plan: Acute metabolic encephalopathy: Suspect secondary to UTI, and possibly narcotic/Neurontin use. Slightly confused but able to answer most of my questions appropriately. I suspect we just need to minimize narcotics as much as we can-difficult to balance her pain needs. Decrease oxycodone to 2.5 mg. Follow closely.  Pyelonephritis: Afebrile, did have some mild leukocytosis-urine culture positive for Klebsiella-continue ceftriaxone-will aim for a total of at least 7 days of antimicrobial therapy.   Severe neck pain: Having neck pain for the past 2 weeks-PCP apparently started her on some steroids without any much relief. Underwent MRI of the C-spine on 11/8-which showed acute inflammatory changes in the left C2-3 facet with associated spinal muscle strain, also found to have acute tendinitis of the longus coli muscle. Discussed with Dr. Dutch Quint (neurosurgery) over the phone-suggested that we place a cervical collar, and start her on higher dosing of steroids. No indication of infection at this time, but will go ahead and get blood cultures in any event. Start Solu-Medrol, and place a cervical collar as recommended by Dr. Dutch Quint. Continue scheduled Tylenol, decreasing  oxycodone to 2.5 mg daily-if she responds to steroids, hopefully we can start minimizing narcotics as much as possible.   Chronic kidney disease stage II: Creatinine close to usual baseline, follow periodically.  Hypertension: Relatively well controlled, continue Coreg and losartan.   Dyslipidemia: Continue statin  Hypothyroidism: Continue with Synthroid. TSH minimally elevated-suspect further adjustment of her Synthroid dosing can be deferred to her outpatient physicians.  OSA: Continue CPAP daily at bedtime if she can tolerate with her neck pain.  DVT Prophylaxis: Prophylactic Lovenox   Code Status: Full code  Family Communication: None at bedside  Disposition Plan: Remain inpatient-but will plan on Home health vs SNF on discharge  Antimicrobial agents: Anti-infectives (From admission, onward)   Start     Dose/Rate Route Frequency Ordered Stop   12/02/16 1200  cefTRIAXone (ROCEPHIN) 1 g in dextrose 5 % 50 mL IVPB     1 g 100 mL/hr over 30 Minutes Intravenous Every 24 hours 12/01/16 2132     12/01/16 1300  cefTRIAXone (ROCEPHIN) 1 g in dextrose 5 % 50 mL IVPB     1 g 100 mL/hr over 30 Minutes Intravenous  Once 12/01/16 1258 12/01/16 1410      Procedures: None  CONSULTS:  None  Time spent: 25 minutes-Greater than 50% of this time was spent in counseling, explanation of diagnosis, planning of further management, and coordination of care.  MEDICATIONS: Scheduled Meds: . acetaminophen  1,000 mg Oral Q8H  . aspirin EC  81 mg Oral Daily  . atorvastatin  40 mg Oral Daily  . carvedilol  6.25 mg Oral BID WC  . cholecalciferol  3,000 Units Oral q morning - 10a  . enoxaparin (LOVENOX) injection  40 mg Subcutaneous Q24H  . fluticasone  1 spray Each Nare Daily  . levothyroxine  125 mcg Oral QAC breakfast  . loratadine  10 mg Oral Daily  . losartan  100 mg Oral q morning - 10a  . magnesium oxide  400 mg Oral QHS  . methylPREDNISolone (SOLU-MEDROL) injection  60 mg  Intravenous Q12H  . modafinil  200 mg Oral Daily  . pantoprazole  40 mg Oral Daily  . polyethylene glycol  17 g Oral Daily  . spironolactone  25 mg Oral Daily   Continuous Infusions: . cefTRIAXone (ROCEPHIN)  IV 1 g (12/03/16 1157)   PRN Meds:.camphor-menthol, cyclobenzaprine, diclofenac sodium, meclizine, morphine injection, naLOXone (NARCAN)  injection, ondansetron **OR** ondansetron (ZOFRAN) IV, oxyCODONE   PHYSICAL EXAM: Vital signs: Vitals:   12/02/16 0430 12/02/16 1549 12/02/16 2100 12/03/16 0559  BP: (!) 164/60 (!) 145/68 (!) 144/62 137/67  Pulse: 74 72 70 68  Resp: 18 18 16 16   Temp: 99 F (37.2 C) 97.8 F (36.6 C) 98.4 F (36.9 C) 97.9 F (36.6 C)  TempSrc: Axillary Oral Oral Oral  SpO2: 94% 96% 100% 100%  Weight:      Height:       Filed Weights   12/01/16 1051 12/01/16 2040  Weight: 81.6 kg (180 lb) 85 kg (187 lb 6.3 oz)   Body mass index is 32.17 kg/m.   General appearance :Awake,mostly alert, not in any distress.  Eyes:, pupils equally reactive to light and accomodation,no scleral icterus. HEENT: Atraumatic and Normocephalic Neck: supple, no JVD. Resp:Good air entry bilaterally CVS: S1 S2 regular, no murmurs.  GI: Bowel sounds present, Non tender and not distended with no gaurding, rigidity or rebound. Extremities: B/L Lower Ext shows no edema, both legs are warm to touch Neurology:  speech clear,Non focal, sensation is grossly intact. Psychiatric: Normal judgment and insight. Normal mood. Musculoskeletal:No digital cyanosis Skin:No Rash, warm and dry Wounds:N/A  I have personally reviewed following labs and imaging studies  LABORATORY DATA: CBC: Recent Labs  Lab 12/01/16 1148 12/01/16 2231 12/02/16 0408  WBC 12.0* 12.4* 12.7*  NEUTROABS 9.2*  --   --   HGB 10.1* 10.3* 11.0*  HCT 30.1* 30.9* 33.2*  MCV 92.3 92.2 92.7  PLT 279 289 286    Basic Metabolic Panel: Recent Labs  Lab 12/01/16 1725 12/01/16 2231 12/02/16 0408  NA 135  --   139  K 3.9  --  4.0  CL 102  --  106  CO2 23  --  25  GLUCOSE 175*  --  172*  BUN 39*  --  36*  CREATININE 1.29* 1.27* 1.28*  CALCIUM 10.2  --  10.2    GFR: Estimated Creatinine Clearance: 31.4 mL/min (A) (by C-G formula based on SCr of 1.28 mg/dL (H)).  Liver Function Tests: Recent Labs  Lab 12/01/16 1725  AST 56*  ALT 43  ALKPHOS 77  BILITOT 0.3  PROT 6.2*  ALBUMIN 3.1*   Recent Labs  Lab 12/01/16 1725  LIPASE 28   Recent Labs  Lab 12/01/16 2231  AMMONIA 10    Coagulation Profile: No results for input(s): INR, PROTIME in the last 168 hours.  Cardiac Enzymes: No results for input(s): CKTOTAL, CKMB, CKMBINDEX, TROPONINI in the last 168 hours.  BNP (last 3 results) No results for input(s): PROBNP in the last 8760 hours.  HbA1C: No results for input(s): HGBA1C in the last 72  hours.  CBG: No results for input(s): GLUCAP in the last 168 hours.  Lipid Profile: No results for input(s): CHOL, HDL, LDLCALC, TRIG, CHOLHDL, LDLDIRECT in the last 72 hours.  Thyroid Function Tests: Recent Labs    12/02/16 0408  TSH 5.045*    Anemia Panel: No results for input(s): VITAMINB12, FOLATE, FERRITIN, TIBC, IRON, RETICCTPCT in the last 72 hours.  Urine analysis:    Component Value Date/Time   COLORURINE YELLOW 12/01/2016 1225   APPEARANCEUR CLOUDY (A) 12/01/2016 1225   LABSPEC 1.020 12/01/2016 1225   PHURINE 5.5 12/01/2016 1225   GLUCOSEU NEGATIVE 12/01/2016 1225   HGBUR TRACE (A) 12/01/2016 1225   BILIRUBINUR NEGATIVE 12/01/2016 1225   KETONESUR NEGATIVE 12/01/2016 1225   PROTEINUR NEGATIVE 12/01/2016 1225   UROBILINOGEN 0.2 09/15/2014 2345   NITRITE POSITIVE (A) 12/01/2016 1225   LEUKOCYTESUR LARGE (A) 12/01/2016 1225    Sepsis Labs: Lactic Acid, Venous No results found for: LATICACIDVEN  MICROBIOLOGY: Recent Results (from the past 240 hour(s))  Urine culture     Status: Abnormal   Collection Time: 12/01/16 12:25 PM  Result Value Ref Range Status     Specimen Description URINE, RANDOM  Final   Special Requests NONE  Final   Culture >=100,000 COLONIES/mL KLEBSIELLA PNEUMONIAE (A)  Final   Report Status 12/03/2016 FINAL  Final   Organism ID, Bacteria KLEBSIELLA PNEUMONIAE (A)  Final      Susceptibility   Klebsiella pneumoniae - MIC*    AMPICILLIN RESISTANT Resistant     CEFAZOLIN <=4 SENSITIVE Sensitive     CEFTRIAXONE <=1 SENSITIVE Sensitive     CIPROFLOXACIN <=0.25 SENSITIVE Sensitive     GENTAMICIN <=1 SENSITIVE Sensitive     IMIPENEM <=0.25 SENSITIVE Sensitive     NITROFURANTOIN 64 INTERMEDIATE Intermediate     TRIMETH/SULFA <=20 SENSITIVE Sensitive     AMPICILLIN/SULBACTAM 4 SENSITIVE Sensitive     PIP/TAZO <=4 SENSITIVE Sensitive     Extended ESBL NEGATIVE Sensitive     * >=100,000 COLONIES/mL KLEBSIELLA PNEUMONIAE    RADIOLOGY STUDIES/RESULTS: Ct Head Wo Contrast  Result Date: 12/01/2016 CLINICAL DATA:  Severe neck pain for 1 week. Altered level consciousness. EXAM: CT HEAD WITHOUT CONTRAST CT CERVICAL SPINE WITHOUT CONTRAST TECHNIQUE: Multidetector CT imaging of the head and cervical spine was performed following the standard protocol without intravenous contrast. Multiplanar CT image reconstructions of the cervical spine were also generated. COMPARISON:  None. FINDINGS: CT HEAD FINDINGS Brain: No evidence of acute infarction, hemorrhage, extra-axial collection, ventriculomegaly, or mass effect. Generalized cerebral atrophy. Periventricular white matter low attenuation likely secondary to microangiopathy. Vascular: Cerebrovascular atherosclerotic calcifications are noted. Skull: Negative for fracture or focal lesion. Sinuses/Orbits: Visualized portions of the orbits are unremarkable. Visualized portions of the paranasal sinuses and mastoid air cells are unremarkable. Other: None. CT CERVICAL SPINE FINDINGS Alignment: 6 mm anterolisthesis of C4 on C5 secondary to severe bilateral facet arthropathy. Skull base and vertebrae: No  acute fracture. No primary bone lesion or focal pathologic process. Soft tissues and spinal canal: No prevertebral fluid or swelling. No visible canal hematoma. Disc levels: Degenerative disc disease with disc height loss at C3-4, C4-5, C5-6 and C6-7. Broad-based disc osteophyte complex at C3-4 with severe bilateral facet arthropathy. At C4-5 there is severe bilateral foraminal stenosis and severe bilateral facet arthropathy. At C5-6 there is a broad-based disc osteophyte complex, bilateral uncovertebral degenerative changes, bilateral foraminal stenosis and bilateral facet arthropathy. At C6-7 there is right uncovertebral degenerative change and right foraminal stenosis. Severe the atlantodental  arthropathy. Upper chest: Lung apices are clear. Other: No fluid collection or hematoma. IMPRESSION: 1. No acute intracranial pathology. 2. No acute osseous injury of the cervical spine. Electronically Signed   By: Elige KoHetal  Patel   On: 12/01/2016 11:54   Ct Cervical Spine Wo Contrast  Result Date: 12/01/2016 CLINICAL DATA:  Severe neck pain for 1 week. Altered level consciousness. EXAM: CT HEAD WITHOUT CONTRAST CT CERVICAL SPINE WITHOUT CONTRAST TECHNIQUE: Multidetector CT imaging of the head and cervical spine was performed following the standard protocol without intravenous contrast. Multiplanar CT image reconstructions of the cervical spine were also generated. COMPARISON:  None. FINDINGS: CT HEAD FINDINGS Brain: No evidence of acute infarction, hemorrhage, extra-axial collection, ventriculomegaly, or mass effect. Generalized cerebral atrophy. Periventricular white matter low attenuation likely secondary to microangiopathy. Vascular: Cerebrovascular atherosclerotic calcifications are noted. Skull: Negative for fracture or focal lesion. Sinuses/Orbits: Visualized portions of the orbits are unremarkable. Visualized portions of the paranasal sinuses and mastoid air cells are unremarkable. Other: None. CT CERVICAL SPINE  FINDINGS Alignment: 6 mm anterolisthesis of C4 on C5 secondary to severe bilateral facet arthropathy. Skull base and vertebrae: No acute fracture. No primary bone lesion or focal pathologic process. Soft tissues and spinal canal: No prevertebral fluid or swelling. No visible canal hematoma. Disc levels: Degenerative disc disease with disc height loss at C3-4, C4-5, C5-6 and C6-7. Broad-based disc osteophyte complex at C3-4 with severe bilateral facet arthropathy. At C4-5 there is severe bilateral foraminal stenosis and severe bilateral facet arthropathy. At C5-6 there is a broad-based disc osteophyte complex, bilateral uncovertebral degenerative changes, bilateral foraminal stenosis and bilateral facet arthropathy. At C6-7 there is right uncovertebral degenerative change and right foraminal stenosis. Severe the atlantodental arthropathy. Upper chest: Lung apices are clear. Other: No fluid collection or hematoma. IMPRESSION: 1. No acute intracranial pathology. 2. No acute osseous injury of the cervical spine. Electronically Signed   By: Elige KoHetal  Patel   On: 12/01/2016 11:54   Mr Cervical Spine Wo Contrast  Result Date: 12/02/2016 CLINICAL DATA:  Severe neck pain. EXAM: MRI CERVICAL SPINE WITHOUT CONTRAST TECHNIQUE: Multiplanar, multisequence MR imaging of the cervical spine was performed. No intravenous contrast was administered. COMPARISON:  CT cervical spine December 01, 2016 FINDINGS: ALIGNMENT: Straightened cervical lordosis. Grade 1 C4-5 anterolisthesis. Grade 1 C7-T1 anterolisthesis. VERTEBRAE/DISCS: Vertebral bodies are intact. Moderate C3-4, C5-6 and C6-7 disc height loss with mild C3-4 through C5-6 chronic discogenic endplate changes. Moderate C6-7 chronic discogenic endplate changes. Bright STIR signal LEFT C3-4 facets associated with effusion. Enthesopathy longus coli insertion with small amount of pannus posterior to the odontoid process associated with CPPD. CORD:Cervical spinal cord is normal  morphology and signal characteristics from the cervicomedullary junction to level of T1-2, the most caudal well visualized level. POSTERIOR FOSSA, VERTEBRAL ARTERIES, PARASPINAL TISSUES: No MR findings of ligamentous injury. Vertebral artery flow voids present. Prevertebral effusion. Interstitial STIR signal within RIGHT suboccipital soft tissues and to lesser extent mid to lower cervical is paraspinal muscles. No focal fluid collection. DISC LEVELS: C2-3: Small central disc protrusion, uncovertebral hypertrophy. Severe RIGHT grand LEFT facet arthropathy. No canal stenosis. Mild RIGHT neural foraminal narrowing. C3-4: Small broad-based disc bulge, uncovertebral hypertrophy and moderate RIGHT, severe LEFT facet arthropathy. Mild canal stenosis. Moderate to severe bilateral neural foraminal narrowing. C4-5: Anterolisthesis. Unroofing of the disc. Severe facet arthropathy, uncovertebral hypertrophy. Moderate canal stenosis. Severe bilateral neural foraminal narrowing. C5-6: Uncovertebral hypertrophy mild facet arthropathy. Mild canal stenosis. Moderate bilateral neural foraminal narrowing. C6-7: Small broad-based disc bulge, uncovertebral  hypertrophy and mild facet arthropathy. Mild canal stenosis. Severe bilateral neural foraminal narrowing. C7-T1: Anterolisthesis. No disc bulge. Severe facet arthropathy without canal stenosis or neural foraminal narrowing. IMPRESSION: 1. No acute fracture. Grade 1 C4-5 and grade 1 C7-T1 anterolisthesis on degenerative basis. 2. Multilevel severe facet arthropathy with acute inflammatory changes LEFT C2-3 facet. Associated paraspinal muscle strain, less likely myositis. 3. Image findings of acute tendinitis of the longus coli muscle. 4. Moderate canal stenosis C4-5, mild at C3-4, C5-6 and C6-7. 5. Multilevel neural foraminal narrowing:  Severe at C4-5 and C6-7. Electronically Signed   By: Awilda Metro M.D.   On: 12/02/2016 17:34     LOS: 2 days   Jeoffrey Massed, MD  [facility] Pager:[phone]  If 7PM-7AM, please contact night-coverage [email or web address] Password [email or web address] 12/03/2016, 12:30 PM

## 2016-12-03 NOTE — Progress Notes (Signed)
Pt is active with [facility] for home health orders. PT eval request made to MD. Will need resumption orders for [facility]. Sandford Crazeora Ludella Pranger RN,BSN,NCM [phone]

## 2016-12-03 NOTE — Progress Notes (Signed)
Patient is from [facility]. CSW will follow to assist with d/c planning if higher level of care is recommended by PT.  PT ordered.     Vivi BarrackNicole Astraea Gaughran, Theresia MajorsLCSWA, MSW Clinical Social Worker  [phone] 12/03/2016  3:35 PM

## 2016-12-04 LAB — CBC
HEMATOCRIT: 32.1 % — AB (ref 36.0–46.0)
Hemoglobin: 11 g/dL — ABNORMAL LOW (ref 12.0–15.0)
MCH: 31.3 pg (ref 26.0–34.0)
MCHC: 34.3 g/dL (ref 30.0–36.0)
MCV: 91.5 fL (ref 78.0–100.0)
PLATELETS: 338 10*3/uL (ref 150–400)
RBC: 3.51 MIL/uL — ABNORMAL LOW (ref 3.87–5.11)
RDW: 13.5 % (ref 11.5–15.5)
WBC: 14.1 10*3/uL — AB (ref 4.0–10.5)

## 2016-12-04 LAB — BASIC METABOLIC PANEL
ANION GAP: 9 (ref 5–15)
BUN: 26 mg/dL — ABNORMAL HIGH (ref 6–20)
CALCIUM: 10.3 mg/dL (ref 8.9–10.3)
CO2: 24 mmol/L (ref 22–32)
Chloride: 103 mmol/L (ref 101–111)
Creatinine, Ser: 1.22 mg/dL — ABNORMAL HIGH (ref 0.44–1.00)
GFR calc Af Amer: 44 mL/min — ABNORMAL LOW (ref >60)
GFR, EST NON AFRICAN AMERICAN: 38 mL/min — AB (ref >60)
GLUCOSE: 169 mg/dL — AB (ref 65–99)
Potassium: 4.8 mmol/L (ref 3.5–5.1)
Sodium: 136 mmol/L (ref 135–145)

## 2016-12-04 MED ORDER — PREDNISONE 5 MG PO TABS: ORAL_TABLET | ORAL | 0 refills | Status: DC

## 2016-12-04 MED ORDER — CYCLOBENZAPRINE HCL 5 MG PO TABS: 5.0000 mg | ORAL_TABLET | Freq: Three times a day (TID) | ORAL | 0 refills | Status: DC | PRN

## 2016-12-04 MED ORDER — DICLOFENAC SODIUM 1 % TD GEL: 2.0000 g | Freq: Three times a day (TID) | TRANSDERMAL | 0 refills | Status: DC | PRN

## 2016-12-04 MED ORDER — METHYLPREDNISOLONE 4 MG PO TBPK: ORAL_TABLET | ORAL | 0 refills | Status: DC

## 2016-12-04 MED ORDER — CEFPODOXIME PROXETIL 200 MG PO TABS: 200.0000 mg | ORAL_TABLET | Freq: Two times a day (BID) | ORAL | 0 refills | Status: DC

## 2016-12-04 NOTE — Progress Notes (Signed)
Pt discharged to home with [facility] at [facility] with caregiver, Alyssa. Discharge instructions and scripts given. Pt and caregiver verbalized understanding. No immediate questions or concerns at this time.

## 2016-12-04 NOTE — Discharge Summary (Signed)
Bailey CarlsGrace Duke WGN:[id number] DOB: 07/27/1925 DOA: 12/01/2016  PCP: Bill Salinasurbyfill, Holly I, NP  Admit date: 12/01/2016  Discharge date: 12/04/2016  Admitted From: ALF   Disposition:  ALF   Recommendations for Outpatient Follow-up:   Follow up with PCP in 1-2 weeks  PCP Please obtain BMP/CBC, 2 view CXR in 1week,  (see Discharge instructions)   PCP Please follow up on the following pending results: None   Home Health: PT,RN,Aide   Equipment/Devices:  None Consultations: None Discharge Condition: Fair   CODE STATUS: Full   Diet Recommendation:  Heart Healthy    Chief Complaint  Patient presents with  . Neck Pain     Brief history of present illness from the day of admission and additional interim summary    Patient is a 83 y.o. female with past medical history of hypertension, dyslipidemia, hypothyroidism presented to ED for worsening neck pain that has been apparently ongoing for the past 1-2 weeks, she was also found to be somewhat confused, further workup revealed UTI/pyelonephritis. See below for further details.                                                                 Hospital Course    Acute metabolic/toxic encephalopathy: Suspect secondary to UTI, and possibly narcotic/Neurontin use.   Infection was treated and offending medications were reduced to good effect, mentation back to baseline will be discharged to ALF.  Pyelonephritis:  Klebsiella- sensitive to ceftriaxone-clinically stable, non toxic, 1 more week of PO Vantin.   Severe neck pain:  MRI of the C-spine on 11/8-which showed acute inflammatory changes in the left C2-3 facet with associated spinal muscle strain, also found to have acute tendinitis of the longus coli muscle.  Previous MD discussed with Dr. Dutch QuintPoole (neurosurgery) over the  phone-suggested that we place a cervical collar, and start her on higher dosing of steroids. No indication of infection at this time, cultures this admission negative, she was placed on steroids along with NSAID cream with good effect, relatively symptom-free this morning, will place her on oral steroid taper, continue topical NSAID cream with outpatient follow-up with Dr Dutch QuintPoole.   Chronic kidney disease stage II: Creatinine close to usual baseline, follow periodically.  Hypertension: Relatively well controlled, continue Coreg and losartan.   Dyslipidemia: Continue statin  Hypothyroidism: Continue with Synthroid. TSH minimally elevated-suspect further adjustment of her Synthroid dosing can be deferred to her outpatient physicians.  OSA: Continue CPAP daily at bedtime if she can tolerate with her neck pain.      Discharge diagnosis     Principal Problem:   Metabolic encephalopathy Active Problems:   Obstructive sleep apnea   Hypothyroid   Hypertension   Dementia   Acute lower UTI   Acute encephalopathy   Lower urinary tract infection  Discharge instructions      Discharge Medications   Allergies as of 12/04/2016      Reactions   Tetanus Toxoids Other (See Comments)   RED LINE FROM INJECTION SITE DOWN ARM   Penicillins Other (See Comments)   1940 FORMS - TINY ITCHY PIMPLES ON FINGERS   Procaine Other (See Comments)   ?     Sulfa Antibiotics       Medication List    TAKE these medications   acetaminophen 325 MG tablet Commonly known as:  TYLENOL Take 650 mg by mouth every 4 (four) hours as needed for mild pain or fever.   aspirin EC 81 MG tablet Take 81 mg by mouth daily.   atorvastatin 40 MG tablet Commonly known as:  LIPITOR Take 40 mg daily by mouth.   BIOFREEZE 4 % Gel Generic drug:  Menthol (Topical Analgesic) Apply 1 application topically 3 (three) times daily as needed (for joint pain).   camphor-menthol lotion Commonly known as:   SARNA Apply 1 application topically 2 (two) times daily as needed for itching.   carvedilol 6.25 MG tablet Commonly known as:  COREG Take 6.25 mg by mouth 2 (two) times daily with a meal.   cefpodoxime 200 MG tablet Commonly known as:  VANTIN Take 1 tablet (200 mg total) 2 (two) times daily by mouth.   cetirizine 10 MG tablet Commonly known as:  ZYRTEC Take 10 mg by mouth daily as needed for allergies or rhinitis.   cyclobenzaprine 5 MG tablet Commonly known as:  FLEXERIL Take 1 tablet (5 mg total) 3 (three) times daily as needed by mouth for muscle spasms.   diclofenac sodium 1 % Gel Commonly known as:  VOLTAREN Apply 4 g topically every 6 (six) hours as needed (for lumbosacral spondylosis). What changed:  Another medication with the same name was added. Make sure you understand how and when to take each.   diclofenac sodium 1 % Gel Commonly known as:  VOLTAREN Apply 2 g 3 (three) times daily as needed topically (pain). What changed:  You were already taking a medication with the same name, and this prescription was added. Make sure you understand how and when to take each.   FLONASE 50 MCG/ACT nasal spray Generic drug:  fluticasone Place 1 spray into both nostrils daily.   levothyroxine 125 MCG tablet Commonly known as:  SYNTHROID, LEVOTHROID Take 125 mcg daily before breakfast by mouth.   losartan 100 MG tablet Commonly known as:  COZAAR Take 100 mg by mouth every morning.   magnesium oxide 400 MG tablet Commonly known as:  MAG-OX Take 400 mg by mouth at bedtime.   meclizine 25 MG tablet Commonly known as:  ANTIVERT Take 25 mg by mouth 3 (three) times daily as needed for dizziness.   modafinil 200 MG tablet Commonly known as:  PROVIGIL Take 200 mg by mouth daily.   MULTIVITAMIN PO Take 1 tablet by mouth every morning.   NEURONTIN 300 MG capsule Generic drug:  gabapentin Take 1 capsule in the morning and 2 capsules in the evening.   omeprazole 20 MG  capsule Commonly known as:  PRILOSEC Take 20 mg by mouth daily.   oxybutynin 5 MG 24 hr tablet Commonly known as:  DITROPAN-XL Take 5 mg 2 (two) times daily by mouth.   PENETRAN PLUS EX Apply 1 application topically 3 (three) times daily as needed (for joint pain).   predniSONE 5 MG tablet Commonly known as:  DELTASONE Label  &  dispense according to the schedule below. 10 Pills PO for 3 days then, 8 Pills PO for 3 days, 6 Pills PO for 3 days, 4 Pills PO for 3 days, 2 Pills PO for 3 days, 1 Pills PO for 3 days, 1/2 Pill  PO for 3 days then STOP. Total 95 pills.   RA KRILL OIL 500 MG Caps Take 1 capsule by mouth daily. Reported on 04/08/2015   RA TURMERIC 500 MG Caps Generic drug:  Turmeric Take 500 mg by mouth daily. Reported on 04/08/2015   spironolactone 25 MG tablet Commonly known as:  ALDACTONE Take 25 mg by mouth daily.   torsemide 20 MG tablet Commonly known as:  DEMADEX Take 20 mg daily by mouth.   traMADol 50 MG tablet Commonly known as:  ULTRAM Take 50 mg by mouth every 6 (six) hours as needed for moderate pain.   vitamin C 500 MG tablet Commonly known as:  ASCORBIC ACID Take 1,000 mg by mouth every morning. Reported on 04/08/2015   Vitamin D 2000 units Caps Take 3,000 Units every morning by mouth.       Follow-up Information    Bill Salinas I, NP. Schedule an appointment as soon as possible for a visit in 1 week(s).   Specialty:  Nurse Practitioner Contact information: [address] Suan [place] [address] [phone]        Julio Sicks, MD. Schedule an appointment as soon as possible for a visit in 1 week(s).   Specialty:  Neurosurgery Contact information: [address] [place] [address] [phone]           Major procedures and Radiology Reports - PLEASE review detailed and final reports thoroughly  -         Ct Head Wo Contrast  Result Date: 12/01/2016 CLINICAL DATA:  Severe neck pain for 1 week. Altered  level consciousness. EXAM: CT HEAD WITHOUT CONTRAST CT CERVICAL SPINE WITHOUT CONTRAST TECHNIQUE: Multidetector CT imaging of the head and cervical spine was performed following the standard protocol without intravenous contrast. Multiplanar CT image reconstructions of the cervical spine were also generated. COMPARISON:  None. FINDINGS: CT HEAD FINDINGS Brain: No evidence of acute infarction, hemorrhage, extra-axial collection, ventriculomegaly, or mass effect. Generalized cerebral atrophy. Periventricular white matter low attenuation likely secondary to microangiopathy. Vascular: Cerebrovascular atherosclerotic calcifications are noted. Skull: Negative for fracture or focal lesion. Sinuses/Orbits: Visualized portions of the orbits are unremarkable. Visualized portions of the paranasal sinuses and mastoid air cells are unremarkable. Other: None. CT CERVICAL SPINE FINDINGS Alignment: 6 mm anterolisthesis of C4 on C5 secondary to severe bilateral facet arthropathy. Skull base and vertebrae: No acute fracture. No primary bone lesion or focal pathologic process. Soft tissues and spinal canal: No prevertebral fluid or swelling. No visible canal hematoma. Disc levels: Degenerative disc disease with disc height loss at C3-4, C4-5, C5-6 and C6-7. Broad-based disc osteophyte complex at C3-4 with severe bilateral facet arthropathy. At C4-5 there is severe bilateral foraminal stenosis and severe bilateral facet arthropathy. At C5-6 there is a broad-based disc osteophyte complex, bilateral uncovertebral degenerative changes, bilateral foraminal stenosis and bilateral facet arthropathy. At C6-7 there is right uncovertebral degenerative change and right foraminal stenosis. Severe the atlantodental arthropathy. Upper chest: Lung apices are clear. Other: No fluid collection or hematoma. IMPRESSION: 1. No acute intracranial pathology. 2. No acute osseous injury of the cervical spine. Electronically Signed   By: Elige Ko   On:  12/01/2016 11:54   Ct Cervical  Spine Wo Contrast  Result Date: 12/01/2016 CLINICAL DATA:  Severe neck pain for 1 week. Altered level consciousness. EXAM: CT HEAD WITHOUT CONTRAST CT CERVICAL SPINE WITHOUT CONTRAST TECHNIQUE: Multidetector CT imaging of the head and cervical spine was performed following the standard protocol without intravenous contrast. Multiplanar CT image reconstructions of the cervical spine were also generated. COMPARISON:  None. FINDINGS: CT HEAD FINDINGS Brain: No evidence of acute infarction, hemorrhage, extra-axial collection, ventriculomegaly, or mass effect. Generalized cerebral atrophy. Periventricular white matter low attenuation likely secondary to microangiopathy. Vascular: Cerebrovascular atherosclerotic calcifications are noted. Skull: Negative for fracture or focal lesion. Sinuses/Orbits: Visualized portions of the orbits are unremarkable. Visualized portions of the paranasal sinuses and mastoid air cells are unremarkable. Other: None. CT CERVICAL SPINE FINDINGS Alignment: 6 mm anterolisthesis of C4 on C5 secondary to severe bilateral facet arthropathy. Skull base and vertebrae: No acute fracture. No primary bone lesion or focal pathologic process. Soft tissues and spinal canal: No prevertebral fluid or swelling. No visible canal hematoma. Disc levels: Degenerative disc disease with disc height loss at C3-4, C4-5, C5-6 and C6-7. Broad-based disc osteophyte complex at C3-4 with severe bilateral facet arthropathy. At C4-5 there is severe bilateral foraminal stenosis and severe bilateral facet arthropathy. At C5-6 there is a broad-based disc osteophyte complex, bilateral uncovertebral degenerative changes, bilateral foraminal stenosis and bilateral facet arthropathy. At C6-7 there is right uncovertebral degenerative change and right foraminal stenosis. Severe the atlantodental arthropathy. Upper chest: Lung apices are clear. Other: No fluid collection or hematoma. IMPRESSION: 1.  No acute intracranial pathology. 2. No acute osseous injury of the cervical spine. Electronically Signed   By: Elige Ko   On: 12/01/2016 11:54   Mr Cervical Spine Wo Contrast  Result Date: 12/02/2016 CLINICAL DATA:  Severe neck pain. EXAM: MRI CERVICAL SPINE WITHOUT CONTRAST TECHNIQUE: Multiplanar, multisequence MR imaging of the cervical spine was performed. No intravenous contrast was administered. COMPARISON:  CT cervical spine December 01, 2016 FINDINGS: ALIGNMENT: Straightened cervical lordosis. Grade 1 C4-5 anterolisthesis. Grade 1 C7-T1 anterolisthesis. VERTEBRAE/DISCS: Vertebral bodies are intact. Moderate C3-4, C5-6 and C6-7 disc height loss with mild C3-4 through C5-6 chronic discogenic endplate changes. Moderate C6-7 chronic discogenic endplate changes. Bright STIR signal LEFT C3-4 facets associated with effusion. Enthesopathy longus coli insertion with small amount of pannus posterior to the odontoid process associated with CPPD. CORD:Cervical spinal cord is normal morphology and signal characteristics from the cervicomedullary junction to level of T1-2, the most caudal well visualized level. POSTERIOR FOSSA, VERTEBRAL ARTERIES, PARASPINAL TISSUES: No MR findings of ligamentous injury. Vertebral artery flow voids present. Prevertebral effusion. Interstitial STIR signal within RIGHT suboccipital soft tissues and to lesser extent mid to lower cervical is paraspinal muscles. No focal fluid collection. DISC LEVELS: C2-3: Small central disc protrusion, uncovertebral hypertrophy. Severe RIGHT grand LEFT facet arthropathy. No canal stenosis. Mild RIGHT neural foraminal narrowing. C3-4: Small broad-based disc bulge, uncovertebral hypertrophy and moderate RIGHT, severe LEFT facet arthropathy. Mild canal stenosis. Moderate to severe bilateral neural foraminal narrowing. C4-5: Anterolisthesis. Unroofing of the disc. Severe facet arthropathy, uncovertebral hypertrophy. Moderate canal stenosis. Severe  bilateral neural foraminal narrowing. C5-6: Uncovertebral hypertrophy mild facet arthropathy. Mild canal stenosis. Moderate bilateral neural foraminal narrowing. C6-7: Small broad-based disc bulge, uncovertebral hypertrophy and mild facet arthropathy. Mild canal stenosis. Severe bilateral neural foraminal narrowing. C7-T1: Anterolisthesis. No disc bulge. Severe facet arthropathy without canal stenosis or neural foraminal narrowing. IMPRESSION: 1. No acute fracture. Grade 1 C4-5 and grade 1 C7-T1 anterolisthesis on degenerative basis. 2.  Multilevel severe facet arthropathy with acute inflammatory changes LEFT C2-3 facet. Associated paraspinal muscle strain, less likely myositis. 3. Image findings of acute tendinitis of the longus coli muscle. 4. Moderate canal stenosis C4-5, mild at C3-4, C5-6 and C6-7. 5. Multilevel neural foraminal narrowing:  Severe at C4-5 and C6-7. Electronically Signed   By: Awilda Metroourtnay  Bloomer M.D.   On: 12/02/2016 17:34    Micro Results     Recent Results (from the past 240 hour(s))  Urine culture     Status: Abnormal   Collection Time: 12/01/16 12:25 PM  Result Value Ref Range Status   Specimen Description URINE, RANDOM  Final   Special Requests NONE  Final   Culture >=100,000 COLONIES/mL KLEBSIELLA PNEUMONIAE (A)  Final   Report Status 12/03/2016 FINAL  Final   Organism ID, Bacteria KLEBSIELLA PNEUMONIAE (A)  Final      Susceptibility   Klebsiella pneumoniae - MIC*    AMPICILLIN RESISTANT Resistant     CEFAZOLIN <=4 SENSITIVE Sensitive     CEFTRIAXONE <=1 SENSITIVE Sensitive     CIPROFLOXACIN <=0.25 SENSITIVE Sensitive     GENTAMICIN <=1 SENSITIVE Sensitive     IMIPENEM <=0.25 SENSITIVE Sensitive     NITROFURANTOIN 64 INTERMEDIATE Intermediate     TRIMETH/SULFA <=20 SENSITIVE Sensitive     AMPICILLIN/SULBACTAM 4 SENSITIVE Sensitive     PIP/TAZO <=4 SENSITIVE Sensitive     Extended ESBL NEGATIVE Sensitive     * >=100,000 COLONIES/mL KLEBSIELLA PNEUMONIAE    Today    Subjective    Bailey CarlsGrace Ramus today has no headache,no chest abdominal pain,no new weakness tingling or numbness, feels much better wants to go home today.     Objective   Blood pressure 131/67, pulse 81, temperature 98 F (36.7 C), temperature source Oral, resp. rate 16, height 5\' 4"  (1.626 m), weight 85 kg (187 lb 6.3 oz), SpO2 99 %.   Intake/Output Summary (Last 24 hours) at 12/04/2016 1052 Last data filed at 12/04/2016 0600 Gross per 24 hour  Intake 695 ml  Output 400 ml  Net 295 ml    Exam Awake Alert, No new F.N deficits, Normal affect [place].AT,PERRAL Supple Neck,No JVD, No cervical lymphadenopathy appriciated.  Symmetrical Chest wall movement, Good air movement bilaterally, CTAB RRR,No Gallops,Rubs or new Murmurs, No Parasternal Heave +ve B.Sounds, Abd Soft, Non tender, No organomegaly appriciated, No rebound -guarding or rigidity. No Cyanosis, Clubbing or edema, No new Rash or bruise   Data Review   CBC w Diff:  Lab Results  Component Value Date   WBC 14.1 (H) 12/04/2016   HGB 11.0 (L) 12/04/2016   HCT 32.1 (L) 12/04/2016   PLT 338 12/04/2016   LYMPHOPCT 15 12/01/2016   MONOPCT 8 12/01/2016   EOSPCT 0 12/01/2016   BASOPCT 0 12/01/2016    CMP:  Lab Results  Component Value Date   NA 136 12/04/2016   K 4.8 12/04/2016   CL 103 12/04/2016   CO2 24 12/04/2016   BUN 26 (H) 12/04/2016   CREATININE 1.22 (H) 12/04/2016   PROT 6.2 (L) 12/01/2016   ALBUMIN 3.1 (L) 12/01/2016   BILITOT 0.3 12/01/2016   ALKPHOS 77 12/01/2016   AST 56 (H) 12/01/2016   ALT 43 12/01/2016  .   Total Time in preparing paper work, data evaluation and todays exam - 35 minutes  Susa RaringPrashant Kielan Dreisbach M.D on 12/04/2016 at 10:52 AM  [facility]   Office  [phone]

## 2016-12-04 NOTE — Evaluation (Signed)
Physical Therapy Evaluation Patient Details Name: Bailey Duke MRN: [id number] DOB: 09/02/1925 Today's Date: 12/04/2016   History of Present Illness  Patient is a 83 y.o. female with past medical history of hypertension, dyslipidemia, hypothyroidism presented to ED for worsening neck pain that has been apparently ongoing for the past 1-2 weeks, she was also found to be somewhat confused, further workup revealed UTI/pyelonephritis.  Clinical Impression  Pt admitted with above diagnosis. Pt currently with functional limitations due to the deficits listed below (see PT Problem List).  Pt will benefit from skilled PT to increase their independence and safety with mobility to allow discharge to the venue listed below.  Pt was receiving HHPT and recommend continuing when pt returns to her ILF.     Follow Up Recommendations Home health PT    Equipment Recommendations  None recommended by PT    Recommendations for Other Services       Precautions / Restrictions Precautions Precautions: Cervical Precaution Comments: Pt declines to wear cervical brace at this time due to stating " it is too hot and too confining".  reviewed MD recommendation Restrictions Weight Bearing Restrictions: No      Mobility  Bed Mobility               General bed mobility comments: pt up in chair upon arrival  Transfers Overall transfer level: Needs assistance Equipment used: Rolling walker (2 wheeled);4-wheeled walker Transfers: Sit to/from Stand Sit to Stand: Supervision            Ambulation/Gait Ambulation/Gait assistance: Min guard Ambulation Distance (Feet): 120 Feet Assistive device: Rolling walker (2 wheeled);4-wheeled walker Gait Pattern/deviations: Decreased step length - right;Decreased stance time - left;Shuffle     General Gait Details: Pt ambulated 20' with RW and then retrieved rollator and pt ambulated 120' with cues for step length to decrease shuffle  Stairs             Wheelchair Mobility    Modified Rankin (Stroke Patients Only)       Balance                                             Pertinent Vitals/Pain Pain Assessment: 0-10 Pain Score: 2  Pain Location: L cervical region Pain Descriptors / Indicators: Grimacing;Guarding Pain Intervention(s): Limited activity within patient's tolerance;Monitored during session;Other (comment)(stretching)    Home Living Family/patient expects to be discharged to:: Private residence Living Arrangements: Alone Available Help at Discharge: Available PRN/intermittently;Personal care attendant(3 days/ week 5 hrs /day, states they are her assistants and do mostly clerical work for her) Type of Home: Independent living facility Home Access: Level entry     Home Layout: One level Home Equipment: Environmental consultantWalker - 4 wheels;Shower seat;Hospital bed;Bedside commode;Cane - single point Additional Comments[facility]    Prior Function Level of Independence: Independent with assistive device(s)         Comments: [place] with rollator at [place]. Ambulates to dining room daily. Pt was getting HHPT from [facility].     Hand Dominance   Dominant Hand: Right    Extremity/Trunk Assessment   Upper Extremity Assessment Upper Extremity Assessment: Generalized weakness    Lower Extremity Assessment Lower Extremity Assessment: Overall WFL for tasks assessed;LLE deficits/detail LLE Deficits / Details: crepitus    Cervical / Trunk Assessment Cervical / Trunk Assessment: Other exceptions Cervical / Trunk Exceptions:  L rotation only to neutral due to pain  Communication   Communication: No difficulties  Cognition Arousal/Alertness: Awake/alert Behavior During Therapy: WFL for tasks assessed/performed Overall Cognitive Status: Impaired/Different from baseline                                 General Comments: Pt with tangiential conversation at times.  Does feel she is not  back to her normal baseline. She is oriented.      General Comments      Exercises     Assessment/Plan    PT Assessment Patient needs continued PT services  PT Problem List Decreased strength;Decreased activity tolerance;Decreased balance;Decreased mobility       PT Treatment Interventions Gait training;DME instruction;Therapeutic activities;Therapeutic exercise;Balance training    PT Goals (Current goals can be found in the Care Plan section)  Acute Rehab PT Goals Patient Stated Goal: go home as soon as she can PT Goal Formulation: With patient Time For Goal Achievement: 12/18/16 Potential to Achieve Goals: Good    Frequency Min 3X/week   Barriers to discharge        Co-evaluation               AM-PAC PT "6 Clicks" Daily Activity  Outcome Measure Difficulty turning over in bed (including adjusting bedclothes, sheets and blankets)?: A Little Difficulty moving from lying on back to sitting on the side of the bed? : A Little Difficulty sitting down on and standing up from a chair with arms (e.g., wheelchair, bedside commode, etc,.)?: A Little Help needed moving to and from a bed to chair (including a wheelchair)?: A Little Help needed walking in hospital room?: A Little Help needed climbing 3-5 steps with a railing? : A Lot 6 Click Score: 17    End of Session Equipment Utilized During Treatment: Gait belt Activity Tolerance: Patient tolerated treatment well Patient left: in chair;with chair alarm set;with call bell/phone within reach Nurse Communication: Mobility status PT Visit Diagnosis: Unsteadiness on feet (R26.81);Muscle weakness (generalized) (M62.81);Difficulty in walking, not elsewhere classified (R26.2)    Time: [id number] PT Time Calculation (min) (ACUTE ONLY): 48 min   Charges:   PT Evaluation $PT Eval Moderate Complexity: 1 Mod PT Treatments $Gait Training: 8-22 mins $Therapeutic Activity: 8-22 mins   PT G Codes:        Freddrick Gladson L. Katrinka BlazingSmith,  [place]PT Pager [phone] 12/04/2016   Enzo MontgomeryKaren L Tegan Britain 12/04/2016, 1:00 PM

## 2016-12-04 NOTE — Progress Notes (Signed)
LCSW made aware that PT has recommended home health at facility. LCSW updated CM regarding recommendations and she will be facilitating orders to facility.  Call placed: personal caregiver as requested by RN  [phone]. LCSW explained plans with caregiver as recommended  By PT. Caregiver will be coming to pick patient up around 5pm.  LCSW notified RN and she is in agreement.  Bailey EmoryHannah Jovian Lembcke LCSW, MSW Clinical Social Work: Optician, dispensingystem Wide Float Coverage for :  [phone]

## 2016-12-04 NOTE — Care Management Important Message (Signed)
Important Message  Patient Details  Name: Macario CarlsGrace Mccaskey MRN: [id number] Date of Birth: 12/18/1925   Medicare Important Message Given:  Yes    Elliot CousinShavis, Sanyia Dini Ellen, RN 12/04/2016, 4:07 PM

## 2016-12-04 NOTE — Progress Notes (Signed)
Called to room to place patient on CPAP machine, however, patient has machine from home along with tubing and mask etc.  Talked to patient and she stated that she places herself on CPAP at home without assistance.  Educated patient that we are not allowed to turn on patient machine or manipulate in any fashion.  I would be glad to help with her mask but that would be all the assistance I could provide.  Pt. Assisted with mask as requested and she turned on machine by herself.  She then asked me to put on her lidocaine patches and I declined and referred to the nurse in charge of her care.

## 2016-12-04 NOTE — Care Management Note (Signed)
Case Management Note  Patient Details  Name: Macario CarlsGrace Varas MRN: [id number] Date of Birth: 09/28/1925  Subjective/Objective:    Acute metabolic/toxic encephalopathy, pyelonephritis, severe neck pain                Action/Plan: Discharge Planning: NCM spoke to pt and [facility] Contacted [facility] to make aware of pt's scheduled dc home today to her apt at [place]. Pt has RW at home.   PCP TURBYFILL, Jeanice LimHOLLY I NP  Expected Discharge Date: 12/04/2016             Expected Discharge Plan:  Home w Home Health Services  In-House Referral:  NA  Discharge planning Services  CM Consult  Post Acute Care Choice:  Home Health, Resumption of Svcs/PTA Provider Choice offered to:  Patient  DME Arranged:  N/A DME Agency:  NA  HH Arranged:  RN, PT, Nurse's Aide HH Agency:  [facility] ([facility]  Status of Service:  Completed, signed off  If discussed at [employer] Meetings, dates discussed:    Additional Comments:  Elliot CousinShavis, Keliah Harned Ellen, RN 12/04/2016, 4:11 PM

## 2016-12-04 NOTE — Discharge Instructions (Signed)
Follow with Primary MD Bill Salinasurbyfill, Holly I, NP in 7 days   Get CBC, CMP, 2 view Chest X ray checked  by Primary MD or SNF MD in 5-7 days ( we routinely change or add medications that can affect your baseline labs and fluid status, therefore we recommend that you get the mentioned basic workup next visit with your PCP, your PCP may decide not to get them or add new tests based on their clinical decision)  Activity: As tolerated with Full fall precautions use walker/cane & assistance as needed  Disposition ALF  Diet:    Heart Healthy   For Heart failure patients - Check your Weight same time everyday, if you gain over 2 pounds, or you develop in leg swelling, experience more shortness of breath or chest pain, call your Primary MD immediately. Follow Cardiac Low Salt Diet and 1.5 lit/day fluid restriction.  On your next visit with your primary care physician please Get Medicines reviewed and adjusted.  Please request your Prim.MD to go over all Hospital Tests and Procedure/Radiological results at the follow up, please get all Hospital records sent to your Prim MD by signing hospital release before you go home.  If you experience worsening of your admission symptoms, develop shortness of breath, life threatening emergency, suicidal or homicidal thoughts you must seek medical attention immediately by calling 911 or calling your MD immediately  if symptoms less severe.  You Must read complete instructions/literature along with all the possible adverse reactions/side effects for all the Medicines you take and that have been prescribed to you. Take any new Medicines after you have completely understood and accpet all the possible adverse reactions/side effects.   Do not drive, operate heavy machinery, perform activities at heights, swimming or participation in water activities or provide baby sitting services if your were admitted for syncope or siezures until you have seen by Primary MD or a Neurologist  and advised to do so again.  Do not drive when taking Pain medications.    Do not take more than prescribed Pain, Sleep and Anxiety Medications  Special Instructions: If you have smoked or chewed Tobacco  in the last 2 yrs please stop smoking, stop any regular Alcohol  and or any Recreational drug use.  Wear Seat belts while driving.   Please note  You were cared for by a hospitalist during your hospital stay. If you have any questions about your discharge medications or the care you received while you were in the hospital after you are discharged, you can call the unit and asked to speak with the hospitalist on call if the hospitalist that took care of you is not available. Once you are discharged, your primary care physician will handle any further medical issues. Please note that NO REFILLS for any discharge medications will be authorized once you are discharged, as it is imperative that you return to your primary care physician (or establish a relationship with a primary care physician if you do not have one) for your aftercare needs so that they can reassess your need for medications and monitor your lab values.

## 2016-12-08 ENCOUNTER — Encounter (INDEPENDENT_AMBULATORY_CARE_PROVIDER_SITE_OTHER): Payer: Self-pay | Admitting: Ophthalmology

## 2016-12-08 LAB — CULTURE, BLOOD (ROUTINE X 2)
Culture: NO GROWTH
Culture: NO GROWTH
SPECIAL REQUESTS: ADEQUATE
SPECIAL REQUESTS: ADEQUATE

## 2016-12-20 ENCOUNTER — Encounter (INDEPENDENT_AMBULATORY_CARE_PROVIDER_SITE_OTHER): Payer: Medicare Other | Admitting: Ophthalmology

## 2016-12-20 DIAGNOSIS — I10 Essential (primary) hypertension: Secondary | ICD-10-CM

## 2016-12-20 DIAGNOSIS — H35033 Hypertensive retinopathy, bilateral: Secondary | ICD-10-CM

## 2016-12-20 DIAGNOSIS — H353112 Nonexudative age-related macular degeneration, right eye, intermediate dry stage: Secondary | ICD-10-CM | POA: Diagnosis not present

## 2016-12-20 DIAGNOSIS — H43813 Vitreous degeneration, bilateral: Secondary | ICD-10-CM

## 2016-12-20 DIAGNOSIS — H353121 Nonexudative age-related macular degeneration, left eye, early dry stage: Secondary | ICD-10-CM

## 2016-12-20 DIAGNOSIS — H35372 Puckering of macula, left eye: Secondary | ICD-10-CM

## 2017-07-03 IMAGING — CT CT HEAD W/O CM
2 series · 15 of 30 positions shown, 17 images · non-contrast
Comparison: 09/15/2014  .

CLINICAL DATA: Fall getting out of the shower, head and neck injury

EXAM:
CT HEAD WITHOUT CONTRAST
CT CERVICAL SPINE WITHOUT CONTRAST
TECHNIQUE: Multidetector CT imaging of the head and cervical spine was
performed following the standard protocol without intravenous
contrast. Multiplanar CT image reconstructions of the cervical spine
were also generated.

[Series 2: head without · axial · non-contrast · 0.40mm/px · z∈[-137,-17]mm · 7 of 32 slices shown, 9 images]
[im 4/32  brain]
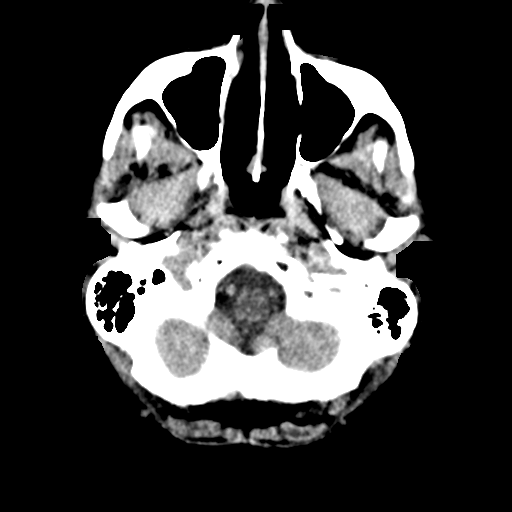
[im 4/32  bone]
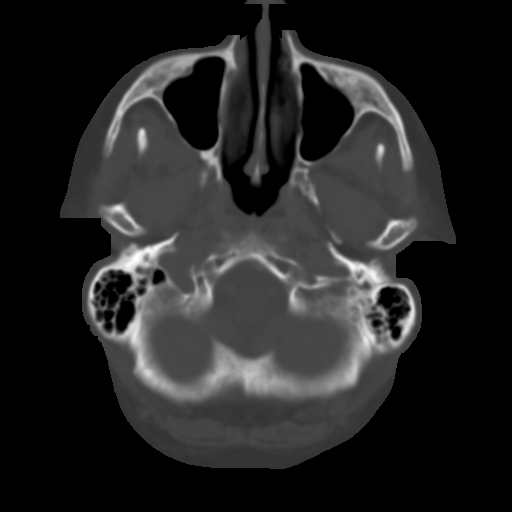
[im 8/32  brain]
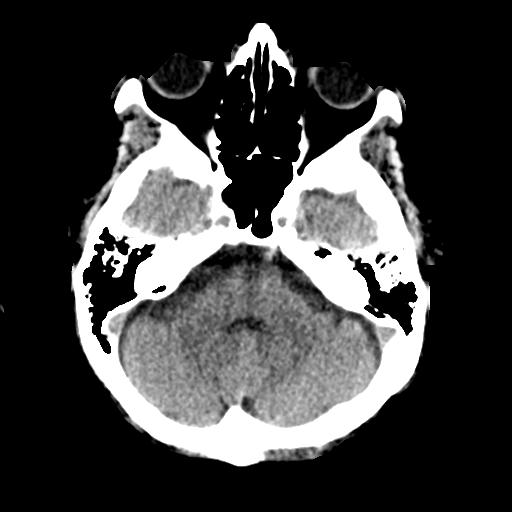
[im 12/32  brain]
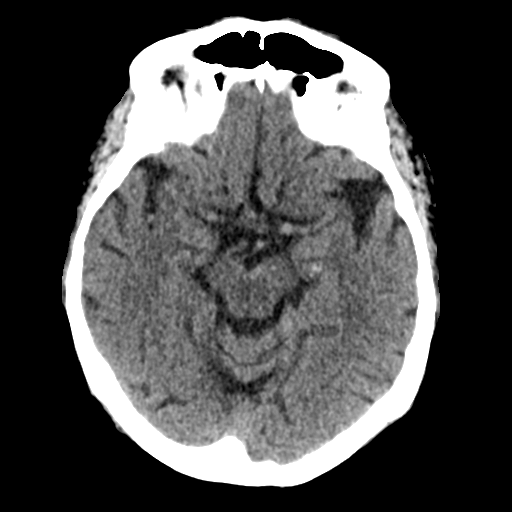
[im 16/32  brain]
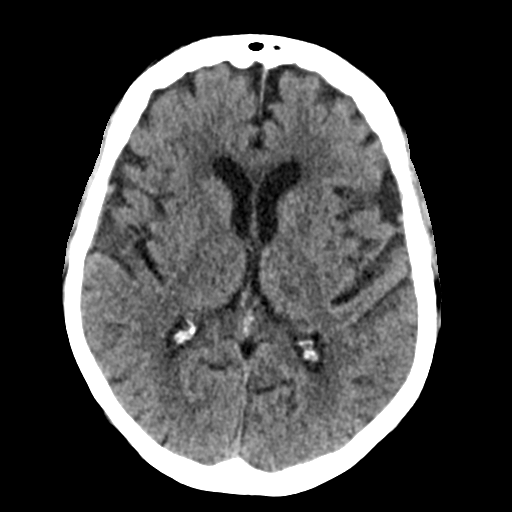
[im 20/32  brain]
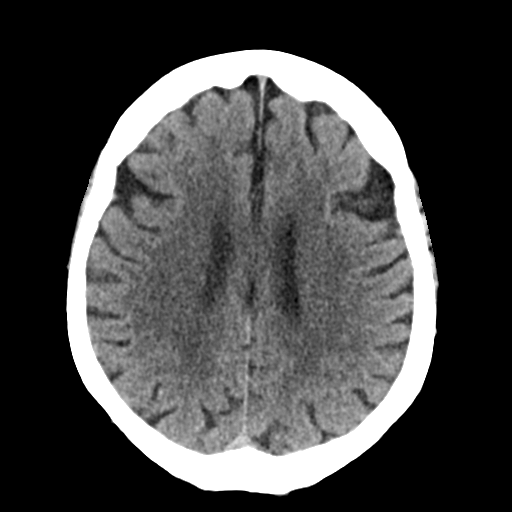
[im 20/32  bone]
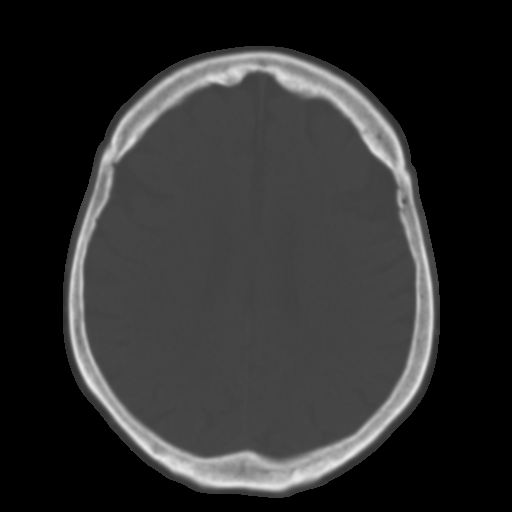
[im 24/32  brain]
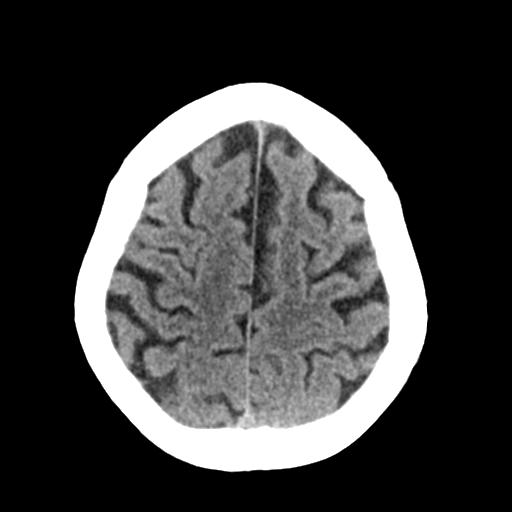
[im 28/32  brain]
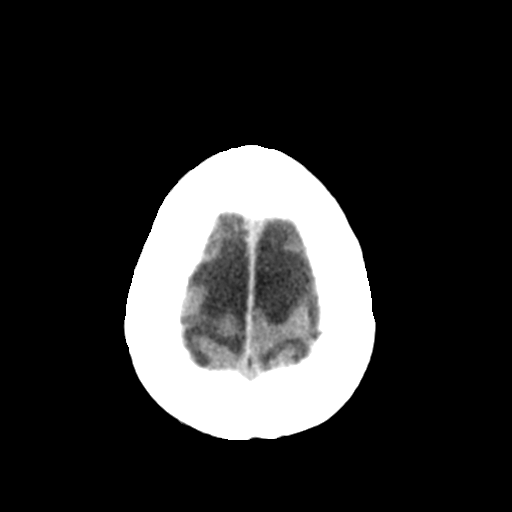

[Series 3: head bone · axial · 0.40mm/px · z∈[-138,-10]mm · 8 of 80 slices shown]
[im 8/80  bone]
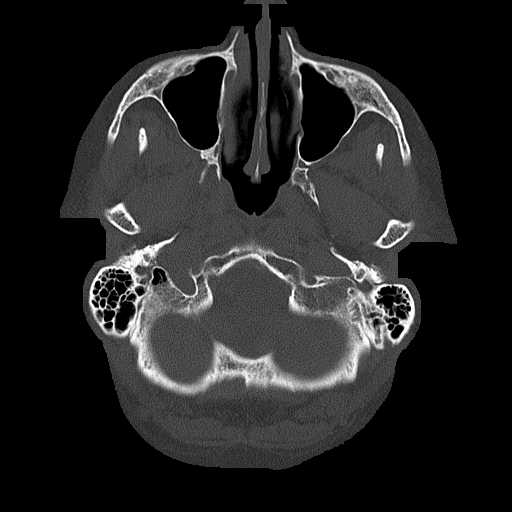
[im 16/80  bone]
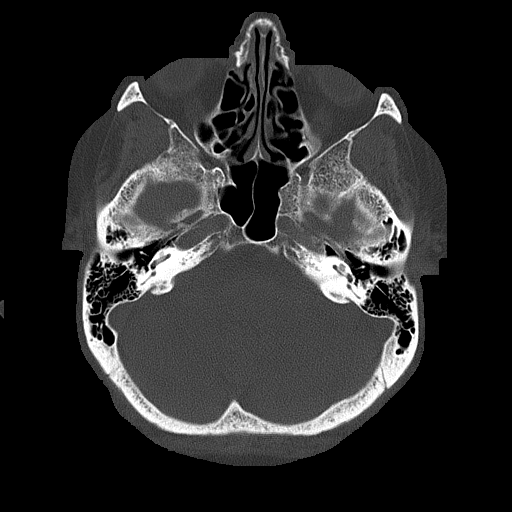
[im 24/80  bone]
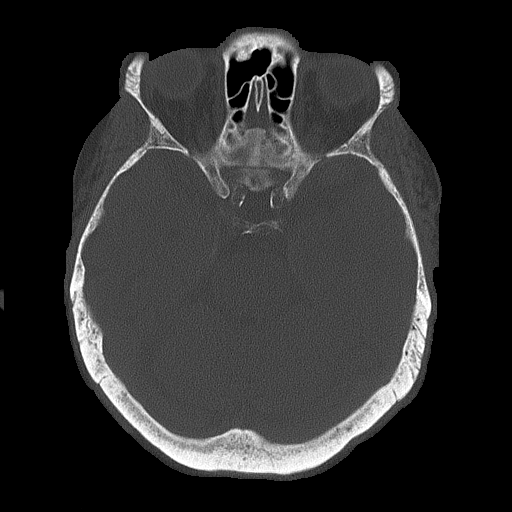
[im 36/80  bone]
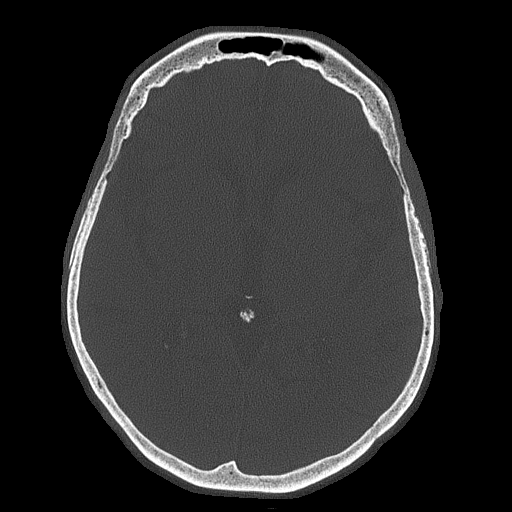
[im 44/80  bone]
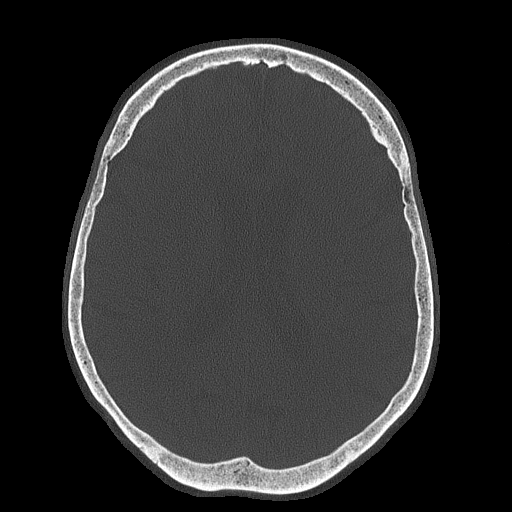
[im 56/80  bone]
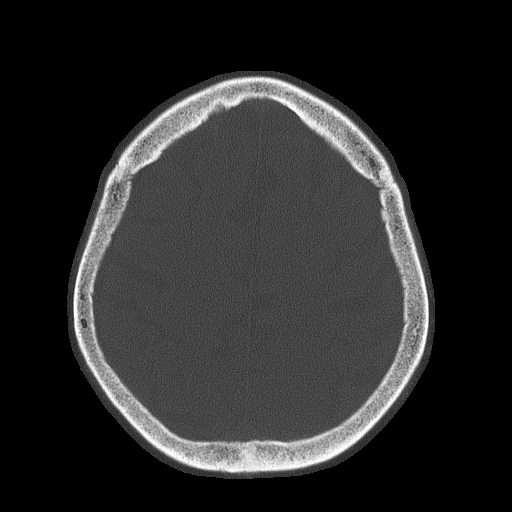
[im 64/80  bone]
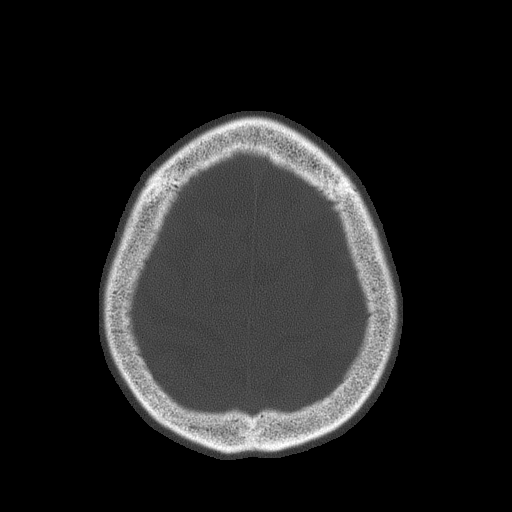
[im 72/80  bone]
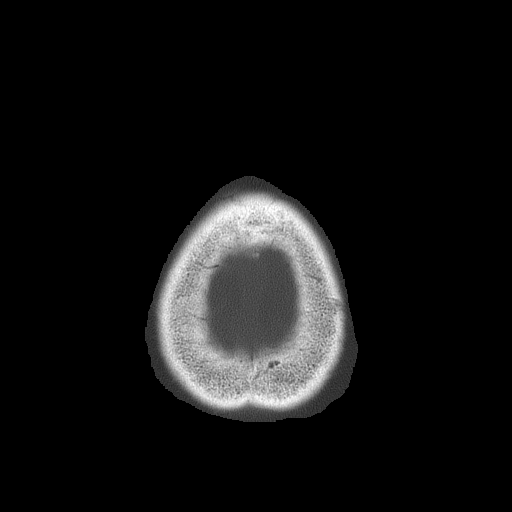

[15 of 30 positions shown; findings below may reference images not displayed]

FINDINGS: CT HEAD FINDINGS

Stable diffuse brain atrophy and chronic white matter microvascular
ischemic changes throughout the cerebral hemispheres. No acute
intracranial hemorrhage, mass lesion, definite infarction, midline
shift, herniation, hydrocephalus, or extra-axial fluid collection.
No focal mass effect or edema. Cisterns are patent. Cerebellar
atrophy as well. Atherosclerosis of the intracranial vessels of the
skullbase.

Orbits are symmetric. Skull appears intact. Mastoids and sinuses
appear clear.

CT CERVICAL SPINE FINDINGS

Severe diffuse cervical degenerative changes and spondylosis at all
levels. Multilevel facet arthropathy. Alignment demonstrates grade 1
anterolisthesis of C4 on C5 measuring 5 mm secondary to facet
arthropathy. No acute osseous finding or fracture. Degenerative
changes of the C1-2 articulation. Normal prevertebral soft tissues.
No soft tissue asymmetry or hematoma neck. Carotid atherosclerosis
evident. Lung apices are clear.
IMPRESSION: Chronic atrophy and white matter microvascular ischemic change
without acute intracranial finding.

Severe cervical degenerative changes and spondylosis with grade 1 C4
on C5 anterolisthesis secondary to facet arthropathy

No acute osseous finding or fracture.

## 2017-08-25 HISTORY — PX: TRANSTHORACIC ECHOCARDIOGRAM: SHX275

## 2018-01-30 ENCOUNTER — Telehealth: Payer: Self-pay | Admitting: Cardiology

## 2018-01-30 NOTE — Telephone Encounter (Signed)
I seem to remember this a while ago.  It is okay for her to see me.  Bryan Lemma, MD

## 2018-01-30 NOTE — Telephone Encounter (Signed)
Routed to MD & RN to review. No notation in chart to reflect this.

## 2018-01-30 NOTE — Telephone Encounter (Signed)
New message   Patient states that she spoke with Dr. Herbie Baltimore and she states that he said ok to schedule as a new patient . Please advise.

## 2018-01-31 NOTE — Telephone Encounter (Signed)
Appointment scheduled.

## 2018-03-01 ENCOUNTER — Encounter: Payer: Self-pay | Admitting: Cardiology

## 2018-03-01 ENCOUNTER — Ambulatory Visit: Payer: Medicare Other | Admitting: Cardiology

## 2018-03-01 VITALS — BP 140/80 | HR 64 | Ht 64.0 in | Wt 193.0 lb

## 2018-03-01 DIAGNOSIS — I255 Ischemic cardiomyopathy: Secondary | ICD-10-CM | POA: Diagnosis not present

## 2018-03-01 DIAGNOSIS — I2510 Atherosclerotic heart disease of native coronary artery without angina pectoris: Secondary | ICD-10-CM | POA: Diagnosis not present

## 2018-03-01 DIAGNOSIS — I5042 Chronic combined systolic (congestive) and diastolic (congestive) heart failure: Secondary | ICD-10-CM | POA: Diagnosis not present

## 2018-03-01 DIAGNOSIS — I10 Essential (primary) hypertension: Secondary | ICD-10-CM

## 2018-03-01 DIAGNOSIS — G4733 Obstructive sleep apnea (adult) (pediatric): Secondary | ICD-10-CM

## 2018-03-01 NOTE — Progress Notes (Signed)
PCP: Turbyfill, Holly I, NP Former Cardiologist: Dr. Pieter PartridgeZahid Aziz [place] (formerly [facility] then [facility]).  Clinic Note: Chief Complaint  Patient presents with  . New Patient (Initial Visit)    EstabliVernon Preysh local cardiologist  . Coronary Artery Disease    LAD CTO  . Cardiomyopathy    EF 40 and 45%.  GRII CHF    HPI: Bailey Duke is a 85 y.o. female with a PMH of CAD & DHF (ICM - EF 40-45% by Echo), HTN, OSA-CPAP (borderline compliance & CKD-III who presents to establish local cardiologist for her CHRONIC ISCHEMIC CARDIOMYOPATHY AND CONGESTIVE HEART FAILURE.  She is being seen today at the request of Claybon JabsBrown, Emily, PA-C.  ~ Self referred at the request of Claybon JabsBrown, Emily, PA-C --she is a [facility].  Currently living in Assisted Living.  Was an acquaintance of my father who was a former resident at [facility].  Bailey Duke was last seen by Hillery JacksBrandy Lanette Younts-Davis, NP ([facility]) --third provider in a year and a half after her prior cardiologist retirement.  --Denies chest pain shortness of breath dizziness palpitations.  No PND or orthopnea.  Generalized fatigue and weakness in her legs.  Recommended PT. -->  Check echocardiogram to reassess pericardial effusion.  Recent Hospitalizations: --Hospitalized in November of in December 2018 for volume overload and pneumonia.  Studies Personally Reviewed - (if available, images/films reviewed: From Epic Chart or Care Everywhere)  TTE 08/2017 [facility]): EF 40-45%. Akinetic Anteroapical wall.  Mild MR/TR.  TTE December 16, 2016: EF 35-40%.  Anteroseptal, mid-distal anterior wall and apex akinetic and thinned consistent with old infarct.  Mild MR and TR.  No significant change from May 2018.  Cath 07/2014; LMCA: Mild luminal irregularities <20%.  LAD: 100% ( sub totaled) L--L collaterals. LCx: Mild luminal irregularities <20%, RCA: Mild  luminal irregularities <20%. EF ~30% anterior-apical Akinesis. --> sent for viability assessment. --> felt that the anterior wall is ~ aneurysmal & likely mostly Infarct.  Nuc Med - 06/22/2016 - Large anterior apical infarct. Difficult to exclude mild peri-infract ischemia --no definitive reversibility.  EF ~32% - paradoxical septum. Read as High Risk b/c size of defect & low EF.   Accompanied by caregiver from [facility]  Interval History: Dr. Phillips ClimesBrame is a very pleasant elderly woman who came in today with her walker.  She was having a hard time walking in, was very uncomfortable having lots of back and hip/knee pain from her arthritis and fibromyalgia.  Her entire right side from the shoulder to the back and hips as well as down the leg were all painful.  She was quite short of breath upon initially sitting down, but regained her breath after seeing being seated for a while.  She tells me that this is pretty much baseline for her and that it really seems to be more related to her aches and pains than true dyspnea.   She has a little bit of orthopnea, but uses CPAP and that controls it.  She has been trying to be more consistent with using CPAP but does forget at times.  She therefore tends to sleep sitting upright.  She does wear TED hose which keeps her edema well at day.  She is currently on torsemide at 20 mg daily along with spironolactone.  She does not take additional dose of torsemide with much frequency.   Although she clearly has exertional dyspnea,  se denies having any chest tightness or pressure with rest or exertion.   Occasional skipped beats, but no prolonged or rapid palpitations, lightheadedness, dizziness, weakness. No syncope/near syncope or TIA/amaurosis fugax symptoms. No melena, hematochezia, hematuria, or epstaxis. No claudication.  ROS: A comprehensive was performed. Review of Systems  Constitutional: Positive for malaise/fatigue (Does not have a lot of energy --this seems  to be a chronic problem dating back before 2016.). Negative for chills and fever.  HENT: Negative for congestion and nosebleeds.   Respiratory: Positive for cough (Off and on.  Nothing prolonged) and shortness of breath (Mostly with exertion.). Negative for wheezing.   Cardiovascular: Positive for leg swelling (Controlled with support stockings and torsemide). Negative for claudication.  Gastrointestinal: Positive for heartburn (Usually well controlled). Negative for abdominal pain, blood in stool, constipation, diarrhea and melena.  Genitourinary: Negative for hematuria.  Musculoskeletal: Positive for back pain, joint pain and myalgias. Negative for falls.       Fibromyalgia and diffuse arthritis.  Right side is much worse than left.  Neurological: Negative for dizziness, tingling, focal weakness (Right side does seem a little bit weaker, but more because of pain) and headaches.  Psychiatric/Behavioral: Positive for memory loss (Some short-term loss, but remains relatively sharp.). Negative for depression. The patient is not nervous/anxious and does not have insomnia (Sleeps well.  At least 7 hours a night.).   All other systems reviewed and are negative.   I have reviewed and (if needed) personally updated the patient's problem list, medications, allergies, past medical and surgical history, social and family history.   Past Medical History:  Diagnosis Date  . B12 deficiency anemia   . Benign paroxysmal vertigo   . Chronic combined systolic and diastolic CHF, NYHA class 2 (HCC)    On spironolactone and torsemide; losartan and carvedilol at low-dose.  . Chronic kidney disease, stage III (moderate) (HCC)   . Coronary artery disease, occlusive 07/2014   LAD 100% subtotal CTO with left to left collaterals.  Mild disease elsewhere. --Not thought to be PCI amenable due to aneurysmal anterior wall  . Essential hypertension   . GERD (gastroesophageal reflux disease)   . Hyperlipidemia   .  Hypersomnia   . Hypertension   . Hypothyroid   . Ischemic cardiomyopathy 07/2014   Occluded LAD with initial EF of 30 to 35% and anterior to apical akinesis.  Most recent EF (August 2019) was 40 to 45%.  Marland Kitchen OAB (overactive bladder)   . Osteoarthritis   . Proliferative retinopathy of left eye    Non-Diabetic  . Sleep apnea    on CPAP qhs  . Spinal stenosis   . Systolic CHF (HCC)   . Vitamin D deficiency     Past Surgical History:  Procedure Laterality Date  . adenoidectomy    . Bilateral total hip replacements    . Bilateral Total Knee replacements    . CATARACT EXTRACTION, BILATERAL    . COLECTOMY    . COLON SURGERY    . COLONOSCOPY    . Cyst and partial ovary removed    . eyebrow and eyelid surgery    . JOINT REPLACEMENT    . LEFT HEART CATH AND CORONARY ANGIOGRAPHY  07/2014   [place] ([facility]): LMCA: Mild luminal irregularities <20%.  LAD: 100% ( sub totaled) L--L collaterals. LCx: Mild luminal irregularities <20%, RCA: Mild luminal irregularities <20%. EF ~30% anterior-apical Akinesis. --> sent for viability assessment. --> felt that the anterior wall is ~ aneurysmal &  likely mostly Infarct.  . LUMBAR DISC SURGERY    . NM MYOVIEW LTD  05/2016   [facility]: Large anterior apical infarct. Difficult to exclude mild peri-infract ischemia.  EF ~32% - paradoxical septum. Read as High Risk b/c size of defect & low EF.  . Right Wrist and hand surgery    . STAPEDECTOMY    . TRANSTHORACIC ECHOCARDIOGRAM  08/2017   [facility]: EF 40-45%. Akinetic Anteroapical wall.  Mild MR/TR.;; TTE December 16, 2016: EF 35-40%.  Anteroseptal, mid-distal anterior wall and apex akinetic and thinned consistent with old infarct.  Mild MR and TR.  No significant change from May 2018.  Marland Kitchen Uterus polypectomy    . Varicose Vein Surgery on left      Current Meds  Medication Sig  . acetaminophen (TYLENOL) 325 MG tablet Take 650 mg by mouth every 4 (four) hours as needed for mild pain or fever.    Marland Kitchen allopurinol (ZYLOPRIM) 100 MG tablet Take 1 tablet by mouth daily.  Marland Kitchen atorvastatin (LIPITOR) 40 MG tablet Take 40 mg daily by mouth.  . carvedilol (COREG) 3.125 MG tablet Take 1 tablet by mouth 2 (two) times daily.  . Cholecalciferol (VITAMIN D) 2000 UNITS CAPS Take 3,000 Units every morning by mouth.   . Cyanocobalamin (VITAMIN B-12 IJ) Inject 1,000 mcg as directed every 30 (thirty) days.  . diclofenac sodium (VOLTAREN) 1 % GEL Apply 4 g topically every 6 (six) hours as needed (for lumbosacral spondylosis).  . Ferrous Sulfate (SLOW FE) 142 (45 Fe) MG TBCR Take 1 tablet by mouth 2 (two) times daily.  . fexofenadine (ALLEGRA) 60 MG tablet Take 60 mg by mouth daily.  Marland Kitchen gabapentin (NEURONTIN) 100 MG capsule Take 2 capsules by mouth at bedtime.  Marland Kitchen glucosamine-chondroitin 500-400 MG tablet Take 1 tablet by mouth daily.  Boris Lown Oil 500 MG CAPS Take 1 capsule by mouth daily.  Marland Kitchen levothyroxine (SYNTHROID, LEVOTHROID) 125 MCG tablet Take 125 mcg daily before breakfast by mouth.  . Liniments (PENETRAN PLUS EX) Apply 1 application topically 3 (three) times daily as needed (for joint pain).  Marland Kitchen losartan (COZAAR) 25 MG tablet Take 1 tablet by mouth daily.  . magnesium oxide (MAG-OX) 400 MG tablet Take 400 mg by mouth at bedtime.  . Melatonin 5 MG TABS Take 1 tablet by mouth at bedtime.  . Menthol, Topical Analgesic, (BIOFREEZE) 4 % GEL Apply 1 application topically 3 (three) times daily as needed (for joint pain).  . modafinil (PROVIGIL) 200 MG tablet Take 200 mg by mouth daily.  . Multiple Vitamins-Minerals (MULTIVITAMIN PO) Take 1 tablet by mouth every morning.  . Omeprazole 20 MG TBEC Take 1 tablet by mouth daily.  Marland Kitchen oxybutynin (DITROPAN-XL) 5 MG 24 hr tablet Take 5 mg 2 (two) times daily by mouth.   . RA KRILL OIL 500 MG CAPS Take 1 capsule by mouth daily. Reported on 04/08/2015  . senna-docusate (SENNA-TIME S) 8.6-50 MG tablet Take 1 tablet by mouth daily.  Marland Kitchen spironolactone (ALDACTONE) 25 MG tablet  Take 25 mg by mouth daily.  Marland Kitchen torsemide (DEMADEX) 20 MG tablet Take 20 mg daily by mouth.  . traMADol (ULTRAM) 50 MG tablet Take 50 mg by mouth every 6 (six) hours as needed for moderate pain.  . vitamin C (ASCORBIC ACID) 500 MG tablet Take 1,000 mg by mouth every morning. Reported on 04/08/2015    Allergies  Allergen Reactions  . Tetanus Toxoids Other (See Comments)    RED LINE FROM INJECTION  SITE DOWN ARM  . Penicillins Other (See Comments)    1940 FORMS - TINY ITCHY PIMPLES ON FINGERS  . Procaine Other (See Comments)    ?    Marland Kitchen Sulfa Antibiotics     Social History   Tobacco Use  . Smoking status: Never Smoker  . Smokeless tobacco: Never Used  Substance Use Topics  . Alcohol use: Yes    Alcohol/week: 2.0 standard drinks    Types: 2 Shots of liquor per week    Comment: rum  . Drug use: No   Social History   Social History Narrative   Widowed.   Former Holiday representative --    PhD   Resident at [employer] [facility] at [facility]   Walks with a rolling walker.   Uses CPAP at night.   Walks back and forth to [employer] and around the [employer], but does not do routine exercise.   Family History family history includes Breast cancer in her mother; Congestive Heart Failure in her maternal grandmother; Diabetes in her maternal grandmother and mother; Other in her father; Stroke in her father.  Wt Readings from Last 3 Encounters:  03/01/18 193 lb (87.5 kg)  12/01/16 187 lb 6.3 oz (85 kg)  12/02/16 187 lb (84.8 kg)    PHYSICAL EXAM BP 140/80   Pulse 64   Ht 5\' 4"  (1.626 m)   Wt 193 lb (87.5 kg)   BMI 33.13 kg/m  Physical Exam  Constitutional: She is oriented to person, place, and time.  Chronically ill-appearing elderly woman.  Walking with a walker under notable musculoskeletal discomfort.  Well-groomed.  HENT:  Head: Normocephalic and atraumatic.  Mouth/Throat: Oropharynx is clear and moist.  Hard of hearing  Eyes: Conjunctivae and EOM  are normal.  Neck: Normal range of motion. Neck supple. Hepatojugular reflux (mild) and JVD (Difficult to assess, but appears to be mildly elevated (8-10 cm H20) with mild HJR) present. Carotid bruit is not present.  Cardiovascular: Normal rate, regular rhythm, S1 normal and S2 normal.  No extrasystoles are present. PMI is not displaced. Exam reveals distant heart sounds and decreased pulses (Diminished pedal pulses, but palpable). Exam reveals no gallop (Cannot exclude soft S4 gallop) and no friction rub.  No murmur heard. Pulmonary/Chest: Effort normal and breath sounds normal. No respiratory distress. She has no wheezes. She has no rales.  Abdominal: Soft. Bowel sounds are normal. She exhibits no distension. There is no abdominal tenderness. There is no rebound.  No HSM  Musculoskeletal: Normal range of motion.        General: Edema (Trivial edema with support stockings in place.) present.     Comments: Diffuse right-sided arthralgias while walking.  Antalgic gait.  Neurological: She is alert and oriented to person, place, and time. No cranial nerve deficit.  Skin: Skin is warm and dry.  Psychiatric: She has a normal mood and affect. Her behavior is normal. Judgment and thought content normal.  She does have some mild memory issues with poor recall, and not 100% sure of her medications but otherwise is quite coherent.  Somewhat tangential answers.  Vitals reviewed.    Adult ECG Report  Rate: 64 ;  Rhythm: normal sinus rhythm and LVH with repolarization changes.  Anterior septal MI, age undetermined.  Otherwise normal axis, intervals and durations.;   Narrative Interpretation: Stable and compared to PCP EKG   Other studies Reviewed: Additional studies/ records that were reviewed today include:  Recent Labs: None currently  available.  Some labs in care everywhere reviewed, but full chemistry panel and lipid panel not performed   Chest x-ray January 26, 2018: Cardiomegaly with no evidence  of superimposed acute process.  Moderate ectasia of thoracic aorta.  No acute infiltrate or edema.   EKG from PCP January 27, 2018: Sinus rhythm with rate 65 bpm.  Borderline LVH with repull changes.  Cannot exclude inferior and anteroseptal infarct, age undetermined.  Nonspecific ST and T wave changes.  Axis and intervals.   ASSESSMENT / PLAN:  Dr. Phillips ClimesBrame is overall stable from a CAD/ischemic cardiomyopathy/CHF standpoint.  Euvolemic on exam and on stable regimen.  We simply reviewed her history and medical regimen.  At this point only suggestion is to ensure that she understands sliding scale Lasix noted below.  Problem List Items Addressed This Visit    Chronic combined systolic and diastolic heart failure, NYHA class 2 (HCC) - Primary (Chronic)    Clinically, she appears to be euvolemic.  Relatively stable overall. Edema seems to be controlled with support stockings.  Her exertional dyspnea seems to be more related to musculoskeletal pain issues. Would be reluctant to push her carvedilol or losartan doses any further.  At current dose of losartan and given her age I do not think we should [facility]. She is on spironolactone a stable dose along with torsemide.  We discussed sliding scale torsemide (see Patient Instructions)  With her being on ARB, spironolactone and torsemide, would recommend intermittent chemistry panels to evaluate renal function and electrolytes      Relevant Medications   carvedilol (COREG) 3.125 MG tablet   losartan (COZAAR) 25 MG tablet   Coronary artery disease involving native coronary artery of native heart without angina pectoris (Chronic)    Occlusive CAD with 100% LAD lesion.  No active angina.  She seems to recall an episode may be 2014 2015 when she had a prolonged chest pain spell that probably was her heart attack.  She never sought any evaluation at that time. Has not had any hospitalization for heart failure since late 2018. Has not had any  angina since her cath.  Is on low-dose beta-blocker, ARB and statin.  She says she is taking aspirin, but not listed.  Per previous recommendations, would not pursue PCI of LAD given low likelihood of viability.      Relevant Medications   carvedilol (COREG) 3.125 MG tablet   losartan (COZAAR) 25 MG tablet   Other Relevant Orders   EKG 12-Lead (Completed)   Hypertension (Chronic)    Borderline blood pressure today, but I think allowing of mild permissive hypertension is probably best to avoid orthostatic changes.  Would not further titrate meds at this time.      Relevant Medications   carvedilol (COREG) 3.125 MG tablet   losartan (COZAAR) 25 MG tablet   Other Relevant Orders   EKG 12-Lead (Completed)   Ischemic cardiomyopathy (Chronic)    EF is probably in the roughly 40% range with 2018 echo showing EF of 35 to 40% and the most recent 1 showing EF of 40 to 45%.  This goes against the finding of Myoview in May 2018 showing EF 32%.  There is clear evidence of thinning in the anterior wall consistent with prior infarct and her known CAD.  Because of age, not ICD candidate. On max tolerable dose is currently of beta-blocker, ARB, spironolactone and torsemide.      Relevant Medications   carvedilol (COREG) 3.125 MG tablet  losartan (COZAAR) 25 MG tablet   Obstructive sleep apnea    Still using CPAP.         I spent a total of 40-45 minutes with the patient and chart review. >  50% of the time was spent in direct patient consultation.  We spent a lot of time reviewing her history and evaluations that have been done.  She is a somewhat tangential historian and therefore difficult to fully assess her current symptoms  Prior to the visit, I was able to review some of her prior history (15-20 minutes) and then following the visit I spent an additional 35 to 40 minutes -total 55 minutes) reviewing care everywhere and her PCP note I have updated the HPI with several reports including  nuclear stress test, echocardiograms cath reports.  I reviewed several cardiology clinic notes from her previous cardiologist.  Current medicines are reviewed at length with the patient today.  (+/- concerns) none The following changes have been made:  Discussed sliding scale torsemide  Patient Instructions  Medication Instructions:  DAILY  WEIGHT - IF INCREASE OF 3 LBS OR MORE 24 HOUR PERIOD  TAKE 40 MG  TORSEMIDE  OR IF SHORT OF BREATHE LAYING FLAT ( ORTHOPNEA SYMPTOMS  TAKE 40 MG TORSEMIDE  FOR ONE DOSE FOR EACH EPISODE IN 24 HOUR PERIOD, If you need a refill on your cardiac medications before your next appointment, please call your pharmacy.   Lab work: NOT NEEDED If you have labs (blood work) drawn today and your tests are completely normal, you will receive your results only by: Marland Kitchen MyChart Message (if you have MyChart) OR . A paper copy in the mail If you have any lab test that is abnormal or we need to change your treatment, we will call you to review the results.  Testing/Procedures: NOT NEEDED  Follow-Up: At [facility], you and your health needs are our priority.  As part of our continuing mission to provide you with exceptional heart care, we have created designated Provider Care Teams.  These Care Teams include your primary Cardiologist (physician) and Advanced Practice Providers (APPs -  Physician Assistants and Nurse Practitioners) who all work together to provide you with the care you need, when you need it. You will need a follow up appointment in 6 months AUG 2020.  Please call our office 2 months in advance to schedule this appointment.  You may see Dr Herbie Baltimore or one of the following Advanced Practice Providers on your designated Care Team:   Theodore Demark, PA-C . Joni Reining, DNP, ANP  Any Other Special Instructions Will Be Listed Below (If Applicable).     Studies Ordered:   Orders Placed This Encounter  Procedures  . EKG 12-Lead      Bryan Lemma,  M.D., M.S. Interventional Cardiologist   Pager # [phone] Phone # [phone] [address] [place], [place] [address]   Thank you for choosing Heart[facility] at [facility]!!

## 2018-03-01 NOTE — Patient Instructions (Addendum)
Medication Instructions:  DAILY  WEIGHT - IF INCREASE OF 3 LBS OR MORE 24 HOUR PERIOD  TAKE 40 MG  TORSEMIDE  OR IF SHORT OF BREATHE LAYING FLAT ( ORTHOPNEA SYMPTOMS  TAKE 40 MG TORSEMIDE  FOR ONE DOSE FOR EACH EPISODE IN 24 HOUR PERIOD, If you need a refill on your cardiac medications before your next appointment, please call your pharmacy.   Lab work: NOT NEEDED If you have labs (blood work) drawn today and your tests are completely normal, you will receive your results only by: Marland Kitchen MyChart Message (if you have MyChart) OR . A paper copy in the mail If you have any lab test that is abnormal or we need to change your treatment, we will call you to review the results.  Testing/Procedures: NOT NEEDED  Follow-Up: At [facility], you and your health needs are our priority.  As part of our continuing mission to provide you with exceptional heart care, we have created designated Provider Care Teams.  These Care Teams include your primary Cardiologist (physician) and Advanced Practice Providers (APPs -  Physician Assistants and Nurse Practitioners) who all work together to provide you with the care you need, when you need it. You will need a follow up appointment in 6 months AUG 2020.  Please call our office 2 months in advance to schedule this appointment.  You may see Dr Herbie Baltimore or one of the following Advanced Practice Providers on your designated Care Team:   Theodore Demark, PA-C . Joni Reining, DNP, ANP  Any Other Special Instructions Will Be Listed Below (If Applicable).

## 2018-03-08 ENCOUNTER — Encounter: Payer: Self-pay | Admitting: Cardiology

## 2018-03-08 DIAGNOSIS — I5042 Chronic combined systolic (congestive) and diastolic (congestive) heart failure: Secondary | ICD-10-CM | POA: Insufficient documentation

## 2018-03-08 DIAGNOSIS — I2510 Atherosclerotic heart disease of native coronary artery without angina pectoris: Secondary | ICD-10-CM | POA: Insufficient documentation

## 2018-03-08 DIAGNOSIS — I255 Ischemic cardiomyopathy: Secondary | ICD-10-CM | POA: Insufficient documentation

## 2018-03-08 NOTE — Assessment & Plan Note (Signed)
Still using CPAP 

## 2018-03-08 NOTE — Assessment & Plan Note (Signed)
Occlusive CAD with 100% LAD lesion.  No active angina.  She seems to recall an episode may be 2014 2015 when she had a prolonged chest pain spell that probably was her heart attack.  She never sought any evaluation at that time. Has not had any hospitalization for heart failure since late 2018. Has not had any angina since her cath.  Is on low-dose beta-blocker, ARB and statin.  She says she is taking aspirin, but not listed.  Per previous recommendations, would not pursue PCI of LAD given low likelihood of viability.

## 2018-03-08 NOTE — Assessment & Plan Note (Signed)
Clinically, she appears to be euvolemic.  Relatively stable overall. Edema seems to be controlled with support stockings.  Her exertional dyspnea seems to be more related to musculoskeletal pain issues. Would be reluctant to push her carvedilol or losartan doses any further.  At current dose of losartan and given her age I do not think we should switch to [facility]. She is on spironolactone a stable dose along with torsemide.  We discussed sliding scale torsemide (see Patient Instructions)  With her being on ARB, spironolactone and torsemide, would recommend intermittent chemistry panels to evaluate renal function and electrolytes

## 2018-03-08 NOTE — Assessment & Plan Note (Signed)
EF is probably in the roughly 40% range with 2018 echo showing EF of 35 to 40% and the most recent 1 showing EF of 40 to 45%.  This goes against the finding of Myoview in May 2018 showing EF 32%.  There is clear evidence of thinning in the anterior wall consistent with prior infarct and her known CAD.  Because of age, not ICD candidate. On max tolerable dose is currently of beta-blocker, ARB, spironolactone and torsemide.

## 2018-03-08 NOTE — Assessment & Plan Note (Signed)
Borderline blood pressure today, but I think allowing of mild permissive hypertension is probably best to avoid orthostatic changes.  Would not further titrate meds at this time.

## 2018-05-16 ENCOUNTER — Telehealth: Payer: Self-pay | Admitting: Cardiology

## 2018-05-16 NOTE — Telephone Encounter (Signed)
New Message   Marcelino Duster with [address] is calling to request that Dr. Herbie Baltimore will cancel and order that he put in for a patient to be weighed daily. Please call to discuss.

## 2018-05-16 NOTE — Telephone Encounter (Signed)
Bailey Duke, she states that order was given by Dr.Harding on 02/20 to do daily weights, since they have done this she has had no weight gain, actually lost weight, and only moves around by 1 lb. They are questioning if this order can be discontinued.   Please advise. Thanks!

## 2018-05-17 NOTE — Telephone Encounter (Signed)
SPOKE TO THE NURSE AT [facility] - VERBAL ORDER GIVEN PER DR HARDING INSTRUCTION S- SHE STATES THEY WOULD SEND COPY OF WEIGHTS OF THE PATIENT.

## 2018-05-17 NOTE — Telephone Encounter (Signed)
Ok to back off to weigh [employer] -- as long as weights remain stable.  We are using weights for sliding scale diuretic, so monitoring weight is important.   Bryan Lemma, MD

## 2018-06-23 ENCOUNTER — Encounter (HOSPITAL_COMMUNITY): Payer: Self-pay | Admitting: Emergency Medicine

## 2018-06-23 ENCOUNTER — Inpatient Hospital Stay (HOSPITAL_COMMUNITY)
Admission: EM | Admit: 2018-06-23 | Discharge: 2018-06-26 | DRG: 291 | Disposition: A | Discharge status: Skilled Nursing Facility | Payer: Medicare Other | Attending: Internal Medicine | Admitting: Internal Medicine

## 2018-06-23 ENCOUNTER — Other Ambulatory Visit: Payer: Self-pay

## 2018-06-23 ENCOUNTER — Observation Stay (HOSPITAL_COMMUNITY): Payer: Medicare Other

## 2018-06-23 DIAGNOSIS — R195 Other fecal abnormalities: Secondary | ICD-10-CM | POA: Diagnosis not present

## 2018-06-23 DIAGNOSIS — R0600 Dyspnea, unspecified: Secondary | ICD-10-CM

## 2018-06-23 DIAGNOSIS — I10 Essential (primary) hypertension: Secondary | ICD-10-CM | POA: Diagnosis present

## 2018-06-23 DIAGNOSIS — E213 Hyperparathyroidism, unspecified: Secondary | ICD-10-CM | POA: Diagnosis present

## 2018-06-23 DIAGNOSIS — H3522 Other non-diabetic proliferative retinopathy, left eye: Secondary | ICD-10-CM | POA: Diagnosis present

## 2018-06-23 DIAGNOSIS — I509 Heart failure, unspecified: Secondary | ICD-10-CM

## 2018-06-23 DIAGNOSIS — Z880 Allergy status to penicillin: Secondary | ICD-10-CM

## 2018-06-23 DIAGNOSIS — E785 Hyperlipidemia, unspecified: Secondary | ICD-10-CM | POA: Diagnosis present

## 2018-06-23 DIAGNOSIS — I2510 Atherosclerotic heart disease of native coronary artery without angina pectoris: Secondary | ICD-10-CM | POA: Diagnosis present

## 2018-06-23 DIAGNOSIS — I130 Hypertensive heart and chronic kidney disease with heart failure and stage 1 through stage 4 chronic kidney disease, or unspecified chronic kidney disease: Secondary | ICD-10-CM | POA: Diagnosis not present

## 2018-06-23 DIAGNOSIS — M109 Gout, unspecified: Secondary | ICD-10-CM | POA: Diagnosis present

## 2018-06-23 DIAGNOSIS — E039 Hypothyroidism, unspecified: Secondary | ICD-10-CM | POA: Diagnosis present

## 2018-06-23 DIAGNOSIS — D509 Iron deficiency anemia, unspecified: Secondary | ICD-10-CM | POA: Diagnosis present

## 2018-06-23 DIAGNOSIS — Z96643 Presence of artificial hip joint, bilateral: Secondary | ICD-10-CM | POA: Diagnosis present

## 2018-06-23 DIAGNOSIS — R0609 Other forms of dyspnea: Secondary | ICD-10-CM | POA: Diagnosis not present

## 2018-06-23 DIAGNOSIS — N183 Chronic kidney disease, stage 3 (moderate): Secondary | ICD-10-CM | POA: Diagnosis present

## 2018-06-23 DIAGNOSIS — J301 Allergic rhinitis due to pollen: Secondary | ICD-10-CM | POA: Diagnosis present

## 2018-06-23 DIAGNOSIS — Z833 Family history of diabetes mellitus: Secondary | ICD-10-CM

## 2018-06-23 DIAGNOSIS — Z20828 Contact with and (suspected) exposure to other viral communicable diseases: Secondary | ICD-10-CM | POA: Diagnosis present

## 2018-06-23 DIAGNOSIS — Z8249 Family history of ischemic heart disease and other diseases of the circulatory system: Secondary | ICD-10-CM

## 2018-06-23 DIAGNOSIS — Z79891 Long term (current) use of opiate analgesic: Secondary | ICD-10-CM

## 2018-06-23 DIAGNOSIS — R531 Weakness: Secondary | ICD-10-CM

## 2018-06-23 DIAGNOSIS — N390 Urinary tract infection, site not specified: Secondary | ICD-10-CM | POA: Diagnosis present

## 2018-06-23 DIAGNOSIS — I255 Ischemic cardiomyopathy: Secondary | ICD-10-CM | POA: Diagnosis present

## 2018-06-23 DIAGNOSIS — Z882 Allergy status to sulfonamides status: Secondary | ICD-10-CM

## 2018-06-23 DIAGNOSIS — I5021 Acute systolic (congestive) heart failure: Secondary | ICD-10-CM | POA: Diagnosis not present

## 2018-06-23 DIAGNOSIS — Z66 Do not resuscitate: Secondary | ICD-10-CM | POA: Diagnosis present

## 2018-06-23 DIAGNOSIS — F329 Major depressive disorder, single episode, unspecified: Secondary | ICD-10-CM | POA: Diagnosis present

## 2018-06-23 DIAGNOSIS — Z7984 Long term (current) use of oral hypoglycemic drugs: Secondary | ICD-10-CM

## 2018-06-23 DIAGNOSIS — Z9049 Acquired absence of other specified parts of digestive tract: Secondary | ICD-10-CM

## 2018-06-23 DIAGNOSIS — I252 Old myocardial infarction: Secondary | ICD-10-CM

## 2018-06-23 DIAGNOSIS — K219 Gastro-esophageal reflux disease without esophagitis: Secondary | ICD-10-CM | POA: Diagnosis present

## 2018-06-23 DIAGNOSIS — N3281 Overactive bladder: Secondary | ICD-10-CM | POA: Diagnosis present

## 2018-06-23 DIAGNOSIS — Z96653 Presence of artificial knee joint, bilateral: Secondary | ICD-10-CM | POA: Diagnosis present

## 2018-06-23 DIAGNOSIS — M797 Fibromyalgia: Secondary | ICD-10-CM | POA: Diagnosis present

## 2018-06-23 DIAGNOSIS — Z888 Allergy status to other drugs, medicaments and biological substances status: Secondary | ICD-10-CM

## 2018-06-23 DIAGNOSIS — G4733 Obstructive sleep apnea (adult) (pediatric): Secondary | ICD-10-CM | POA: Diagnosis present

## 2018-06-23 DIAGNOSIS — D649 Anemia, unspecified: Secondary | ICD-10-CM | POA: Diagnosis present

## 2018-06-23 DIAGNOSIS — I5042 Chronic combined systolic (congestive) and diastolic (congestive) heart failure: Secondary | ICD-10-CM | POA: Diagnosis present

## 2018-06-23 DIAGNOSIS — I5043 Acute on chronic combined systolic (congestive) and diastolic (congestive) heart failure: Secondary | ICD-10-CM | POA: Diagnosis present

## 2018-06-23 DIAGNOSIS — Z79899 Other long term (current) drug therapy: Secondary | ICD-10-CM

## 2018-06-23 LAB — FERRITIN: Ferritin: 55 ng/mL (ref 11–307)

## 2018-06-23 LAB — CBC WITH DIFFERENTIAL/PLATELET
Abs Immature Granulocytes: 0.07 10*3/uL (ref 0.00–0.07)
Basophils Absolute: 0 10*3/uL (ref 0.0–0.1)
Basophils Relative: 0 %
Eosinophils Absolute: 0.2 10*3/uL (ref 0.0–0.5)
Eosinophils Relative: 2 %
HCT: 23.7 % — ABNORMAL LOW (ref 36.0–46.0)
Hemoglobin: 7.2 g/dL — ABNORMAL LOW (ref 12.0–15.0)
Immature Granulocytes: 1 %
Lymphocytes Relative: 28 %
Lymphs Abs: 4.1 10*3/uL — ABNORMAL HIGH (ref 0.7–4.0)
MCH: 27 pg (ref 26.0–34.0)
MCHC: 30.4 g/dL (ref 30.0–36.0)
MCV: 88.8 fL (ref 80.0–100.0)
Monocytes Absolute: 1.5 10*3/uL — ABNORMAL HIGH (ref 0.1–1.0)
Monocytes Relative: 10 %
Neutro Abs: 8.8 10*3/uL — ABNORMAL HIGH (ref 1.7–7.7)
Neutrophils Relative %: 59 %
Platelets: 462 10*3/uL — ABNORMAL HIGH (ref 150–400)
RBC: 2.67 MIL/uL — ABNORMAL LOW (ref 3.87–5.11)
RDW: 15.2 % (ref 11.5–15.5)
WBC: 14.7 10*3/uL — ABNORMAL HIGH (ref 4.0–10.5)
nRBC: 0 % (ref 0.0–0.2)

## 2018-06-23 LAB — PROTIME-INR
INR: 1.1 (ref 0.8–1.2)
Prothrombin Time: 14.3 seconds (ref 11.4–15.2)

## 2018-06-23 LAB — RETICULOCYTES
Immature Retic Fract: 26.3 % — ABNORMAL HIGH (ref 2.3–15.9)
RBC.: 2.69 MIL/uL — ABNORMAL LOW (ref 3.87–5.11)
Retic Count, Absolute: 71.3 10*3/uL (ref 19.0–186.0)
Retic Ct Pct: 2.7 % (ref 0.4–3.1)

## 2018-06-23 LAB — COMPREHENSIVE METABOLIC PANEL
ALT: 42 U/L (ref 0–44)
AST: 42 U/L — ABNORMAL HIGH (ref 15–41)
Albumin: 2.4 g/dL — ABNORMAL LOW (ref 3.5–5.0)
Alkaline Phosphatase: 111 U/L (ref 38–126)
Anion gap: 11 (ref 5–15)
BUN: 21 mg/dL (ref 8–23)
CO2: 23 mmol/L (ref 22–32)
Calcium: 10.5 mg/dL — ABNORMAL HIGH (ref 8.9–10.3)
Chloride: 102 mmol/L (ref 98–111)
Creatinine, Ser: 1.29 mg/dL — ABNORMAL HIGH (ref 0.44–1.00)
GFR calc Af Amer: 42 mL/min — ABNORMAL LOW (ref >60)
GFR calc non Af Amer: 36 mL/min — ABNORMAL LOW (ref >60)
Glucose, Bld: 127 mg/dL — ABNORMAL HIGH (ref 70–99)
Potassium: 4.3 mmol/L (ref 3.5–5.1)
Sodium: 136 mmol/L (ref 135–145)
Total Bilirubin: 0.3 mg/dL (ref 0.3–1.2)
Total Protein: 5.8 g/dL — ABNORMAL LOW (ref 6.5–8.1)

## 2018-06-23 LAB — VITAMIN B12: Vitamin B-12: 933 pg/mL — ABNORMAL HIGH (ref 180–914)

## 2018-06-23 LAB — IRON AND TIBC
Iron: 13 ug/dL — ABNORMAL LOW (ref 28–170)
Saturation Ratios: 5 % — ABNORMAL LOW (ref 10.4–31.8)
TIBC: 267 ug/dL (ref 250–450)
UIBC: 254 ug/dL

## 2018-06-23 LAB — ABO/RH: ABO/RH(D): O POS

## 2018-06-23 LAB — MAGNESIUM: Magnesium: 2.2 mg/dL (ref 1.7–2.4)

## 2018-06-23 LAB — ETHANOL: Alcohol, Ethyl (B): <10 mg/dL (ref <10)

## 2018-06-23 LAB — FOLATE: Folate: 36.5 ng/mL (ref >5.9)

## 2018-06-23 LAB — TSH: TSH: 2.406 u[IU]/mL (ref 0.350–4.500)

## 2018-06-23 LAB — SARS CORONAVIRUS 2 BY RT PCR (HOSPITAL ORDER, PERFORMED IN ~~LOC~~ HOSPITAL LAB): SARS Coronavirus 2: NEGATIVE

## 2018-06-23 LAB — PREPARE RBC (CROSSMATCH)

## 2018-06-23 LAB — POC OCCULT BLOOD, ED: Fecal Occult Bld: POSITIVE — AB

## 2018-06-23 MED ORDER — LOSARTAN POTASSIUM 25 MG PO TABS
25.0000 mg | ORAL_TABLET | Freq: Every day | ORAL | Status: DC
  Administered 2018-06-25 – 2018-06-26 (×2): 25 mg via ORAL
  Filled 2018-06-23 (×2): qty 1

## 2018-06-23 MED ORDER — ONDANSETRON HCL 4 MG PO TABS: 4.0000 mg | ORAL_TABLET | Freq: Four times a day (QID) | ORAL | Status: DC | PRN

## 2018-06-23 MED ORDER — GABAPENTIN 100 MG PO CAPS
200.0000 mg | ORAL_CAPSULE | Freq: Every day | ORAL | Status: DC
  Administered 2018-06-23 – 2018-06-25 (×3): 200 mg via ORAL
  Filled 2018-06-23 (×3): qty 2

## 2018-06-23 MED ORDER — MIRABEGRON ER 25 MG PO TB24
25.0000 mg | ORAL_TABLET | Freq: Every day | ORAL | Status: DC
  Administered 2018-06-25 – 2018-06-26 (×2): 25 mg via ORAL
  Filled 2018-06-23 (×3): qty 1

## 2018-06-23 MED ORDER — TRAMADOL HCL 50 MG PO TABS
50.0000 mg | ORAL_TABLET | Freq: Four times a day (QID) | ORAL | Status: DC | PRN
  Administered 2018-06-23 – 2018-06-26 (×8): 50 mg via ORAL
  Filled 2018-06-23 (×9): qty 1

## 2018-06-23 MED ORDER — IPRATROPIUM BROMIDE 0.03 % NA SOLN
2.0000 | Freq: Two times a day (BID) | NASAL | Status: DC | PRN
  Filled 2018-06-23: qty 30

## 2018-06-23 MED ORDER — CARVEDILOL 3.125 MG PO TABS
3.1250 mg | ORAL_TABLET | Freq: Two times a day (BID) | ORAL | Status: DC
  Administered 2018-06-24 – 2018-06-26 (×4): 3.125 mg via ORAL
  Filled 2018-06-23 (×4): qty 1

## 2018-06-23 MED ORDER — SODIUM CHLORIDE 0.9 % IV SOLN: INTRAVENOUS | Status: DC

## 2018-06-23 MED ORDER — POLYETHYLENE GLYCOL 3350 17 G PO PACK: 17.0000 g | PACK | Freq: Every day | ORAL | Status: DC | PRN

## 2018-06-23 MED ORDER — DULOXETINE HCL 30 MG PO CPEP
30.0000 mg | ORAL_CAPSULE | Freq: Every morning | ORAL | Status: DC
  Administered 2018-06-25 – 2018-06-26 (×2): 30 mg via ORAL
  Filled 2018-06-23 (×2): qty 1

## 2018-06-23 MED ORDER — OXYBUTYNIN CHLORIDE ER 5 MG PO TB24: 5.0000 mg | ORAL_TABLET | Freq: Two times a day (BID) | ORAL | Status: DC

## 2018-06-23 MED ORDER — LEVOTHYROXINE SODIUM 25 MCG PO TABS
125.0000 ug | ORAL_TABLET | Freq: Every day | ORAL | Status: DC
  Administered 2018-06-24 – 2018-06-26 (×3): 125 ug via ORAL
  Filled 2018-06-23 (×3): qty 1

## 2018-06-23 MED ORDER — NYSTATIN 100000 UNIT/GM EX CREA
1.0000 "application " | TOPICAL_CREAM | Freq: Three times a day (TID) | CUTANEOUS | Status: DC | PRN
  Filled 2018-06-23: qty 15

## 2018-06-23 MED ORDER — PANTOPRAZOLE SODIUM 40 MG PO TBEC: 40.0000 mg | DELAYED_RELEASE_TABLET | Freq: Every day | ORAL | Status: DC

## 2018-06-23 MED ORDER — ATORVASTATIN CALCIUM 40 MG PO TABS
40.0000 mg | ORAL_TABLET | Freq: Every day | ORAL | Status: DC
  Administered 2018-06-25 – 2018-06-26 (×2): 40 mg via ORAL
  Filled 2018-06-23 (×2): qty 1

## 2018-06-23 MED ORDER — FERROUS SULFATE 325 (65 FE) MG PO TABS
325.0000 mg | ORAL_TABLET | Freq: Three times a day (TID) | ORAL | Status: DC
  Administered 2018-06-24 – 2018-06-26 (×6): 325 mg via ORAL
  Filled 2018-06-23 (×6): qty 1

## 2018-06-23 MED ORDER — ALLOPURINOL 100 MG PO TABS
100.0000 mg | ORAL_TABLET | Freq: Every day | ORAL | Status: DC
  Administered 2018-06-25 – 2018-06-26 (×2): 100 mg via ORAL
  Filled 2018-06-23 (×2): qty 1

## 2018-06-23 MED ORDER — FUROSEMIDE 10 MG/ML IJ SOLN
40.0000 mg | Freq: Once | INTRAMUSCULAR | Status: AC
  Administered 2018-06-23: 23:00:00 40 mg via INTRAVENOUS
  Filled 2018-06-23: qty 4

## 2018-06-23 MED ORDER — FEXOFENADINE HCL 60 MG PO TABS
60.0000 mg | ORAL_TABLET | Freq: Every day | ORAL | Status: DC
  Administered 2018-06-25 – 2018-06-26 (×2): 60 mg via ORAL
  Filled 2018-06-23 (×3): qty 1

## 2018-06-23 MED ORDER — NON FORMULARY: 60.0000 mg | Freq: Every day | Status: DC

## 2018-06-23 MED ORDER — ACETAMINOPHEN 325 MG PO TABS
650.0000 mg | ORAL_TABLET | ORAL | Status: DC | PRN
  Administered 2018-06-25: 650 mg via ORAL
  Filled 2018-06-23: qty 2

## 2018-06-23 MED ORDER — PANTOPRAZOLE SODIUM 40 MG IV SOLR
40.0000 mg | Freq: Two times a day (BID) | INTRAVENOUS | Status: DC
  Administered 2018-06-23 – 2018-06-25 (×4): 40 mg via INTRAVENOUS
  Filled 2018-06-23 (×5): qty 40

## 2018-06-23 MED ORDER — SODIUM CHLORIDE 0.9 % IV SOLN
10.0000 mL/h | Freq: Once | INTRAVENOUS | Status: AC
  Administered 2018-06-25: 11:00:00 10 mL/h via INTRAVENOUS

## 2018-06-23 MED ORDER — ONDANSETRON HCL 4 MG/2ML IJ SOLN
4.0000 mg | Freq: Four times a day (QID) | INTRAMUSCULAR | Status: DC | PRN
  Administered 2018-06-24: 4 mg via INTRAVENOUS
  Filled 2018-06-23: qty 2

## 2018-06-23 MED ORDER — SENNOSIDES-DOCUSATE SODIUM 8.6-50 MG PO TABS
1.0000 | ORAL_TABLET | Freq: Every day | ORAL | Status: DC
  Administered 2018-06-25 – 2018-06-26 (×2): 1 via ORAL
  Filled 2018-06-23 (×3): qty 1

## 2018-06-23 MED ORDER — MELATONIN 3 MG PO TABS
4.5000 mg | ORAL_TABLET | Freq: Every day | ORAL | Status: DC
  Administered 2018-06-23 – 2018-06-25 (×3): 4.5 mg via ORAL
  Filled 2018-06-23 (×4): qty 1.5

## 2018-06-23 MED ORDER — POLYETHYLENE GLYCOL 3350 17 G PO PACK
17.0000 g | PACK | Freq: Every day | ORAL | Status: DC | PRN
  Administered 2018-06-26: 17 g via ORAL
  Filled 2018-06-23: qty 1

## 2018-06-23 NOTE — H&P (Signed)
[facility] History and Physical  Bailey Duke TMA:[id number] DOB: [birth year] DOA: 06/23/2018  Referring phyician: Cathren Laine, MD PCP: Bill Salinas I, NP   Chief Complaint: increased weakness, low hemoglobin  HPI: Bailey Duke is a 85 y.o. female with medical history significant for CHF with reduced EF, OSA on CPAP, CKD stage III, HTN, GERD, Chronic iron deficiency anemia, chronic hypercalcemia, leukocytosis, CAD, depression, gout who presents on 06/23/2018 from [facility] with several weeks of progressive fatigue and weakness.  She has noticed decreased ability in getting out of the bed on her own to even use the bathroom because of profound weakness. Also decreased appetite for several months, some intermittent abdominal pain located just above her umbilical area that wraps around to her back and comes and goes with no identifiable alleviating or exacerbating factors Patient has been evaluated for persistently elevated WBC and progressive anemia for the past month ( hgb 4/25 8.4, 5/8 8.4, 5/9 7.6). Stool was noted to be black at the facility but patient is on iron. Fecal occult there was positive for blood. Medical staff spoke with patient and her listed HCPOAs ( ann Microbiologist and Gerlene Burdock) who wished for advanced treatment/workup for possible GI blood loss. In the past it seems she has declined further workup.   History of CHF. Last TTE from 8/20189 at [facility]. EF 40-45% with normal appearing diastolic function and mild pulmonary hypertension.  She reports taking her torsemide consistently at her ILF.  Denies any noticeable changes in her breathing, has edema in ankles at baseline that typically improves with elevation and has not noticed any worsening   ED Course: Hemodynamically stable. Noted to have hemoglobin of 7.2, Cr 1.29, BUN 21, WBC 14.1(neutrophil dominant).  Fecal occult test positive, dark brown stool noted on DRE by ED physician.  COVID test negative  Review of  Systems:,  Constitutional:  No weight loss, night sweats, Fevers, chills, fatigue.  HEENT:  No headaches, Difficulty swallowing,Tooth/dental problems,Sore throat,  No sneezing, itching, ear ache, nasal congestion, post nasal drip,  Cardio-vascular:  No chest pain, Orthopnea, PND, swelling in lower extremities, anasarca, dizziness, palpitations  GI:  No heartburn, indigestion, abdominal pain, nausea, vomiting, diarrhea, change in bowel habits, loss of appetite  Resp:  No shortness of breath with exertion or at rest. No excess mucus, no productive cough, No non-productive cough, No coughing up of blood.No change in color of mucus.No wheezing.No chest wall deformity  Skin:  no rash or lesions.  GU:  no dysuria, change in color of urine, no urgency or frequency. No flank pain.  Musculoskeletal:  No joint pain or swelling. No decreased range of motion. No back pain.  Psych:  No change in mood or affect. No depression or anxiety. No memory loss.   Past Medical History:  Diagnosis Date  . B12 deficiency anemia   . Benign paroxysmal vertigo   . Chronic combined systolic and diastolic CHF, NYHA class 2 (HCC)    On spironolactone and torsemide; losartan and carvedilol at low-dose.  . Chronic kidney disease, stage III (moderate) (HCC)   . Coronary artery disease, occlusive 07/2014   LAD 100% subtotal CTO with left to left collaterals.  Mild disease elsewhere. --Not thought to be PCI amenable due to aneurysmal anterior wall  . Essential hypertension   . GERD (gastroesophageal reflux disease)   . Hyperlipidemia   . Hypersomnia   . Hypertension   . Hypothyroid   . Ischemic cardiomyopathy 07/2014   Occluded LAD with initial  EF of 30 to 35% and anterior to apical akinesis.  Most recent EF (August 2019) was 40 to 45%.  Marland Kitchen OAB (overactive bladder)   . Osteoarthritis   . Proliferative retinopathy of left eye    Non-Diabetic  . Sleep apnea    on CPAP qhs  . Spinal stenosis   . Systolic CHF  (HCC)   . Vitamin D deficiency    Past Surgical History:  Procedure Laterality Date  . adenoidectomy    . Bilateral total hip replacements    . Bilateral Total Knee replacements    . CATARACT EXTRACTION, BILATERAL    . COLECTOMY    . COLON SURGERY    . COLONOSCOPY    . Cyst and partial ovary removed    . eyebrow and eyelid surgery    . JOINT REPLACEMENT    . LEFT HEART CATH AND CORONARY ANGIOGRAPHY  07/2014   [place] ([facility]): LMCA: Mild luminal irregularities <20%.  LAD: 100% ( sub totaled) L--L collaterals. LCx: Mild luminal irregularities <20%, RCA: Mild luminal irregularities <20%. EF ~30% anterior-apical Akinesis. --> sent for viability assessment. --> felt that the anterior wall is ~ aneurysmal & likely mostly Infarct.  . LUMBAR DISC SURGERY    . NM MYOVIEW LTD  05/2016   [facility]: Large anterior apical infarct. Difficult to exclude mild peri-infract ischemia.  EF ~32% - paradoxical septum. Read as High Risk b/c size of defect & low EF.  . Right Wrist and hand surgery    . STAPEDECTOMY    . TRANSTHORACIC ECHOCARDIOGRAM  08/2017   [facility]: EF 40-45%. Akinetic Anteroapical wall.  Mild MR/TR.;; TTE December 16, 2016: EF 35-40%.  Anteroseptal, mid-distal anterior wall and apex akinetic and thinned consistent with old infarct.  Mild MR and TR.  No significant change from May 2018.  Marland Kitchen Uterus polypectomy    . Varicose Vein Surgery on left     Social History:  reports that she has never smoked. She has never used smokeless tobacco. She reports current alcohol use of about 2.0 standard drinks of alcohol per week. She reports that she does not use drugs.  Allergies  Allergen Reactions  . Tetanus Toxoids Other (See Comments)    RED LINE FROM INJECTION SITE DOWN ARM  . Penicillins Other (See Comments)    1940 FORMS - TINY ITCHY PIMPLES ON FINGERS Did it involve swelling of the face/tongue/throat, SOB, or low BP? Unknown Did it involve sudden or severe rash/hives,  skin peeling, or any reaction on the inside of your mouth or nose? Unknown Did you need to seek medical attention at a hospital or doctor's office? Unknown When did it last happen?1940? If all above answers are "NO", may proceed with cephalosporin use.  . Procaine Other (See Comments)    Unknown reaction  . Sulfa Antibiotics Other (See Comments)    Unknown reaction    Family History  Problem Relation Age of Onset  . Breast cancer Mother   . Diabetes Mother   . Other Father        Hardening of the arteries to the head  . Stroke Father   . Congestive Heart Failure Maternal Grandmother   . Diabetes Maternal Grandmother       Prior to Admission medications   Medication Sig Start Date End Date Taking? Authorizing Provider  acetaminophen (TYLENOL) 325 MG tablet Take 650 mg by mouth every 4 (four) hours as needed for mild pain or fever.    [provider]  allopurinol (ZYLOPRIM) 100 MG tablet Take 1 tablet by mouth daily. 02/10/18   [provider]  atorvastatin (LIPITOR) 40 MG tablet Take 40 mg daily by mouth.    [provider]  carvedilol (COREG) 3.125 MG tablet Take 1 tablet by mouth 2 (two) times daily. 10/07/16   [provider]  Cholecalciferol (VITAMIN D) 2000 UNITS CAPS Take 3,000 Units every morning by mouth.     [provider]  Cyanocobalamin (VITAMIN B-12 IJ) Inject 1,000 mcg as directed every 30 (thirty) days.    [provider]  diclofenac sodium (VOLTAREN) 1 % GEL Apply 4 g topically every 6 (six) hours as needed (for lumbosacral spondylosis).    [provider]  Ferrous Sulfate (SLOW FE) 142 (45 Fe) MG TBCR Take 1 tablet by mouth 2 (two) times daily.    [provider]  fexofenadine (ALLEGRA) 60 MG tablet Take 60 mg by mouth daily.    [provider]  gabapentin (NEURONTIN) 100 MG capsule Take 2 capsules by mouth at bedtime. 02/10/18   [provider]  glucosamine-chondroitin  500-400 MG tablet Take 1 tablet by mouth daily.    [provider]  [place] Lanius 500 MG CAPS Take 1 capsule by mouth daily.    [provider]  levothyroxine (SYNTHROID, LEVOTHROID) 125 MCG tablet Take 125 mcg daily before breakfast by mouth.    [provider]  Liniments (PENETRAN PLUS EX) Apply 1 application topically 3 (three) times daily as needed (for joint pain).    [provider]  losartan (COZAAR) 25 MG tablet Take 1 tablet by mouth daily. 08/20/16   [provider]  magnesium oxide (MAG-OX) 400 MG tablet Take 400 mg by mouth at bedtime.    [provider]  Melatonin 5 MG TABS Take 1 tablet by mouth at bedtime.    [provider]  Menthol, Topical Analgesic, (BIOFREEZE) 4 % GEL Apply 1 application topically 3 (three) times daily as needed (for joint pain).    [provider]  modafinil (PROVIGIL) 200 MG tablet Take 200 mg by mouth daily. 08/26/14   [provider]  Multiple Vitamins-Minerals (MULTIVITAMIN PO) Take 1 tablet by mouth every morning.    [provider]  Omeprazole 20 MG TBEC Take 1 tablet by mouth daily.    [provider]  oxybutynin (DITROPAN-XL) 5 MG 24 hr tablet Take 5 mg 2 (two) times daily by mouth.     [provider]  RA KRILL OIL 500 MG CAPS Take 1 capsule by mouth daily. Reported on 04/08/2015 04/01/14   [provider]  senna-docusate (SENNA-TIME S) 8.6-50 MG tablet Take 1 tablet by mouth daily.    [provider]  spironolactone (ALDACTONE) 25 MG tablet Take 25 mg by mouth daily. 03/06/14   [provider]  torsemide (DEMADEX) 20 MG tablet Take 20 mg daily by mouth. 10/20/16   [provider]  traMADol (ULTRAM) 50 MG tablet Take 50 mg by mouth every 6 (six) hours as needed for moderate pain.    [provider]  vitamin C (ASCORBIC ACID) 500 MG tablet Take 1,000 mg by mouth every morning. Reported on 04/08/2015    [provider]   Physical Exam: Vitals:   06/23/18 1747 06/23/18 1800 06/23/18 1815 06/23/18 1830  BP: 137/84 (!) 146/88 (!) 149/75 (!) 151/66  Pulse: 95 95 94 96  Resp: 18 (!) 27 (!) 26 20  Temp: 97.6 F (36.4 C)  TempSrc: Axillary     SpO2: 100% 99% 100% 99%  Weight:      Height:        Wt Readings from Last 3 Encounters:  06/23/18 82.6 kg  03/01/18 87.5 kg  12/01/16 85 kg    Constitutional pale appearing, elderly-appearing female, no distress Eyes: EOMI, anicteric, normal conjunctivae ENMT: Oropharynx with moist mucous membranes, normal dentition Neck: FROM,  Cardiovascular: Slightly tachycardic, no MRGs, with pitting edema of ankles bilaterally Respiratory: Seems to have some conversational dyspnea but no overt accessory muscle usage, no appreciable crackles heard on exam but limited by inability to sit up due to weakness,  Abdomen: Soft, some slight discomfort in left lower quadrant, otherwise no tenderness with deep palpation, no rebound tenderness or guarding Musculoskeletal: pain in right shoulder (chronic) Skin: No rash ulcers, or lesions. Without skin tenting  Neurologic: Grossly no focal neuro deficit. Psychiatric:Appropriate affect, and mood. Mental status alert, oriented to self, place, context          Labs on Admission:  Basic Metabolic Panel: Recent Labs  Lab 06/23/18 1547  NA 136  K 4.3  CL 102  CO2 23  GLUCOSE 127*  BUN 21  CREATININE 1.29*  CALCIUM 10.5*   Liver Function Tests: Recent Labs  Lab 06/23/18 1547  AST 42*  ALT 42  ALKPHOS 111  BILITOT 0.3  PROT 5.8*  ALBUMIN 2.4*   No results for input(s): LIPASE, AMYLASE in the last 168 hours. No results for input(s): AMMONIA in the last 168 hours. CBC: Recent Labs  Lab 06/23/18 1555  WBC 14.7*  NEUTROABS 8.8*  HGB 7.2*  HCT 23.7*  MCV 88.8  PLT 462*   Cardiac Enzymes: No results for input(s): CKTOTAL, CKMB, CKMBINDEX, TROPONINI in the last 168 hours.  BNP (last 3  results) No results for input(s): BNP in the last 8760 hours.  ProBNP (last 3 results) No results for input(s): PROBNP in the last 8760 hours.  CBG: No results for input(s): GLUCAP in the last 168 hours.  Radiological Exams on Admission: No results found.  EKG: Independently reviewed. normal EKG, normal sinus rhythm, unchanged from previous tracings, normal sinus rhythm.  Assessment/Plan Active Problems:   Obstructive sleep apnea   Hypothyroid   Hypertension   GERD (gastroesophageal reflux disease)   Coronary artery disease involving native coronary artery of native heart without angina pectoris   Chronic combined systolic and diastolic heart failure, NYHA class 2 (HCC)   Symptomatic anemia   1. Dyspnea.  Likely multifactorial etiology including symptomatic anemia and CHFrEF exacerbation.  On admission hemoglobin found to be 7 (downtrending from 8 over the past few days).  Patient noted to have conversational dyspnea during my evaluation which prompted me to order chest x-ray which showed mild pulmonary edema without pleural effusion, no O2 requirement, mild edema in bilateral ankles.  -Give one time IV lasix for diuresis, reassess in am to avoid lowering BP while working up for potential bleed -daily weights, fluid restriction, low sodium diet, strict intake and output -hold home oral torsemide, continue losartan and coreg -last TTE in 08/2017, mild edema, can consider repeat TTE if no significant improvement with diuresis to evaluate EF.   2. Symptomatic acute on chronic anemia.  Presenting symptom of worsening fatigue over several weeks with slow drop in hemoglobin from 11--8--7.  Reports of melena (patient takes iron), dark brown stool on ED physician exam and Hemoccult positive.  Patient is hemodynamically stable.  -Will maintain 2 peripheral IV lines,  -  Initiate IV Protonix twice daily low suspicious for upper GI bleed given chronic iron therapy,  -Will give 1 unit packed red  blood cells given systolic CHF history,  -Trend CBC daily.  -given stable BP and concern for CHF flare will stop IVF and monitor closely  -Consult GI in a.m. given hemodynamic stability.  Patient seems interested in pursuing  endoscopic evaluation, explained to patient given age risk may outweigh benefit especially if  supportive care improves symptoms.  3. Chronic iron deficiency anemia takes iron as outpatient. Iron panel consistent with persistent iron deficiency.  Continue oral iron here.  May benefit from IV iron dosing.  Patient would like endoscopic evaluation by GI.  4. Chronic asymptomatic hypercalcemia, reported history of hyperparathyroidism, stable at baseline (10.5), monitor CMP  5. Leukocytosis, chronic. Remains afebrile. Previously being treated for UTI at her facility, completed 5 out of 7 days of rocephin. Denies any urinary complaints here. Blood cultures if febrile  6. CAD, asymptomatic.  Continue to monitor on telemetry  7. CKD stage III.  Creatinine stable at baseline.  Continue losartan, avoid nephrotoxins, monitor output, monitor BMP daily 8. Depression, stable. Home cymbalta  9. Gout, stable. Home allopurinol   Consultants: none Code Status: DNR, confirmed on day of admission  DVT Prophylaxis:SCDs Family Communication: called  Tamela OddiListed Ann Steigner at [phone] but no answer Disposition Plan: Admit as observation as patient receiving blood transfusion for symptomatic anemia and IV diuresis for mild chf exacerbation, pending response may be here for less than 2 midnights.      Laverna PeaceShayla D  MD [facility]  Pager [phone]  If 7PM-7AM, please contact night-coverage [email or web address] Password [email or web address]  06/23/2018, 7:04 PM

## 2018-06-23 NOTE — ED Provider Notes (Signed)
[facility]   CSN: [id number] Arrival date & time:       History   Chief Complaint Chief Complaint  Patient presents with  . Weakness    HPI Bailey Duke is a 85 y.o. female.     Patient c/o feeling generally weak, and periods of feeling fatigued, in the past few weeks. Symptoms gradual onset, moderate, persistent, at times w standing. Denies syncope. No chest pain or sob. Denies recent blood loss. Stools are dark in color, does take iron. Pt indicates recently had outpatient labs with hemoglobin 7 and was told to go to ER by her doctor. Pt denies cough or uri symptoms. No fever or chills.   The history is provided by the patient and the EMS personnel.    Past Medical History:  Diagnosis Date  . B12 deficiency anemia   . Benign paroxysmal vertigo   . Chronic combined systolic and diastolic CHF, NYHA class 2 (HCC)    On spironolactone and torsemide; losartan and carvedilol at low-dose.  . Chronic kidney disease, stage III (moderate) (HCC)   . Coronary artery disease, occlusive 07/2014   LAD 100% subtotal CTO with left to left collaterals.  Mild disease elsewhere. --Not thought to be PCI amenable due to aneurysmal anterior wall  . Essential hypertension   . GERD (gastroesophageal reflux disease)   . Hyperlipidemia   . Hypersomnia   . Hypertension   . Hypothyroid   . Ischemic cardiomyopathy 07/2014   Occluded LAD with initial EF of 30 to 35% and anterior to apical akinesis.  Most recent EF (August 2019) was 40 to 45%.  Marland Kitchen. OAB (overactive bladder)   . Osteoarthritis   . Proliferative retinopathy of left eye    Non-Diabetic  . Sleep apnea    on CPAP qhs  . Spinal stenosis   . Systolic CHF (HCC)   . Vitamin D deficiency     Patient Active Problem List   Diagnosis Date Noted  . Coronary artery disease involving native coronary artery of native heart without angina pectoris 03/08/2018  . Chronic combined systolic and  diastolic heart failure, NYHA class 2 (HCC) 03/08/2018  . Ischemic cardiomyopathy 03/08/2018  . Metabolic encephalopathy 12/01/2016  . Acute lower UTI 12/01/2016  . Acute encephalopathy 12/01/2016  . Lower urinary tract infection   . Dementia (HCC) 09/15/2014  . TIA (transient ischemic attack) 01/24/2013  . Hypertension   . Depressive disorder   . Anxiety   . GERD (gastroesophageal reflux disease)   . Allergic rhinitis due to pollen 01/04/2011  . Allergic conjunctivitis 01/04/2011  . Obstructive sleep apnea 01/04/2011  . Hypothyroid 01/04/2011    Past Surgical History:  Procedure Laterality Date  . adenoidectomy    . Bilateral total hip replacements    . Bilateral Total Knee replacements    . CATARACT EXTRACTION, BILATERAL    . COLECTOMY    . COLON SURGERY    . COLONOSCOPY    . Cyst and partial ovary removed    . eyebrow and eyelid surgery    . JOINT REPLACEMENT    . LEFT HEART CATH AND CORONARY ANGIOGRAPHY  07/2014   [place] ([facility]): LMCA: Mild luminal irregularities <20%.  LAD: 100% ( sub totaled) L--L collaterals. LCx: Mild luminal irregularities <20%, RCA: Mild luminal irregularities <20%. EF ~30% anterior-apical Akinesis. --> sent for viability assessment. --> felt that the anterior wall is ~ aneurysmal & likely mostly Infarct.  . LUMBAR DISC SURGERY    .  NM MYOVIEW LTD  05/2016   [facility]: Large anterior apical infarct. Difficult to exclude mild peri-infract ischemia.  EF ~32% - paradoxical septum. Read as High Risk b/c size of defect & low EF.  . Right Wrist and hand surgery    . STAPEDECTOMY    . TRANSTHORACIC ECHOCARDIOGRAM  08/2017   [facility]: EF 40-45%. Akinetic Anteroapical wall.  Mild MR/TR.;; TTE December 16, 2016: EF 35-40%.  Anteroseptal, mid-distal anterior wall and apex akinetic and thinned consistent with old infarct.  Mild MR and TR.  No significant change from May 2018.  Marland Kitchen Uterus polypectomy    . Varicose Vein Surgery on left        OB History   No obstetric history on file.      Home Medications    Prior to Admission medications   Medication Sig Start Date End Date Taking? Authorizing Provider  acetaminophen (TYLENOL) 325 MG tablet Take 650 mg by mouth every 4 (four) hours as needed for mild pain or fever.    [provider]  allopurinol (ZYLOPRIM) 100 MG tablet Take 1 tablet by mouth daily. 02/10/18   [provider]  atorvastatin (LIPITOR) 40 MG tablet Take 40 mg daily by mouth.    [provider]  carvedilol (COREG) 3.125 MG tablet Take 1 tablet by mouth 2 (two) times daily. 10/07/16   [provider]  Cholecalciferol (VITAMIN D) 2000 UNITS CAPS Take 3,000 Units every morning by mouth.     [provider]  Cyanocobalamin (VITAMIN B-12 IJ) Inject 1,000 mcg as directed every 30 (thirty) days.    [provider]  diclofenac sodium (VOLTAREN) 1 % GEL Apply 4 g topically every 6 (six) hours as needed (for lumbosacral spondylosis).    [provider]  Ferrous Sulfate (SLOW FE) 142 (45 Fe) MG TBCR Take 1 tablet by mouth 2 (two) times daily.    [provider]  fexofenadine (ALLEGRA) 60 MG tablet Take 60 mg by mouth daily.    [provider]  gabapentin (NEURONTIN) 100 MG capsule Take 2 capsules by mouth at bedtime. 02/10/18   [provider]  glucosamine-chondroitin 500-400 MG tablet Take 1 tablet by mouth daily.    [provider]  [place] Lanius 500 MG CAPS Take 1 capsule by mouth daily.    [provider]  levothyroxine (SYNTHROID, LEVOTHROID) 125 MCG tablet Take 125 mcg daily before breakfast by mouth.    [provider]  Liniments (PENETRAN PLUS EX) Apply 1 application topically 3 (three) times daily as needed (for joint pain).    [provider]  losartan (COZAAR) 25 MG tablet Take 1 tablet by mouth daily. 08/20/16   [provider]  magnesium oxide (MAG-OX) 400 MG tablet Take 400 mg by  mouth at bedtime.    [provider]  Melatonin 5 MG TABS Take 1 tablet by mouth at bedtime.    [provider]  Menthol, Topical Analgesic, (BIOFREEZE) 4 % GEL Apply 1 application topically 3 (three) times daily as needed (for joint pain).    [provider]  modafinil (PROVIGIL) 200 MG tablet Take 200 mg by mouth daily. 08/26/14   [provider]  Multiple Vitamins-Minerals (MULTIVITAMIN PO) Take 1 tablet by mouth every morning.    [provider]  Omeprazole 20 MG TBEC Take 1 tablet by mouth daily.    [provider]  oxybutynin (DITROPAN-XL) 5 MG 24 hr tablet Take 5 mg 2 (two) times daily  by mouth.     [provider]  RA KRILL OIL 500 MG CAPS Take 1 capsule by mouth daily. Reported on 04/08/2015 04/01/14   [provider]  senna-docusate (SENNA-TIME S) 8.6-50 MG tablet Take 1 tablet by mouth daily.    [provider]  spironolactone (ALDACTONE) 25 MG tablet Take 25 mg by mouth daily. 03/06/14   [provider]  torsemide (DEMADEX) 20 MG tablet Take 20 mg daily by mouth. 10/20/16   [provider]  traMADol (ULTRAM) 50 MG tablet Take 50 mg by mouth every 6 (six) hours as needed for moderate pain.    [provider]  vitamin C (ASCORBIC ACID) 500 MG tablet Take 1,000 mg by mouth every morning. Reported on 04/08/2015    [provider]    Family History Family History  Problem Relation Age of Onset  . Breast cancer Mother   . Diabetes Mother   . Other Father        Hardening of the arteries to the head  . Stroke Father   . Congestive Heart Failure Maternal Grandmother   . Diabetes Maternal Grandmother     Social History Social History   Tobacco Use  . Smoking status: Never Smoker  . Smokeless tobacco: Never Used  Substance Use Topics  . Alcohol use: Yes    Alcohol/week: 2.0 standard drinks    Types: 2 Shots of liquor per week    Comment: rum  . Drug use: No      Allergies   Tetanus toxoids; Penicillins; Procaine; and Sulfa antibiotics   Review of Systems Review of Systems  Constitutional: Negative for fever.  HENT: Negative for sore throat.   Eyes: Negative for redness.  Respiratory: Negative for shortness of breath.   Cardiovascular: Negative for chest pain.  Gastrointestinal: Negative for abdominal pain, blood in stool, diarrhea and vomiting.  Endocrine: Negative for polyuria.  Genitourinary: Negative for flank pain, hematuria and vaginal bleeding.  Musculoskeletal: Negative for back pain and neck pain.  Skin: Negative for rash.  Neurological: Negative for headaches.  Hematological: Does not bruise/bleed easily.  Psychiatric/Behavioral: Negative for confusion.     Physical Exam Updated Vital Signs BP 135/62 (BP Location: Right Arm)   Pulse 91   Temp 98.3 F (36.8 C) (Oral)   Resp (!) 24   Ht 1.6 m ( )   Wt 82.6 kg   SpO2 100%   BMI 32.24 kg/m   Physical Exam Vitals signs and nursing note reviewed.  Constitutional:      Appearance: Normal appearance. She is well-developed.  HENT:     Head: Atraumatic.     Nose: Nose normal.     Mouth/Throat:     Mouth: Mucous membranes are moist.  Eyes:     General: No scleral icterus.    Conjunctiva/sclera: Conjunctivae normal.     Pupils: Pupils are equal, round, and reactive to light.  Neck:     Musculoskeletal: Normal range of motion and neck supple. No neck rigidity or muscular tenderness.     Vascular: No carotid bruit.     Trachea: No tracheal deviation.  Cardiovascular:     Rate and Rhythm: Normal rate and regular rhythm.     Pulses: Normal pulses.     Heart sounds: Normal heart sounds. No murmur. No friction rub. No gallop.   Pulmonary:     Effort: Pulmonary effort is normal. No respiratory distress.     Breath sounds: Normal breath sounds.  Abdominal:  General: Bowel sounds are normal. There is no distension.     Palpations: Abdomen is soft. There is no  mass.     Tenderness: There is no abdominal tenderness. There is no guarding or rebound.     Hernia: No hernia is present.  Genitourinary:    Comments: No cva tenderness. Very dark brown stool - sent for hemoccult testing.  Musculoskeletal:        General: No swelling.  Skin:    General: Skin is warm and dry.     Findings: No rash.  Neurological:     Mental Status: She is alert.     Comments: Alert, speech fluent. Motor/sens grossly intact bil.   Psychiatric:        Mood and Affect: Mood normal.      ED Treatments / Results  Labs (all labs ordered are listed, but only abnormal results are displayed) Results for orders placed or performed during the hospital encounter of 06/23/18  Comprehensive metabolic panel  Result Value Ref Range   Sodium 136 135 - 145 mmol/L   Potassium 4.3 3.5 - 5.1 mmol/L   Chloride 102 98 - 111 mmol/L   CO2 23 22 - 32 mmol/L   Glucose, Bld 127 (H) 70 - 99 mg/dL   BUN 21 8 - 23 mg/dL   Creatinine, Ser 1.61 (H) 0.44 - 1.00 mg/dL   Calcium 09.6 (H) 8.9 - 10.3 mg/dL   Total Protein 5.8 (L) 6.5 - 8.1 g/dL   Albumin 2.4 (L) 3.5 - 5.0 g/dL   AST 42 (H) 15 - 41 U/L   ALT 42 0 - 44 U/L   Alkaline Phosphatase 111 38 - 126 U/L   Total Bilirubin 0.3 0.3 - 1.2 mg/dL   GFR calc non Af Amer 36 (L) >60 mL/min   GFR calc Af Amer 42 (L) >60 mL/min   Anion gap 11 5 - 15  Protime-INR  Result Value Ref Range   Prothrombin Time 14.3 11.4 - 15.2 seconds   INR 1.1 0.8 - 1.2  Ethanol  Result Value Ref Range   Alcohol, Ethyl (B) <10 <10 mg/dL  Vitamin E45  Result Value Ref Range   Vitamin B-12 933 (H) 180 - 914 pg/mL  Iron and TIBC  Result Value Ref Range   Iron 13 (L) 28 - 170 ug/dL   TIBC 409 811 - 914 ug/dL   Saturation Ratios 5 (L) 10.4 - 31.8 %   UIBC 254 ug/dL  Ferritin  Result Value Ref Range   Ferritin 55 11 - 307 ng/mL  Reticulocytes  Result Value Ref Range   Retic Ct Pct 2.7 0.4 - 3.1 %   RBC. 2.69 (L) 3.87 - 5.11 MIL/uL   Retic Count, Absolute  71.3 19.0 - 186.0 K/uL   Immature Retic Fract 26.3 (H) 2.3 - 15.9 %  CBC with Differential/Platelet  Result Value Ref Range   WBC 14.7 (H) 4.0 - 10.5 K/uL   RBC 2.67 (L) 3.87 - 5.11 MIL/uL   Hemoglobin 7.2 (L) 12.0 - 15.0 g/dL   HCT 78.2 (L) 95.6 - 21.3 %   MCV 88.8 80.0 - 100.0 fL   MCH 27.0 26.0 - 34.0 pg   MCHC 30.4 30.0 - 36.0 g/dL   RDW 08.6 57.8 - 46.9 %   Platelets 462 (H) 150 - 400 K/uL   nRBC 0.0 0.0 - 0.2 %   Neutrophils Relative % 59 %   Neutro Abs 8.8 (H) 1.7 - 7.7 K/uL  Lymphocytes Relative 28 %   Lymphs Abs 4.1 (H) 0.7 - 4.0 K/uL   Monocytes Relative 10 %   Monocytes Absolute 1.5 (H) 0.1 - 1.0 K/uL   Eosinophils Relative 2 %   Eosinophils Absolute 0.2 0.0 - 0.5 K/uL   Basophils Relative 0 %   Basophils Absolute 0.0 0.0 - 0.1 K/uL   Immature Granulocytes 1 %   Abs Immature Granulocytes 0.07 0.00 - 0.07 K/uL  POC occult blood, ED  Result Value Ref Range   Fecal Occult Bld POSITIVE (A) NEGATIVE  Type and screen  Result Value Ref Range   ABO/RH(D) O POS    Antibody Screen NEG    Sample Expiration 06/26/2018,2359    Unit Number [id number]    Blood Component Type RBC, LR IRR    Unit division 00    Status of Unit ALLOCATED    Transfusion Status OK TO TRANSFUSE    Crossmatch Result      Compatible Performed at [facility], [address], [place], [place] [address]   Prepare RBC  Result Value Ref Range   Order Confirmation      ORDER PROCESSED BY BLOOD BANK Performed at [facility], [address], [place], [place] [address]   ABO/Rh  Result Value Ref Range   ABO/RH(D)      O POS Performed at [facility], [address] [place], [place] [address]   BPAM [facility]  Result Value Ref Range   Blood Product Unit Number [id number]    PRODUCT CODE [id number]    Unit Type and Rh 5100    Blood Product Expiration Date 841660630160     EKG EKG Interpretation  Date/Time:  Friday Jun 23 2018 14:40:00 EDT Ventricular Rate:  92 PR  Interval:    QRS Duration: 91 QT Interval:  373 QTC Calculation: 462 R Axis:   -14 Text Interpretation:  Sinus rhythm Anterior infarct, old Borderline repolarization abnormality since last tracing no significant change Confirmed by Eber Hong [phone]) on 06/23/2018 2:44:03 PM   Radiology No results found.  Procedures Procedures (including critical care time)  Medications Ordered in ED Medications - No data to display   Initial Impression / Assessment and Plan / ED Course  I have reviewed the triage vital signs and the nursing notes.  Pertinent labs & imaging results that were available during my care of the patient were reviewed by me and considered in my medical decision making (see chart for details).  Iv ns. Labs sent.   Reviewed nursing notes and prior charts for additional history. Outpatient labs from 5/18 - hemoglobin 7.3 then.   Labs reviewed by me - hgb 7.  Given symptomatic, hgb 7, heme pos stools - will transfuse prbc/admit.  Medicine service consulted for admission.    Final Clinical Impressions(s) / ED Diagnoses   Final diagnoses:  None    ED Discharge Orders    None       Cathren Laine, MD 06/23/18 1700

## 2018-06-23 NOTE — ED Notes (Signed)
Pt made a few calls and spoke with Power of Flippin and family/friends

## 2018-06-23 NOTE — ED Triage Notes (Signed)
To ED via [facility] from [facility] (states has been in solitary confinement at home) has not been tested for covid.  To ED today because her doctor called and told to come here -- hemaglobin is low and wbc is high. Pt is pale, but alert, oriented x 4, denies having bleeding in stool, states stool is black "but I take iron" -- denies vomiting blood.

## 2018-06-23 NOTE — ED Provider Notes (Signed)
MSE was initiated and I personally evaluated the patient and placed orders (if any) at  2:41 PM on Jun 23, 2018.  The patient appears stable so that the remainder of the MSE may be completed by another provider.  The patient is a very pleasant 85 year old female who was sent here from a nursing facility because of progressive anemia, she has had multiple lab test done over the last couple of weeks which show that her hemoglobin is dropping and has become as low as 7.3.  She does feel like she is pale, bloated, mildly swollen in the legs and has had some black stools.  She reports having black stools in the past but states that even though they were positive for blood this was not pursued.  She states she is still having black stools.  She denies any other symptoms at this time though sometimes he does feel little short of breath she does not actively feel short of breath.  On my exam she has minimal edema, abdomen is nontender, lungs are clear, heart is clear, no distress.  The patient is stable for evaluation by the next provider to arrive in 20 minutes   Eber Hong, MD 06/23/18 1442

## 2018-06-23 NOTE — ED Notes (Addendum)
ED TO INPATIENT HANDOFF REPORT  ED Nurse Name and Phone #: [phone]  S Name/Age/Gender Macario Carls 85 y.o. female Room/Bed: 038C/038C  Code Status   Code Status: Prior  Home/SNF/Other Assisted Living Patient oriented to: self, place, time and situation Is this baseline? Yes   Triage Complete: Triage complete  Chief Complaint Abnormal Labs  Triage Note To ED via [facility] from [facility] (states has been in solitary confinement at home) has not been tested for covid.  To ED today because her doctor called and told to come here -- hemaglobin is low and wbc is high. Pt is pale, but alert, oriented x 4, denies having bleeding in stool, states stool is black "but I take iron" -- denies vomiting blood.    Allergies Allergies  Allergen Reactions  . Tetanus Toxoids Other (See Comments)    RED LINE FROM INJECTION SITE DOWN ARM  . Penicillins Other (See Comments)    1940 FORMS - TINY ITCHY PIMPLES ON FINGERS Did it involve swelling of the face/tongue/throat, SOB, or low BP? Unknown Did it involve sudden or severe rash/hives, skin peeling, or any reaction on the inside of your mouth or nose? Unknown Did you need to seek medical attention at a hospital or doctor's office? Unknown When did it last happen?1940? If all above answers are "NO", may proceed with cephalosporin use.  . Procaine Other (See Comments)    Unknown reaction  . Sulfa Antibiotics Other (See Comments)    Unknown reaction    Level of Care/Admitting Diagnosis ED Disposition    ED Disposition Condition Comment   Admit  Hospital [facility]: [facility] [[id number]]  Level of Care: Telemetry Cardiac [103]  I expect the patient will be discharged within 24 hours: No (not a candidate for 5C-Observation unit)  Covid Evaluation: Screening Protocol (No Symptoms)  Diagnosis: Symptomatic anemia [8469629]  Admitting Physician: Laverna Peace [[id number]]  Attending Physician: Laverna Peace [phone]  PT  Class (Do Not Modify): Observation [104]  PT Acc Code (Do Not Modify): Observation [10022]       B Medical/Surgery History Past Medical History:  Diagnosis Date  . B12 deficiency anemia   . Benign paroxysmal vertigo   . Chronic combined systolic and diastolic CHF, NYHA class 2 (HCC)    On spironolactone and torsemide; losartan and carvedilol at low-dose.  . Chronic kidney disease, stage III (moderate) (HCC)   . Coronary artery disease, occlusive 07/2014   LAD 100% subtotal CTO with left to left collaterals.  Mild disease elsewhere. --Not thought to be PCI amenable due to aneurysmal anterior wall  . Essential hypertension   . GERD (gastroesophageal reflux disease)   . Hyperlipidemia   . Hypersomnia   . Hypertension   . Hypothyroid   . Ischemic cardiomyopathy 07/2014   Occluded LAD with initial EF of 30 to 35% and anterior to apical akinesis.  Most recent EF (August 2019) was 40 to 45%.  Marland Kitchen OAB (overactive bladder)   . Osteoarthritis   . Proliferative retinopathy of left eye    Non-Diabetic  . Sleep apnea    on CPAP qhs  . Spinal stenosis   . Systolic CHF (HCC)   . Vitamin D deficiency    Past Surgical History:  Procedure Laterality Date  . adenoidectomy    . Bilateral total hip replacements    . Bilateral Total Knee replacements    . CATARACT EXTRACTION, BILATERAL    . COLECTOMY    . COLON SURGERY    .  COLONOSCOPY    . Cyst and partial ovary removed    . eyebrow and eyelid surgery    . JOINT REPLACEMENT    . LEFT HEART CATH AND CORONARY ANGIOGRAPHY  07/2014   [place] ([facility]): LMCA: Mild luminal irregularities <20%.  LAD: 100% ( sub totaled) L--L collaterals. LCx: Mild luminal irregularities <20%, RCA: Mild luminal irregularities <20%. EF ~30% anterior-apical Akinesis. --> sent for viability assessment. --> felt that the anterior wall is ~ aneurysmal & likely mostly Infarct.  . LUMBAR DISC SURGERY    . NM MYOVIEW LTD  05/2016   [facility] [facility]: Large anterior  apical infarct. Difficult to exclude mild peri-infract ischemia.  EF ~32% - paradoxical septum. Read as High Risk b/c size of defect & low EF.  . Right Wrist and hand surgery    . STAPEDECTOMY    . TRANSTHORACIC ECHOCARDIOGRAM  08/2017   [facility]: EF 40-45%. Akinetic Anteroapical wall.  Mild MR/TR.;; TTE December 16, 2016: EF 35-40%.  Anteroseptal, mid-distal anterior wall and apex akinetic and thinned consistent with old infarct.  Mild MR and TR.  No significant change from May 2018.  Marland Kitchen Uterus polypectomy    . Varicose Vein Surgery on left       A IV Location/Drains/Wounds Patient Lines/Drains/Airways Status   Active Line/Drains/Airways    Name:   Placement date:   Placement time:   Site:   Days:   Peripheral IV 12/01/16 Right Antecubital   12/01/16    1927    Antecubital   569   Peripheral IV 06/23/18 Left;Anterior Forearm   06/23/18    1646    Forearm   less than 1          Intake/Output Last 24 hours No intake or output data in the 24 hours ending 06/23/18 1913  Labs/Imaging Results for orders placed or performed during the hospital encounter of 06/23/18 (from the past 48 hour(s))  POC occult blood, ED     Status: Abnormal   Collection Time: 06/23/18  3:20 PM  Result Value Ref Range   Fecal Occult Bld POSITIVE (A) NEGATIVE  Type and screen     Status: None (Preliminary result)   Collection Time: 06/23/18  3:42 PM  Result Value Ref Range   ABO/RH(D) O POS    Antibody Screen NEG    Sample Expiration 06/26/2018,2359    Unit Number [id number]    Blood Component Type RBC, LR IRR    Unit division 00    Status of Unit ISSUED    Transfusion Status OK TO TRANSFUSE    Crossmatch Result      Compatible Performed at [facility], [address] [place], [place] [address]   ABO/Rh     Status: None   Collection Time: 06/23/18  3:42 PM  Result Value Ref Range   ABO/RH(D)      O POS Performed at [facility], [address] [place], [place] [address]    Comprehensive metabolic panel     Status: Abnormal   Collection Time: 06/23/18  3:47 PM  Result Value Ref Range   Sodium 136 135 - 145 mmol/L   Potassium 4.3 3.5 - 5.1 mmol/L   Chloride 102 98 - 111 mmol/L   CO2 23 22 - 32 mmol/L   Glucose, Bld 127 (H) 70 - 99 mg/dL   BUN 21 8 - 23 mg/dL   Creatinine, Ser 5.72 (H) 0.44 - 1.00 mg/dL   Calcium 62.0 (H)  8.9 - 10.3 mg/dL   Total Protein 5.8 (L) 6.5 - 8.1 g/dL   Albumin 2.4 (L) 3.5 - 5.0 g/dL   AST 42 (H) 15 - 41 U/L   ALT 42 0 - 44 U/L   Alkaline Phosphatase 111 38 - 126 U/L   Total Bilirubin 0.3 0.3 - 1.2 mg/dL   GFR calc non Af Amer 36 (L) >60 mL/min   GFR calc Af Amer 42 (L) >60 mL/min   Anion gap 11 5 - 15    Comment: Performed at [facility], [address], [place], [place] [address]  Protime-INR     Status: None   Collection Time: 06/23/18  3:47 PM  Result Value Ref Range   Prothrombin Time 14.3 11.4 - 15.2 seconds   INR 1.1 0.8 - 1.2    Comment: (NOTE) INR goal varies based on device and disease states. Performed at [facility], [address] [place], [place] [address]   Ethanol     Status: None   Collection Time: 06/23/18  3:47 PM  Result Value Ref Range   Alcohol, Ethyl (B) <10 <10 mg/dL    Comment: (NOTE) Lowest detectable limit for serum alcohol is 10 mg/dL. For medical purposes only. Performed at [facility], [address] [place], [place] [address]   Vitamin B12     Status: Abnormal   Collection Time: 06/23/18  3:47 PM  Result Value Ref Range   Vitamin B-12 933 (H) 180 - 914 pg/mL    Comment: (NOTE) This assay is not validated for testing neonatal or myeloproliferative syndrome specimens for Vitamin B12 levels. Performed at [facility], [address], [place], [place] [address]   Folate     Status: None   Collection Time: 06/23/18  3:47 PM  Result Value Ref Range   Folate 36.5 >5.9 ng/mL    Comment: Performed at [facility], [address], [place], [place]  [address]  Iron and TIBC     Status: Abnormal   Collection Time: 06/23/18  3:47 PM  Result Value Ref Range   Iron 13 (L) 28 - 170 ug/dL   TIBC 130 865 - 784 ug/dL   Saturation Ratios 5 (L) 10.4 - 31.8 %   UIBC 254 ug/dL    Comment: Performed at [facility], [address], [place], [place] [address]  Ferritin     Status: None   Collection Time: 06/23/18  3:47 PM  Result Value Ref Range   Ferritin 55 11 - 307 ng/mL    Comment: Performed at [facility], [address] [place], [place] [address]  Reticulocytes     Status: Abnormal   Collection Time: 06/23/18  3:47 PM  Result Value Ref Range   Retic Ct Pct 2.7 0.4 - 3.1 %   RBC. 2.69 (L) 3.87 - 5.11 MIL/uL   Retic Count, Absolute 71.3 19.0 - 186.0 K/uL   Immature Retic Fract 26.3 (H) 2.3 - 15.9 %    Comment: Performed at [facility], [address] [place], [place] [address]  CBC with Differential/Platelet     Status: Abnormal   Collection Time: 06/23/18  3:55 PM  Result Value Ref Range   WBC 14.7 (H) 4.0 - 10.5 K/uL   RBC 2.67 (L) 3.87 - 5.11 MIL/uL   Hemoglobin 7.2 (L) 12.0 - 15.0 g/dL   HCT 10.2 (L) 72.5 - 36.6 %   MCV  88.8 80.0 - 100.0 fL   MCH 27.0 26.0 - 34.0 pg   MCHC 30.4 30.0 - 36.0 g/dL   RDW 40.915.2 81.111.5 - 91.415.5 %   Platelets 462 (H) 150 - 400 K/uL   nRBC 0.0 0.0 - 0.2 %   Neutrophils Relative % 59 %   Neutro Abs 8.8 (H) 1.7 - 7.7 K/uL   Lymphocytes Relative 28 %   Lymphs Abs 4.1 (H) 0.7 - 4.0 K/uL   Monocytes Relative 10 %   Monocytes Absolute 1.5 (H) 0.1 - 1.0 K/uL   Eosinophils Relative 2 %   Eosinophils Absolute 0.2 0.0 - 0.5 K/uL   Basophils Relative 0 %   Basophils Absolute 0.0 0.0 - 0.1 K/uL   Immature Granulocytes 1 %   Abs Immature Granulocytes 0.07 0.00 - 0.07 K/uL    Comment: Performed at [facility], [address] [place], [place] [address]  Prepare RBC     Status: None   Collection Time: 06/23/18  4:07 PM  Result Value Ref Range   Order Confirmation      ORDER PROCESSED  BY BLOOD BANK Performed at [facility], [address] [place], [place] [address]   SARS Coronavirus 2 (CEPHEID - Performed in [facility] hospital lab), Hosp Order     Status: None   Collection Time: 06/23/18  5:29 PM  Result Value Ref Range   SARS Coronavirus 2 NEGATIVE NEGATIVE    Comment: (NOTE) If result is NEGATIVE SARS-CoV-2 target nucleic acids are NOT DETECTED. The SARS-CoV-2 RNA is generally detectable in upper and lower  respiratory specimens during the acute phase of infection. The lowest  concentration of SARS-CoV-2 viral copies this assay can detect is 250  copies / mL. A negative result does not preclude SARS-CoV-2 infection  and should not be used as the sole basis for treatment or other  patient management decisions.  A negative result may occur with  improper specimen collection / handling, submission of specimen other  than nasopharyngeal swab, presence of viral mutation(s) within the  areas targeted by this assay, and inadequate number of viral copies  (<250 copies / mL). A negative result must be combined with clinical  observations, patient history, and epidemiological information. If result is POSITIVE SARS-CoV-2 target nucleic acids are DETECTED. The SARS-CoV-2 RNA is generally detectable in upper and lower  respiratory specimens dur ing the acute phase of infection.  Positive  results are indicative of active infection with SARS-CoV-2.  Clinical  correlation with patient history and other diagnostic information is  necessary to determine patient infection status.  Positive results do  not rule out bacterial infection or co-infection with other viruses. If result is PRESUMPTIVE POSTIVE SARS-CoV-2 nucleic acids MAY BE PRESENT.   A presumptive positive result was obtained on the submitted specimen  and confirmed on repeat testing.  While 2019 novel coronavirus  (SARS-CoV-2) nucleic acids may be present in the submitted sample  additional confirmatory  testing may be necessary for epidemiological  and / or clinical management purposes  to differentiate between  SARS-CoV-2 and other Sarbecovirus currently known to infect humans.  If clinically indicated additional testing with an alternate test  methodology [phone]) is advised. The SARS-CoV-2 RNA is generally  detectable in upper and lower respiratory sp ecimens during the acute  phase of infection. The expected result is Negative. Fact Sheet for Patients:  BoilerBrush.com.cyhttps://www.fda.gov/media/136312/download Fact Sheet for Healthcare Providers: https://pope.com/https://www.fda.gov/media/136313/download This test is not yet approved or cleared by  the [employer] and has been authorized for detection and/or diagnosis of SARS-CoV-2 by FDA under an Emergency Use Authorization (EUA).  This EUA will remain in effect (meaning this test can be used) for the duration of the COVID-19 declaration under Section 564(b)(1) of the Act, 21 U.S.C. section 360bbb-3(b)(1), unless the authorization is terminated or revoked sooner. Performed at [facility], [address], [place], [place] [address]    No results found.  Pending Labs Unresulted Labs (From admission, onward)    Start     Ordered   06/23/18 1444  Urinalysis, Routine w reflex microscopic  Once,   R     06/23/18 1443   06/23/18 1443  CBC with Differential/Platelet  Once,   R     06/23/18 1443   Signed and Held  TSH  Add-on,   R     Signed and Held   Signed and Held  Magnesium  Add-on,   R     Signed and Held   Signed and Held  Comprehensive metabolic panel  Tomorrow morning,   R     Signed and Held   Signed and Held  CBC  Now then every 8 hours,   R     Signed and Held          Vitals/Pain Today's Vitals   06/23/18 1747 06/23/18 1800 06/23/18 1815 06/23/18 1830  BP: 137/84 (!) 146/88 (!) 149/75 (!) 151/66  Pulse: 95 95 94 96  Resp: 18 (!) 27 (!) 26 20  Temp: 97.6 F (36.4 C)     TempSrc: Axillary     SpO2: 100% 99% 100% 99%   Weight:      Height:      PainSc:        Isolation Precautions No active isolations  Medications Medications  0.9 %  sodium chloride infusion (has no administration in time range)    Mobility walks with person assist High fall risk   Focused Assessments Respiratory   R Recommendations: See Admitting Provider Note  Report given to:   Additional Notes:

## 2018-06-23 NOTE — ED Notes (Signed)
Deirdre Priest [phone]

## 2018-06-24 ENCOUNTER — Observation Stay (HOSPITAL_BASED_OUTPATIENT_CLINIC_OR_DEPARTMENT_OTHER): Payer: Medicare Other

## 2018-06-24 ENCOUNTER — Observation Stay (HOSPITAL_COMMUNITY): Payer: Medicare Other

## 2018-06-24 DIAGNOSIS — I361 Nonrheumatic tricuspid (valve) insufficiency: Secondary | ICD-10-CM | POA: Diagnosis not present

## 2018-06-24 DIAGNOSIS — R531 Weakness: Secondary | ICD-10-CM | POA: Diagnosis not present

## 2018-06-24 DIAGNOSIS — I5023 Acute on chronic systolic (congestive) heart failure: Secondary | ICD-10-CM

## 2018-06-24 DIAGNOSIS — I5021 Acute systolic (congestive) heart failure: Secondary | ICD-10-CM | POA: Diagnosis present

## 2018-06-24 DIAGNOSIS — H3522 Other non-diabetic proliferative retinopathy, left eye: Secondary | ICD-10-CM | POA: Diagnosis present

## 2018-06-24 DIAGNOSIS — I509 Heart failure, unspecified: Secondary | ICD-10-CM

## 2018-06-24 DIAGNOSIS — I2510 Atherosclerotic heart disease of native coronary artery without angina pectoris: Secondary | ICD-10-CM | POA: Diagnosis present

## 2018-06-24 DIAGNOSIS — E039 Hypothyroidism, unspecified: Secondary | ICD-10-CM | POA: Diagnosis present

## 2018-06-24 DIAGNOSIS — F329 Major depressive disorder, single episode, unspecified: Secondary | ICD-10-CM | POA: Diagnosis present

## 2018-06-24 DIAGNOSIS — K219 Gastro-esophageal reflux disease without esophagitis: Secondary | ICD-10-CM | POA: Diagnosis present

## 2018-06-24 DIAGNOSIS — Z9049 Acquired absence of other specified parts of digestive tract: Secondary | ICD-10-CM | POA: Diagnosis not present

## 2018-06-24 DIAGNOSIS — I255 Ischemic cardiomyopathy: Secondary | ICD-10-CM | POA: Diagnosis present

## 2018-06-24 DIAGNOSIS — N390 Urinary tract infection, site not specified: Secondary | ICD-10-CM | POA: Diagnosis present

## 2018-06-24 DIAGNOSIS — I340 Nonrheumatic mitral (valve) insufficiency: Secondary | ICD-10-CM | POA: Diagnosis not present

## 2018-06-24 DIAGNOSIS — G4733 Obstructive sleep apnea (adult) (pediatric): Secondary | ICD-10-CM | POA: Diagnosis present

## 2018-06-24 DIAGNOSIS — D649 Anemia, unspecified: Secondary | ICD-10-CM

## 2018-06-24 DIAGNOSIS — I5043 Acute on chronic combined systolic (congestive) and diastolic (congestive) heart failure: Secondary | ICD-10-CM | POA: Diagnosis present

## 2018-06-24 DIAGNOSIS — Z20828 Contact with and (suspected) exposure to other viral communicable diseases: Secondary | ICD-10-CM | POA: Diagnosis present

## 2018-06-24 DIAGNOSIS — M109 Gout, unspecified: Secondary | ICD-10-CM | POA: Diagnosis present

## 2018-06-24 DIAGNOSIS — E785 Hyperlipidemia, unspecified: Secondary | ICD-10-CM | POA: Diagnosis present

## 2018-06-24 DIAGNOSIS — N3281 Overactive bladder: Secondary | ICD-10-CM | POA: Diagnosis present

## 2018-06-24 DIAGNOSIS — N183 Chronic kidney disease, stage 3 (moderate): Secondary | ICD-10-CM | POA: Diagnosis present

## 2018-06-24 DIAGNOSIS — R195 Other fecal abnormalities: Secondary | ICD-10-CM | POA: Diagnosis present

## 2018-06-24 DIAGNOSIS — Z66 Do not resuscitate: Secondary | ICD-10-CM | POA: Diagnosis present

## 2018-06-24 DIAGNOSIS — J301 Allergic rhinitis due to pollen: Secondary | ICD-10-CM | POA: Diagnosis present

## 2018-06-24 DIAGNOSIS — M797 Fibromyalgia: Secondary | ICD-10-CM | POA: Diagnosis present

## 2018-06-24 DIAGNOSIS — E213 Hyperparathyroidism, unspecified: Secondary | ICD-10-CM | POA: Diagnosis present

## 2018-06-24 DIAGNOSIS — D509 Iron deficiency anemia, unspecified: Secondary | ICD-10-CM | POA: Diagnosis present

## 2018-06-24 DIAGNOSIS — Z96643 Presence of artificial hip joint, bilateral: Secondary | ICD-10-CM | POA: Diagnosis present

## 2018-06-24 DIAGNOSIS — I5042 Chronic combined systolic (congestive) and diastolic (congestive) heart failure: Secondary | ICD-10-CM | POA: Diagnosis not present

## 2018-06-24 DIAGNOSIS — I130 Hypertensive heart and chronic kidney disease with heart failure and stage 1 through stage 4 chronic kidney disease, or unspecified chronic kidney disease: Secondary | ICD-10-CM | POA: Diagnosis present

## 2018-06-24 DIAGNOSIS — I10 Essential (primary) hypertension: Secondary | ICD-10-CM

## 2018-06-24 DIAGNOSIS — R0602 Shortness of breath: Secondary | ICD-10-CM

## 2018-06-24 DIAGNOSIS — Z79891 Long term (current) use of opiate analgesic: Secondary | ICD-10-CM | POA: Diagnosis not present

## 2018-06-24 DIAGNOSIS — I252 Old myocardial infarction: Secondary | ICD-10-CM | POA: Diagnosis not present

## 2018-06-24 LAB — CBC
HCT: 25.6 % — ABNORMAL LOW (ref 36.0–46.0)
HCT: 26.8 % — ABNORMAL LOW (ref 36.0–46.0)
Hemoglobin: 8.3 g/dL — ABNORMAL LOW (ref 12.0–15.0)
Hemoglobin: 8.9 g/dL — ABNORMAL LOW (ref 12.0–15.0)
MCH: 27.7 pg (ref 26.0–34.0)
MCH: 27.9 pg (ref 26.0–34.0)
MCHC: 32.4 g/dL (ref 30.0–36.0)
MCHC: 33.2 g/dL (ref 30.0–36.0)
MCV: 85.3 fL (ref 80.0–100.0)
MCV: 84 fL (ref 80.0–100.0)
Platelets: 445 10*3/uL — ABNORMAL HIGH (ref 150–400)
Platelets: 481 10*3/uL — ABNORMAL HIGH (ref 150–400)
RBC: 3 MIL/uL — ABNORMAL LOW (ref 3.87–5.11)
RBC: 3.19 MIL/uL — ABNORMAL LOW (ref 3.87–5.11)
RDW: 14.8 % (ref 11.5–15.5)
RDW: 14.8 % (ref 11.5–15.5)
WBC: 14.8 10*3/uL — ABNORMAL HIGH (ref 4.0–10.5)
WBC: 13.7 10*3/uL — ABNORMAL HIGH (ref 4.0–10.5)
nRBC: 0 % (ref 0.0–0.2)
nRBC: 0 % (ref 0.0–0.2)

## 2018-06-24 LAB — TYPE AND SCREEN
ABO/RH(D): O POS
Antibody Screen: NEGATIVE
Unit division: 0

## 2018-06-24 LAB — URINALYSIS, ROUTINE W REFLEX MICROSCOPIC
Bilirubin Urine: NEGATIVE
Glucose, UA: NEGATIVE mg/dL
Ketones, ur: NEGATIVE mg/dL
Nitrite: NEGATIVE
Protein, ur: NEGATIVE mg/dL
Specific Gravity, Urine: 1.009 (ref 1.005–1.030)
WBC, UA: >50 WBC/hpf — ABNORMAL HIGH (ref 0–5)
pH: 6 (ref 5.0–8.0)

## 2018-06-24 LAB — ECHOCARDIOGRAM COMPLETE
Height: 63 in
Weight: 2867.2 oz

## 2018-06-24 LAB — COMPREHENSIVE METABOLIC PANEL
ALT: 38 U/L (ref 0–44)
AST: 35 U/L (ref 15–41)
Albumin: 2.1 g/dL — ABNORMAL LOW (ref 3.5–5.0)
Alkaline Phosphatase: 111 U/L (ref 38–126)
Anion gap: 14 (ref 5–15)
BUN: 17 mg/dL (ref 8–23)
CO2: 26 mmol/L (ref 22–32)
Calcium: 10.7 mg/dL — ABNORMAL HIGH (ref 8.9–10.3)
Chloride: 97 mmol/L — ABNORMAL LOW (ref 98–111)
Creatinine, Ser: 1.04 mg/dL — ABNORMAL HIGH (ref 0.44–1.00)
GFR calc Af Amer: 55 mL/min — ABNORMAL LOW (ref >60)
GFR calc non Af Amer: 47 mL/min — ABNORMAL LOW (ref >60)
Glucose, Bld: 121 mg/dL — ABNORMAL HIGH (ref 70–99)
Potassium: 3.6 mmol/L (ref 3.5–5.1)
Sodium: 137 mmol/L (ref 135–145)
Total Bilirubin: 0.6 mg/dL (ref 0.3–1.2)
Total Protein: 5.4 g/dL — ABNORMAL LOW (ref 6.5–8.1)

## 2018-06-24 LAB — BPAM RBC
Blood Product Expiration Date: 202006122359
ISSUE DATE / TIME: 202005291718
Unit Type and Rh: 5100

## 2018-06-24 LAB — BRAIN NATRIURETIC PEPTIDE: B Natriuretic Peptide: 911.5 pg/mL — ABNORMAL HIGH (ref 0.0–100.0)

## 2018-06-24 MED ORDER — SODIUM CHLORIDE 0.9 % IV SOLN
1.0000 g | INTRAVENOUS | Status: DC
  Administered 2018-06-24 – 2018-06-26 (×3): 1 g via INTRAVENOUS
  Filled 2018-06-24 (×3): qty 10

## 2018-06-24 MED ORDER — DICLOFENAC SODIUM 1 % TD GEL
2.0000 g | Freq: Three times a day (TID) | TRANSDERMAL | Status: DC | PRN
  Administered 2018-06-24 – 2018-06-25 (×2): 2 g via TOPICAL
  Filled 2018-06-24: qty 100

## 2018-06-24 MED ORDER — FUROSEMIDE 10 MG/ML IJ SOLN
40.0000 mg | Freq: Once | INTRAMUSCULAR | Status: AC
  Administered 2018-06-24: 40 mg via INTRAVENOUS
  Filled 2018-06-24: qty 4

## 2018-06-24 MED ORDER — PERFLUTREN LIPID MICROSPHERE
INTRAVENOUS | Status: AC
  Filled 2018-06-24: qty 10

## 2018-06-24 MED ORDER — SODIUM CHLORIDE 0.9 % IV SOLN: 510.0000 mg | Freq: Once | INTRAVENOUS | Status: DC

## 2018-06-24 MED ORDER — PERFLUTREN LIPID MICROSPHERE
1.0000 mL | INTRAVENOUS | Status: AC | PRN
  Administered 2018-06-24: 4 mL via INTRAVENOUS
  Filled 2018-06-24: qty 10

## 2018-06-24 MED ORDER — SODIUM CHLORIDE 0.9 % IV SOLN
510.0000 mg | Freq: Once | INTRAVENOUS | Status: AC
  Administered 2018-06-24: 510 mg via INTRAVENOUS
  Filled 2018-06-24: qty 17

## 2018-06-24 NOTE — Progress Notes (Signed)
RN sent page MD, concerning lab results, post blood transfusion.  RN did not receive any orders or call back from [email or web address] .  WBC-17.2 and Hgb-9.6

## 2018-06-24 NOTE — Progress Notes (Signed)
  Echocardiogram 2D Echocardiogram with Definity has been performed.  Gerda Diss 06/24/2018, 1:04 PM

## 2018-06-24 NOTE — Care Management Obs Status (Signed)
MEDICARE OBSERVATION STATUS NOTIFICATION   Patient Details  Name: Bailey Duke MRN: [id number] Date of Birth: 1925-07-15   Medicare Observation Status Notification Given:  Yes    Deveron Furlong, RN 06/24/2018, 4:07 PM

## 2018-06-24 NOTE — Progress Notes (Signed)
Pt was admitted to 3E4 from ED via stretcher. Pt alert and oriented x 4 upon arrival.  Pt arrived on unit with IV pump, going at [facility] with empty blood bag.  1 unit of PRBC's was given in ED and completed prior to arrival to the unit.  Pt requested prescribed medication.  RN explained to pt that medication could not be given before pharmacy reconcile medication.  2LNC placed on pt, due to shortness of breath.  Pt appear very anxious about not getting medication.

## 2018-06-24 NOTE — Progress Notes (Signed)
Pharmacy note: IV iron   85 yo female with SOB noted with HF (EF 25-30%) and with iron deficiency anemia. Pharmacy consulted to dose IV iron -iron= 13, sat= 5%, ferritin = 55 -hg= 8.9  Would estimate an iron dose of about 1250mg  but would like to avoid iron dextran in the setting of diuresis  Plan -Feraheme 510mg  IV today and again on 6/3 -Will follow until doses are completed  Harland German, PharmD Clinical Pharmacist **Pharmacist phone directory can now be found on [email or web address] ([email or web address]).  Listed under [facility].

## 2018-06-24 NOTE — Progress Notes (Signed)
[facility] PROGRESS NOTE  Bailey CarlsGrace Patalano ZOX:[id number] DOB: 02/11/1925 DOA: 06/23/2018 PCP: Bill Salinasurbyfill, Holly I, NP  Assessment/Plan: 1. Acute on chronic CHF with reduced EF exacerbation.  TTE shows further decrease in EF to 25-30%(previously 40-45% on 08/2017), elevated BNP of 900, chest x-ray on admission showed pulmonary edema, presented with weakness/dyspnea on exertion.  Responded well to IV diuresis net -2 L.  Still some mild dyspnea while lying down, will continue IV Lasix and closely monitor output.  Already on Coreg and losartan. continue daily weights, strict I's and O's, fluid restriction, monitor BMP 2. Acute on chronic Symptomaticanemia, iron deficiency anemia.  Discussed case with GI who agrees with supportive care with blood transfusion.  We will also add IV iron.  Risk of endoscopic intervention outweighs the benefit given age and multiple comorbidities.  If patient actively bleeds/this becomes an emergent situation that would change. 3. UTI.  Was previously been treated with ceftriaxone at facility will continue here 4. Chronic leukocytosis, stable.  Remains afebrile.  Doubt any other infection/UTI will closely monitor.  Blood cultures if becomes febrile. 5. Hypercalcemia, chronic.  Calcium stable at 10.5.  The past was related to hyperparathyroidism. 6. CAD, asymptomatic.  TTE no WMA, LV global hypokinesis. Will discuss with cardiology. Continue to monitor on telemetry 7. CKD stage III, creatinine stable at baseline.  Continue home losartan, avoid nephrotoxins, monitor output monitor BMP daily. 8. Depression, stable continue Cymbalta 9. Gout, stable continue allopurinol.  Code Status: DNR Family Communication: called friend Dewayne Hatchnn at listed number on 5/30 will try again tomorrow (indicate person spoken with, relationship, and if by phone, the number) Disposition Plan: IV lasix, , IV iron, monitor hgb s/p transfusion, monitor for any active  bleed   Consultants:  none  Procedures:  TTE, 5/30  Antibiotics:  Ceftriaxone 5/30 (indicate start date, and stop date if known)  HPI/Subjective:  Bailey Duke is a 85 y.o. female with medical history significant for CHF with reduced EF, OSA on CPAP, CKD stage III, HTN, GERD, Chronic iron deficiency anemia, chronic hypercalcemia, leukocytosis, CAD, depression, gout who [facility]23/2018 [facility] with several weeks of progressive fatigue and weakness.  She has noticed decreased ability in getting out of the bed on her own to even use the bathroom because of profound weakness. Also decreased appetite for several months, some intermittent abdominal pain located just above her umbilical area that wraps around to her back and comes and goes with no identifiable alleviating or exacerbating factors Patient has been evaluated for persistently elevated WBC and progressive anemia for the past month ( hgb 4/25 8.4, 5/8 8.4, 5/9 7.6). Stool was noted to be black at the facility but patient is on iron. Fecal occult there was positive for blood. Medical staff spoke with patient and her listed HCPOAs ( ann Microbiologistteighner and Gerlene BurdockJohn Adolphson) who wished for advanced treatment/workup for possible GI blood loss. In the past it seems she has declined further workup.   History of CHF. [facility]20189 [facility]. EF 40-45% with normal appearing diastolic function and mild pulmonary hypertension.  She reports taking her torsemide consistently at her ILF.  Denies any noticeable changes in her breathing, has edema in ankles at baseline that typically improves with elevation and has not noticed any worsening.   Admitted for diagnosis of DOE secondary to CHF exacerbation and symptomatic anemia  "I'm so glad to see you today"  Objective: Vitals:   06/24/18 1609 06/24/18 1928  BP: 140/74 (!) 145/81  Pulse: (!) 104 (!) 108  Resp: 18 18  Temp: 99.4 F (37.4 C) 98.8 F (37.1 C)  SpO2: 97% 98%     Intake/Output Summary (Last 24 hours) at 06/24/2018 2155 Last data filed at 06/24/2018 1759 Gross per 24 hour  Intake 739 ml  Output 3375 ml  Net -2636 ml   Filed Weights   06/23/18 1442 06/23/18 2033 06/24/18 0400  Weight: 82.6 kg 108.1 kg 81.3 kg    Exam:  Constitutional pale appearing, elderly-appearing female, no distress Eyes: EOMI, anicteric, normal conjunctivae ENMT: Oropharynx with moist mucous membranes, normal dentition Cardiovascular: RRR, slight edema bilaterally at ankles/lower legs Respiratory: limited exam by inability to sit up due to weakness, normal respiratory effort Abdomen: Soft, non-tender, no rebound tenderness or guarding Musculoskeletal: pain in right shoulder (chronic) Skin: No rash ulcers, or lesions. Without skin tenting  Neurologic: Grossly no focal neuro deficit. Psychiatric:Appropriate affect, and mood. Mental status alert, oriented to self, place, context   Data Reviewed: Basic Metabolic Panel: Recent Labs  Lab 06/23/18 1547 06/23/18 2306 06/24/18 0634  NA 136  --  137  K 4.3  --  3.6  CL 102  --  97*  CO2 23  --  26  GLUCOSE 127*  --  121*  BUN 21  --  17  CREATININE 1.29*  --  1.04*  CALCIUM 10.5*  --  10.7*  MG  --  2.2  --    Liver Function Tests: Recent Labs  Lab 06/23/18 1547 06/24/18 0634  AST 42* 35  ALT 42 38  ALKPHOS 111 111  BILITOT 0.3 0.6  PROT 5.8* 5.4*  ALBUMIN 2.4* 2.1*   No results for input(s): LIPASE, AMYLASE in the last 168 hours. No results for input(s): AMMONIA in the last 168 hours. CBC: Recent Labs  Lab 06/23/18 1555 06/23/18 2306 06/24/18 0634 06/24/18 1341  WBC 14.7* 17.2* 14.8* 13.7*  NEUTROABS 8.8*  --   --   --   HGB 7.2* 9.6* 8.3* 8.9*  HCT 23.7* 30.3* 25.6* 26.8*  MCV 88.8 86.1 85.3 84.0  PLT 462* 539* 445* 481*   Cardiac Enzymes: No results for input(s): CKTOTAL, CKMB, CKMBINDEX, TROPONINI in the last 168 hours. BNP (last 3 results) Recent Labs    06/23/18 2306  BNP 911.5*     ProBNP (last 3 results) No results for input(s): PROBNP in the last 8760 hours.  CBG: No results for input(s): GLUCAP in the last 168 hours.  Recent Results (from the past 240 hour(s))  SARS Coronavirus 2 (CEPHEID - Performed in [facility] hospital lab), Hosp Order     Status: None   Collection Time: 06/23/18  5:29 PM  Result Value Ref Range Status   SARS Coronavirus 2 NEGATIVE NEGATIVE Final    Comment: (NOTE) If result is NEGATIVE SARS-CoV-2 target nucleic acids are NOT DETECTED. The SARS-CoV-2 RNA is generally detectable in upper and lower  respiratory specimens during the acute phase of infection. The lowest  concentration of SARS-CoV-2 viral copies this assay can detect is 250  copies / mL. A negative result does not preclude SARS-CoV-2 infection  and should not be used as the sole basis for treatment or other  patient management decisions.  A negative result may occur with  improper specimen collection / handling, submission of specimen other  than nasopharyngeal swab, presence of viral mutation(s) within the  areas targeted by this assay, and inadequate number of viral copies  (<250 copies / mL). A negative result  must be combined with clinical  observations, patient history, and epidemiological information. If result is POSITIVE SARS-CoV-2 target nucleic acids are DETECTED. The SARS-CoV-2 RNA is generally detectable in upper and lower  respiratory specimens dur ing the acute phase of infection.  Positive  results are indicative of active infection with SARS-CoV-2.  Clinical  correlation with patient history and other diagnostic information is  necessary to determine patient infection status.  Positive results do  not rule out bacterial infection or co-infection with other viruses. If result is PRESUMPTIVE POSTIVE SARS-CoV-2 nucleic acids MAY BE PRESENT.   A presumptive positive result was obtained on the submitted specimen  and confirmed on repeat testing.  While  2019 novel coronavirus  (SARS-CoV-2) nucleic acids may be present in the submitted sample  additional confirmatory testing may be necessary for epidemiological  and / or clinical management purposes  to differentiate between  SARS-CoV-2 and other Sarbecovirus currently known to infect humans.  If clinically indicated additional testing with an alternate test  methodology [phone]) is advised. The SARS-CoV-2 RNA is generally  detectable in upper and lower respiratory sp ecimens during the acute  phase of infection. The expected result is Negative. Fact Sheet for Patients:  BoilerBrush.com.cy Fact Sheet for Healthcare Providers: https://pope.com/ This test is not yet approved or cleared by the [place] FDA and has been authorized for detection and/or diagnosis of SARS-CoV-2 by FDA under an Emergency Use Authorization (EUA).  This EUA will remain in effect (meaning this test can be used) for the duration of the COVID-19 declaration under Section 564(b)(1) of the Act, 21 U.S.C. section 360bbb-3(b)(1), unless the authorization is terminated or revoked sooner. Performed at [facility], [address] [place], [place] [address]      Studies: Ct Abdomen Pelvis Wo Contrast  Result Date: 06/24/2018 CLINICAL DATA:  Abdominal pain, nausea and vomiting. Diverticulitis suspected clinically. EXAM: CT ABDOMEN AND PELVIS WITHOUT CONTRAST TECHNIQUE: Multidetector CT imaging of the abdomen and pelvis was performed following the standard protocol without IV contrast. COMPARISON:  10/14/2016 FINDINGS: Lower chest: Small effusions layering dependently with dependent pulmonary atelectasis. Hepatobiliary: Liver parenchyma appears normal. No calcified gallstones. Pancreas: Normal Spleen: Normal Adrenals/Urinary Tract: Adrenal glands are normal. No evidence of renal mass or obstruction. There is some vascular calcification. No renal calculi are seen.  Bladder appears normal as seen with regional artifact from hip replacements. Stomach/Bowel: No evidence of ileus or obstruction. Patient has mild diverticulosis, but no evidence of diverticulitis. Vascular/Lymphatic: Aortic atherosclerosis. No aneurysm. IVC is normal. No retroperitoneal adenopathy. Reproductive: No pelvic mass. Other: No free fluid or air. Musculoskeletal: Chronic lower lumbar degenerative changes including anterolisthesis at L4-5 of 1 cm and spinal stenosis. Old superior endplate compression deformity at L2. IMPRESSION: No acute finding.  No evidence of diverticulitis. Small pleural effusions layering dependently. Electronically Signed   By: Paulina Fusi M.D.   On: 06/24/2018 11:57   Dg Chest Port 1 View  Result Date: 06/23/2018 CLINICAL DATA:  Dyspnea EXAM: PORTABLE CHEST 1 VIEW COMPARISON:  12/26/2016 chest radiograph. FINDINGS: Stable cardiomediastinal silhouette with mild cardiomegaly. No pneumothorax. No pleural effusion. Mild pulmonary edema. IMPRESSION: Mild congestive heart failure. Electronically Signed   By: Delbert Phenix M.D.   On: 06/23/2018 21:37    Scheduled Meds: . allopurinol  100 mg Oral Daily  . atorvastatin  40 mg Oral Daily  . carvedilol  3.125 mg Oral BID WC  . DULoxetine  30 mg Oral q morning - 10a  . ferrous  sulfate  325 mg Oral TID WC  . fexofenadine  60 mg Oral Daily  . gabapentin  200 mg Oral QHS  . levothyroxine  125 mcg Oral QAC breakfast  . losartan  25 mg Oral Daily  . Melatonin  4.5 mg Oral QHS  . mirabegron ER  25 mg Oral Daily  . pantoprazole (PROTONIX) IV  40 mg Intravenous Q12H  . perflutren lipid microspheres (DEFINITY) IV suspension      . senna-docusate  1 tablet Oral Daily   Continuous Infusions: . sodium chloride    . cefTRIAXone (ROCEPHIN)  IV Stopped (06/24/18 1045)  . [START ON 06/28/2018] ferumoxytol      Active Problems:   Obstructive sleep apnea   Hypothyroid   Hypertension   GERD (gastroesophageal reflux disease)    Coronary artery disease involving native coronary artery of native heart without angina pectoris   Chronic combined systolic and diastolic heart failure, NYHA class 2 (HCC)   Symptomatic anemia   Acute exacerbation of CHF (congestive heart failure) (HCC)   Acute systolic CHF (congestive heart failure) (HCC)      Laverna Peace  [facility]

## 2018-06-25 ENCOUNTER — Inpatient Hospital Stay (HOSPITAL_COMMUNITY): Payer: Medicare Other

## 2018-06-25 DIAGNOSIS — I5042 Chronic combined systolic (congestive) and diastolic (congestive) heart failure: Secondary | ICD-10-CM

## 2018-06-25 DIAGNOSIS — I5021 Acute systolic (congestive) heart failure: Secondary | ICD-10-CM

## 2018-06-25 DIAGNOSIS — R0609 Other forms of dyspnea: Secondary | ICD-10-CM

## 2018-06-25 LAB — BASIC METABOLIC PANEL
Anion gap: 11 (ref 5–15)
BUN: 17 mg/dL (ref 8–23)
CO2: 25 mmol/L (ref 22–32)
Calcium: 10.5 mg/dL — ABNORMAL HIGH (ref 8.9–10.3)
Chloride: 98 mmol/L (ref 98–111)
Creatinine, Ser: 1.24 mg/dL — ABNORMAL HIGH (ref 0.44–1.00)
GFR calc Af Amer: 44 mL/min — ABNORMAL LOW (ref >60)
GFR calc non Af Amer: 38 mL/min — ABNORMAL LOW (ref >60)
Glucose, Bld: 119 mg/dL — ABNORMAL HIGH (ref 70–99)
Potassium: 3.7 mmol/L (ref 3.5–5.1)
Sodium: 134 mmol/L — ABNORMAL LOW (ref 135–145)

## 2018-06-25 LAB — CBC
HCT: 30.3 % — ABNORMAL LOW (ref 36.0–46.0)
HCT: 26.1 % — ABNORMAL LOW (ref 36.0–46.0)
Hemoglobin: 9.6 g/dL — ABNORMAL LOW (ref 12.0–15.0)
Hemoglobin: 8.4 g/dL — ABNORMAL LOW (ref 12.0–15.0)
MCH: 27.3 pg (ref 26.0–34.0)
MCH: 27.6 pg (ref 26.0–34.0)
MCHC: 31.7 g/dL (ref 30.0–36.0)
MCHC: 32.2 g/dL (ref 30.0–36.0)
MCV: 86.1 fL (ref 80.0–100.0)
MCV: 85.9 fL (ref 80.0–100.0)
Platelets: 539 10*3/uL — ABNORMAL HIGH (ref 150–400)
Platelets: 444 10*3/uL — ABNORMAL HIGH (ref 150–400)
RBC: 3.52 MIL/uL — ABNORMAL LOW (ref 3.87–5.11)
RBC: 3.04 MIL/uL — ABNORMAL LOW (ref 3.87–5.11)
RDW: 14.7 % (ref 11.5–15.5)
RDW: 15 % (ref 11.5–15.5)
WBC: 17.2 10*3/uL — ABNORMAL HIGH (ref 4.0–10.5)
WBC: 13.7 10*3/uL — ABNORMAL HIGH (ref 4.0–10.5)
nRBC: 0 % (ref 0.0–0.2)
nRBC: 0 % (ref 0.0–0.2)

## 2018-06-25 LAB — GLUCOSE, CAPILLARY: Glucose-Capillary: 98 mg/dL (ref 70–99)

## 2018-06-25 MED ORDER — FUROSEMIDE 40 MG PO TABS: 40.0000 mg | ORAL_TABLET | Freq: Every day | ORAL | Status: DC

## 2018-06-25 MED ORDER — TORSEMIDE 20 MG PO TABS
20.0000 mg | ORAL_TABLET | Freq: Every morning | ORAL | Status: DC
  Administered 2018-06-25 – 2018-06-26 (×2): 20 mg via ORAL
  Filled 2018-06-25 (×2): qty 1

## 2018-06-25 NOTE — Progress Notes (Signed)
[facility] PROGRESS NOTE  Bailey Duke WUJ:[id number] DOB: [birth year] DOA: 06/23/2018 PCP: Bill Salinas I, NP  Assessment/Plan: 1. Acute on chronic CHF with reduced EF exacerbation, improving.  TTE shows further decrease in EF to 25-30%(previously 40-45% on 08/2017), elevated BNP of 900, chest x-ray on admission showed pulmonary edema, presented with weakness/dyspnea on exertion.  Responded well to IV diuresis, no pulm edema on repeat CXR, no longer dyspneic, transition to oral torsemide if maintains good volume status plan for dc on 6/1.  Already on Coreg and losartan. continue daily weights, strict I's and O's, fluid restriction, monitor BMP 2. Acute on chronic Symptomatic anemia, iron deficiency anemia.  Discussed case with GI who agrees with supportive care with blood transfusion.  S/p IV iron, hgb has been stable with no overt bleeding. Discussed with HCPOA( nephew Bailey Duke today)  Risk of endoscopic intervention outweighs the benefit given age and multiple comorbidities.  If patient actively bleeds/this becomes an emergent situation that would change. 3. UTI.  Was previously been treated with ceftriaxone at facility will continue here to complete for 7 total days 4. Chronic leukocytosis, stable.  Remains afebrile.  Doubt any other infection/UTI will closely monitor.  Blood cultures if becomes febrile. Add diff (reported concern for leukemia by family) 5. Hypercalcemia, chronic.  Calcium stable at 10.5.   6. CAD, asymptomatic.  TTE no WMA, LV global hypokinesis. Remains asymptomatic.  Continue to monitor on telemetry 7. CKD stage III, creatinine stable at baseline.  Continue home losartan, avoid nephrotoxins, monitor output monitor BMP daily. 8. Depression, stable continue Cymbalta 9. Gout, stable continue allopurinol.  Code Status: DNR Family Communication: called friend Dewayne Hatch at listed number on 5/30 will try again tomorrow (indicate person spoken with, relationship, and if by phone, the  number) Disposition Plan: likely dc in 24 hours if volume status stable on oral torsemide and hgb stable.    Consultants:  none  Procedures:  TTE, 5/30  Antibiotics:  Ceftriaxone 5/30 (indicate start date, and stop date if known)  HPI/Subjective:  Bailey Duke is a 85 y.o. female with medical history significant for CHF with reduced EF, OSA on CPAP, CKD stage III, HTN, GERD, Chronic iron deficiency anemia, chronic hypercalcemia, leukocytosis, CAD, depression, gout who presents on 06/23/2018 from [facility] with several weeks of progressive fatigue and weakness.  She has noticed decreased ability in getting out of the bed on her own to even use the bathroom because of profound weakness. Also decreased appetite for several months, some intermittent abdominal pain located just above her umbilical area that wraps around to her back and comes and goes with no identifiable alleviating or exacerbating factors Patient has been evaluated for persistently elevated WBC and progressive anemia for the past month ( hgb 4/25 8.4, 5/8 8.4, 5/9 7.6). Stool was noted to be black at the facility but patient is on iron. Fecal occult there was positive for blood. Medical staff spoke with patient and her listed HCPOAs ( Bailey Duke and Bailey Duke) who wished for advanced treatment/workup for possible GI blood loss. In the past it seems she has declined further workup.   History of CHF. Last TTE from 8/20189 at [facility]. EF 40-45% with normal appearing diastolic function and mild pulmonary hypertension.  She reports taking her torsemide consistently at her ILF.  Denies any noticeable changes in her breathing, has edema in ankles at baseline that typically improves with elevation and has not noticed any worsening.   Admitted for diagnosis of DOE secondary to  CHF exacerbation and symptomatic anemia "I have pain all over" No chest pain. No dyspnea Objective: Vitals:   06/25/18 1630 06/25/18 1922  BP: (!)  130/59 139/71  Pulse: 98 (!) 102  Resp: 18 18  Temp:  99.1 F (37.3 C)  SpO2:  96%    Intake/Output Summary (Last 24 hours) at 06/25/2018 2025 Last data filed at 06/25/2018 1620 Gross per 24 hour  Intake 1339.52 ml  Output 300 ml  Net 1039.52 ml   Filed Weights   06/23/18 2033 06/24/18 0400 06/25/18 0636  Weight: 108.1 kg 81.3 kg 82.4 kg    Exam:  Constitutional pale appearing, elderly-appearing female, no distress Eyes: EOMI, anicteric, normal conjunctivae ENMT: Oropharynx with moist mucous membranes, normal dentition Cardiovascular: RRR, slight edema bilaterally at ankles/lower legs Respiratory:normal respiratory effort on room air Abdomen: Soft, non-tender, no rebound tenderness or guarding Musculoskeletal: pain in right shoulder (chronic) Skin: No rash ulcers, or lesions. Without skin tenting  Neurologic: Grossly no focal neuro deficit. Psychiatric:Appropriate affect, and mood. Mental status alert, oriented to self, place, context   Data Reviewed: Basic Metabolic Panel: Recent Labs  Lab 06/23/18 1547 06/23/18 2306 06/24/18 0634 06/25/18 0539  NA 136  --  137 134*  K 4.3  --  3.6 3.7  CL 102  --  97* 98  CO2 23  --  26 25  GLUCOSE 127*  --  121* 119*  BUN 21  --  17 17  CREATININE 1.29*  --  1.04* 1.24*  CALCIUM 10.5*  --  10.7* 10.5*  MG  --  2.2  --   --    Liver Function Tests: Recent Labs  Lab 06/23/18 1547 06/24/18 0634  AST 42* 35  ALT 42 38  ALKPHOS 111 111  BILITOT 0.3 0.6  PROT 5.8* 5.4*  ALBUMIN 2.4* 2.1*   No results for input(s): LIPASE, AMYLASE in the last 168 hours. No results for input(s): AMMONIA in the last 168 hours. CBC: Recent Labs  Lab 06/23/18 1555 06/23/18 2306 06/24/18 0634 06/24/18 1341 06/25/18 0539  WBC 14.7* 17.2* 14.8* 13.7* 13.7*  NEUTROABS 8.8*  --   --   --   --   HGB 7.2* 9.6* 8.3* 8.9* 8.4*  HCT 23.7* 30.3* 25.6* 26.8* 26.1*  MCV 88.8 86.1 85.3 84.0 85.9  PLT 462* 539* 445* 481* 444*   Cardiac  Enzymes: No results for input(s): CKTOTAL, CKMB, CKMBINDEX, TROPONINI in the last 168 hours. BNP (last 3 results) Recent Labs    06/23/18 2306  BNP 911.5*    ProBNP (last 3 results) No results for input(s): PROBNP in the last 8760 hours.  CBG: Recent Labs  Lab 06/25/18 0746  GLUCAP 98    Recent Results (from the past 240 hour(s))  SARS Coronavirus 2 (CEPHEID - Performed in [facility] lab), Hosp Order     Status: None   Collection Time: 06/23/18  5:29 PM  Result Value Ref Range Status   SARS Coronavirus 2 NEGATIVE NEGATIVE Final    Comment: (NOTE) If result is NEGATIVE SARS-CoV-2 target nucleic acids are NOT DETECTED. The SARS-CoV-2 RNA is generally detectable in upper and lower  respiratory specimens during the acute phase of infection. The lowest  concentration of SARS-CoV-2 viral copies this assay can detect is 250  copies / mL. A negative result does not preclude SARS-CoV-2 infection  and should not be used as the sole basis for treatment or other  patient management decisions.  A negative result may occur  with  improper specimen collection / handling, submission of specimen other  than nasopharyngeal swab, presence of viral mutation(s) within the  areas targeted by this assay, and inadequate number of viral copies  (<250 copies / mL). A negative result must be combined with clinical  observations, patient history, and epidemiological information. If result is POSITIVE SARS-CoV-2 target nucleic acids are DETECTED. The SARS-CoV-2 RNA is generally detectable in upper and lower  respiratory specimens dur ing the acute phase of infection.  Positive  results are indicative of active infection with SARS-CoV-2.  Clinical  correlation with patient history and other diagnostic information is  necessary to determine patient infection status.  Positive results do  not rule out bacterial infection or co-infection with other viruses. If result is PRESUMPTIVE  POSTIVE SARS-CoV-2 nucleic acids MAY BE PRESENT.   A presumptive positive result was obtained on the submitted specimen  and confirmed on repeat testing.  While 2019 novel coronavirus  (SARS-CoV-2) nucleic acids may be present in the submitted sample  additional confirmatory testing may be necessary for epidemiological  and / or clinical management purposes  to differentiate between  SARS-CoV-2 and other Sarbecovirus currently known to infect humans.  If clinically indicated additional testing with an alternate test  methodology [id number]) is advised. The SARS-CoV-2 RNA is generally  detectable in upper and lower respiratory sp ecimens during the acute  phase of infection. The expected result is Negative. Fact Sheet for Patients:  BoilerBrush.com.cy Fact Sheet for Healthcare Providers: https://pope.com/ This test is not yet approved or cleared by the [place] FDA and has been authorized for detection and/or diagnosis of SARS-CoV-2 by FDA under an Emergency Use Authorization (EUA).  This EUA will remain in effect (meaning this test can be used) for the duration of the COVID-19 declaration under Section 564(b)(1) of the Act, 21 U.S.C. section 360bbb-3(b)(1), unless the authorization is terminated or revoked sooner. Performed at [facility], [address], [place], [place] [address]      Studies: Ct Abdomen Pelvis Wo Contrast  Result Date: 06/24/2018 CLINICAL DATA:  Abdominal pain, nausea and vomiting. Diverticulitis suspected clinically. EXAM: CT ABDOMEN AND PELVIS WITHOUT CONTRAST TECHNIQUE: Multidetector CT imaging of the abdomen and pelvis was performed following the standard protocol without IV contrast. COMPARISON:  10/14/2016 FINDINGS: Lower chest: Small effusions layering dependently with dependent pulmonary atelectasis. Hepatobiliary: Liver parenchyma appears normal. No calcified gallstones. Pancreas: Normal Spleen:  Normal Adrenals/Urinary Tract: Adrenal glands are normal. No evidence of renal mass or obstruction. There is some vascular calcification. No renal calculi are seen. Bladder appears normal as seen with regional artifact from hip replacements. Stomach/Bowel: No evidence of ileus or obstruction. Patient has mild diverticulosis, but no evidence of diverticulitis. Vascular/Lymphatic: Aortic atherosclerosis. No aneurysm. IVC is normal. No retroperitoneal adenopathy. Reproductive: No pelvic mass. Other: No free fluid or air. Musculoskeletal: Chronic lower lumbar degenerative changes including anterolisthesis at L4-5 of 1 cm and spinal stenosis. Old superior endplate compression deformity at L2. IMPRESSION: No acute finding.  No evidence of diverticulitis. Small pleural effusions layering dependently. Electronically Signed   By: Paulina Fusi M.D.   On: 06/24/2018 11:57   Dg Chest Port 1 View  Result Date: 06/25/2018 CLINICAL DATA:  CHF EXAM: PORTABLE CHEST 1 VIEW COMPARISON:  06/23/2018 chest radiograph. FINDINGS: Stable cardiomediastinal silhouette with mild cardiomegaly. No pneumothorax. No pleural effusion. No pulmonary edema. Mild left basilar atelectasis. IMPRESSION: Cardiomegaly without pulmonary edema. Mild left basilar atelectasis. Electronically Signed   By: Delbert Phenix  M.D.   On: 06/25/2018 13:45   Dg Chest Port 1 View  Result Date: 06/23/2018 CLINICAL DATA:  Dyspnea EXAM: PORTABLE CHEST 1 VIEW COMPARISON:  12/26/2016 chest radiograph. FINDINGS: Stable cardiomediastinal silhouette with mild cardiomegaly. No pneumothorax. No pleural effusion. Mild pulmonary edema. IMPRESSION: Mild congestive heart failure. Electronically Signed   By: Delbert Phenix M.D.   On: 06/23/2018 21:37    Scheduled Meds: . allopurinol  100 mg Oral Daily  . atorvastatin  40 mg Oral Daily  . carvedilol  3.125 mg Oral BID WC  . DULoxetine  30 mg Oral q morning - 10a  . ferrous sulfate  325 mg Oral TID WC  . fexofenadine  60 mg  Oral Daily  . gabapentin  200 mg Oral QHS  . levothyroxine  125 mcg Oral QAC breakfast  . losartan  25 mg Oral Daily  . Melatonin  4.5 mg Oral QHS  . mirabegron ER  25 mg Oral Daily  . pantoprazole (PROTONIX) IV  40 mg Intravenous Q12H  . senna-docusate  1 tablet Oral Daily  . torsemide  20 mg Oral q morning - 10a   Continuous Infusions: . cefTRIAXone (ROCEPHIN)  IV Stopped (06/25/18 1127)  . [START ON 06/28/2018] ferumoxytol      Active Problems:   Obstructive sleep apnea   Hypothyroid   Hypertension   GERD (gastroesophageal reflux disease)   Coronary artery disease involving native coronary artery of native heart without angina pectoris   Chronic combined systolic and diastolic heart failure, NYHA class 2 (HCC)   Symptomatic anemia   Acute exacerbation of CHF (congestive heart failure) (HCC)   Acute systolic CHF (congestive heart failure) (HCC)      Laverna Peace  [facility]

## 2018-06-25 NOTE — [place] FL2 (Signed)
[place] MEDICAID FL2 LEVEL OF CARE SCREENING TOOL     IDENTIFICATION  Patient Name: Bailey Duke Birthdate: 1925/07/06 Sex: female Admission Date (Current Location): 06/23/2018  [facility] and [place] Number:  Producer, television/film/video and Address:  [facility], [address] [place], [place] [address]      Provider Number: [id number]  Attending Physician Name and Address:  Laverna Peace, MD  Relative Name and Phone Number:  Patricia Pesa [phone]    Current Level of Care: Hospital Recommended Level of Care: Skilled Nursing Facility Prior Approval Number:    Date Approved/Denied:   PASRR Number: [id number]  Discharge Plan: SNF    Current Diagnoses: Patient Active Problem List   Diagnosis Date Noted  . Acute exacerbation of CHF (congestive heart failure) (HCC) 06/24/2018  . Acute systolic CHF (congestive heart failure) (HCC) 06/24/2018  . Symptomatic anemia 06/23/2018  . Coronary artery disease involving native coronary artery of native heart without angina pectoris 03/08/2018  . Chronic combined systolic and diastolic heart failure, NYHA class 2 (HCC) 03/08/2018  . Ischemic cardiomyopathy 03/08/2018  . Metabolic encephalopathy 12/01/2016  . Acute lower UTI 12/01/2016  . Acute encephalopathy 12/01/2016  . Lower urinary tract infection   . Hyperparathyroidism, unspecified (HCC) 05/16/2015  . Coronary artery abnormality 11/19/2014  . Dementia (HCC) 09/15/2014  . Hyperlipidemia 03/18/2014  . Chronic kidney disease, stage 3 (moderate) (HCC) 07/12/2013  . Hypercalcemia 07/12/2013  . Spinal stenosis of lumbar region 06/04/2013  . Tendinitis of right rotator cuff 06/04/2013  . TIA (transient ischemic attack) 01/24/2013  . Hypertension   . Depressive disorder   . Anxiety   . GERD (gastroesophageal reflux disease)   . Allergic rhinitis due to pollen 01/04/2011  . Allergic conjunctivitis 01/04/2011  . Obstructive sleep apnea 01/04/2011  .  Hypothyroid 01/04/2011    Orientation RESPIRATION BLADDER Height & Weight     Self, Time, Situation, Place  Normal Continent Weight: 181 lb 10.5 oz (82.4 kg) Height:  5\' 3"  (160 cm)  BEHAVIORAL SYMPTOMS/MOOD NEUROLOGICAL BOWEL NUTRITION STATUS      Continent Diet(see discharge summary)  AMBULATORY STATUS COMMUNICATION OF NEEDS Skin   Limited Assist Verbally Normal                       Personal Care Assistance Level of Assistance  Bathing, Feeding, Dressing, Total care Bathing Assistance: Limited assistance Feeding assistance: Independent Dressing Assistance: Limited assistance Total Care Assistance: Limited assistance   Functional Limitations Info  Sight, Hearing, Speech Sight Info: Adequate Hearing Info: Adequate Speech Info: Adequate    SPECIAL CARE FACTORS FREQUENCY  PT (By licensed PT), OT (By licensed OT)     PT Frequency: min 5x weekly OT Frequency: min 5x weekly            Contractures Contractures Info: Not present    Additional Factors Info  Code Status, Allergies Code Status Info: DNR Allergies Info: TETANUS TOXOIDS, PENICILLINS, PROCAINE, SULFA ANTIBIOTICS            Current Medications (06/25/2018):  This is the current hospital active medication list Current Facility-Administered Medications  Medication Dose Route Frequency Provider Last Rate Last Dose  . acetaminophen (TYLENOL) tablet 650 mg  650 mg Oral Q4H PRN Roberto Scales D, MD   650 mg at 06/25/18 1039  . allopurinol (ZYLOPRIM) tablet 100 mg  100 mg Oral Daily Roberto Scales D, MD   100 mg at 06/25/18 1039  . atorvastatin (LIPITOR)  tablet 40 mg  40 mg Oral Daily Roberto ScalesNettey, Shayla D, MD   40 mg at 06/25/18 1040  . carvedilol (COREG) tablet 3.125 mg  3.125 mg Oral BID WC Roberto ScalesNettey, Shayla D, MD   3.125 mg at 06/25/18 [id number]  . cefTRIAXone (ROCEPHIN) 1 g in sodium chloride 0.9 % 100 mL IVPB  1 g Intravenous Q24H Laverna PeaceNettey, Shayla D, MD   Stopped at 06/25/18 1127  . diclofenac sodium (VOLTAREN) 1 %  transdermal gel 2 g  2 g Topical TID PRN Roberto ScalesNettey, Shayla D, MD   2 g at 06/25/18 1042  . DULoxetine (CYMBALTA) DR capsule 30 mg  30 mg Oral q morning - 10a Roberto ScalesNettey, Shayla D, MD   30 mg at 06/25/18 1040  . ferrous sulfate tablet 325 mg  325 mg Oral TID WC Roberto ScalesNettey, Shayla D, MD   325 mg at 06/25/18 1040  . [START ON 06/28/2018] ferumoxytol (FERAHEME) 510 mg in sodium chloride 0.9 % 100 mL IVPB  510 mg Intravenous Once Silvana NewnessMeyer, Andrew D, [facility]      . fexofenadine (ALLEGRA) tablet 60 mg  60 mg Oral Daily Roberto ScalesNettey, Shayla D, MD   60 mg at 06/25/18 1037  . gabapentin (NEURONTIN) capsule 200 mg  200 mg Oral QHS Roberto ScalesNettey, Shayla D, MD   200 mg at 06/24/18 2107  . ipratropium (ATROVENT) 0.03 % nasal spray 2 spray  2 spray Each Nare BID PRN Roberto ScalesNettey, Shayla D, MD      . levothyroxine (SYNTHROID) tablet 125 mcg  125 mcg Oral QAC breakfast Roberto ScalesNettey, Shayla D, MD   125 mcg at 06/25/18 [phone]  . losartan (COZAAR) tablet 25 mg  25 mg Oral Daily Roberto ScalesNettey, Shayla D, MD   25 mg at 06/25/18 1039  . Melatonin TABS 4.5 mg  4.5 mg Oral QHS Roberto ScalesNettey, Shayla D, MD   4.5 mg at 06/24/18 2111  . mirabegron ER (MYRBETRIQ) tablet 25 mg  25 mg Oral Daily Roberto ScalesNettey, Shayla D, MD   25 mg at 06/25/18 1040  . nystatin cream (MYCOSTATIN) 1 application  1 application Topical TID PRN Roberto ScalesNettey, Shayla D, MD      . ondansetron (ZOFRAN) tablet 4 mg  4 mg Oral Q6H PRN Roberto ScalesNettey, Shayla D, MD       Or  . ondansetron (ZOFRAN) injection 4 mg  4 mg Intravenous Q6H PRN Roberto ScalesNettey, Shayla D, MD   4 mg at 06/24/18 2108  . pantoprazole (PROTONIX) injection 40 mg  40 mg Intravenous Q12H Roberto ScalesNettey, Shayla D, MD   40 mg at 06/25/18 1040  . polyethylene glycol (MIRALAX / GLYCOLAX) packet 17 g  17 g Oral Daily PRN Roberto ScalesNettey, Shayla D, MD      . senna-docusate (Senokot-S) tablet 1 tablet  1 tablet Oral Daily Roberto ScalesNettey, Shayla D, MD   1 tablet at 06/25/18 1040  . torsemide (DEMADEX) tablet 20 mg  20 mg Oral q morning - 10a Nettey, Drema PryShayla D, MD      . traMADol Janean Sark(ULTRAM) tablet 50 mg  50 mg Oral  Q6H PRN Roberto ScalesNettey, Shayla D, MD   50 mg at 06/25/18 1335     Discharge Medications: Please see discharge summary for a list of discharge medications.  Relevant Imaging Results:  Relevant Lab Results:   Additional Information SSN: [id number]  Gildardo GriffesAshley M Arriona Prest, LCSW

## 2018-06-25 NOTE — Progress Notes (Signed)
RT placed patient on CPAP with 2L O2 bled into circuit. Patient tolerating well at this time. RT will monitor as needed. 

## 2018-06-25 NOTE — Evaluation (Signed)
Physical Therapy Evaluation Patient Details Name: Bailey Duke MRN: [id number] DOB: 12-Aug-1925 Today's Date: 06/25/2018   History of Present Illness   85 y.o. female with medical history significant for CHF with reduced EF, OSA on CPAP, CKD stage III, HTN, GERD, Chronic iron deficiency anemia, chronic hypercalcemia, leukocytosis, CAD, depression, gout who presents on 06/23/2018 from [facility] with several weeks of progressive fatigue and weakness. In ED found to have hemoglobin of 7.2 and fecal occult test positive. Admitted 5/29 for anemia.  Clinical Impression  PTA pt resident of [facility] and was independent in mobility with Rollator and only required assist with bathing. Pt reports that since Covid the residents have been limited in their ability to get out of their rooms, simultaneously pt reports getting weaker. Pt currently is mod A for bed mobility, transfers and ambulation of 2-3 feet between bed, BSC and recliner. PT recommending SNF level rehab for pt to regain her independence prior to returning to ALF. PT will continue to follow acutely.     Follow Up Recommendations SNF    Equipment Recommendations  None recommended by PT    Recommendations for Other Services       Precautions / Restrictions Precautions Precautions: Fall Restrictions Weight Bearing Restrictions: No      Mobility  Bed Mobility Overal bed mobility: Needs Assistance Bed Mobility: Supine to Sit     Supine to sit: Mod assist     General bed mobility comments: modA for bring trunk to upright with HoB elevated, painful R shoulder  Transfers Overall transfer level: Needs assistance Equipment used: Rolling walker (2 wheeled) Transfers: Sit to/from Stand Sit to Stand: Min assist;Mod assist;From elevated surface         General transfer comment: minA for power up from elevated bed surface, modA for power up from lower [facility]  Ambulation/Gait Ambulation/Gait assistance: Mod assist Gait  Distance (Feet): 2 Feet Assistive device: Rolling walker (2 wheeled) Gait Pattern/deviations: Step-to pattern;Steppage;Decreased dorsiflexion - right;Decreased dorsiflexion - left;Trunk flexed Gait velocity: slowed Gait velocity interpretation: <1.31 ft/sec, indicative of household ambulator General Gait Details: slow, shuffling steps from bed to [facility] and BSC to recliner, pt unable to come to fully upright with RW leaning forearms on RW as pt too painful to grasp handles of RW        Balance Overall balance assessment: Needs assistance Sitting-balance support: Feet supported;No upper extremity supported Sitting balance-Leahy Scale: Fair     Standing balance support: Bilateral upper extremity supported Standing balance-Leahy Scale: Zero Standing balance comment: requires outside support in addition to UE support to maintain balance                             Pertinent Vitals/Pain Pain Assessment: Faces Faces Pain Scale: Hurts whole lot Pain Descriptors / Indicators: Grimacing;Guarding;Sharp;Shooting Pain Intervention(s): Limited activity within patient's tolerance;Monitored during session;Repositioned    Home Living Family/patient expects to be discharged to:: Assisted living               Home Equipment: Walker - 4 wheels;Electric scooter      Prior Function Level of Independence: Needs assistance   Gait / Transfers Assistance Needed: use Rollator in room and scooter to go to dining room  ADL's / Homemaking Assistance Needed: assist for bathing back and LE           Extremity/Trunk Assessment   Upper Extremity Assessment Upper Extremity Assessment: LUE deficits/detail LUE Deficits /  Details: painful to touch, unable to flex or abduct shoulder due to pain, elbow flex/ext with zipping pain to fingers LUE: Unable to fully assess due to pain    Lower Extremity Assessment Lower Extremity Assessment: RLE deficits/detail;LLE deficits/detail RLE Deficits  / Details: ROM WFL, strength grossly 4-/5, plantar flexion painful with crying out, history of gout  RLE Coordination: decreased gross motor LLE Deficits / Details: ROM WFL, strength grossly 4-/5, plantar flexion painful with crying out history of gout LLE: Unable to fully assess due to pain LLE Coordination: decreased gross motor       Communication   Communication: HOH  Cognition Arousal/Alertness: Awake/alert Behavior During Therapy: WFL for tasks assessed/performed Overall Cognitive Status: Impaired/Different from baseline Area of Impairment: Attention;Memory;Following commands;Problem solving;Awareness                   Current Attention Level: Selective Memory: Decreased short-term memory Following Commands: Follows one step commands consistently;Follows multi-step commands with increased time   Awareness: Emergent Problem Solving: Slow processing;Decreased initiation;Difficulty sequencing;Requires verbal cues;Requires tactile cues        General Comments General comments (skin integrity, edema, etc.): SaO2 on RA >90% throughout session        Assessment/Plan    PT Assessment Patient needs continued PT services  PT Problem List Decreased strength;Decreased range of motion;Decreased activity tolerance;Decreased balance;Decreased mobility;Decreased coordination;Pain       PT Treatment Interventions DME instruction;Gait training;Functional mobility training;Therapeutic activities;Therapeutic exercise;Balance training;Cognitive remediation;Patient/family education    PT Goals (Current goals can be found in the Care Plan section)  Acute Rehab PT Goals Patient Stated Goal: have less pain PT Goal Formulation: With patient Time For Goal Achievement: 07/09/18 Potential to Achieve Goals: Fair    Frequency Min 2X/week    AM-PAC PT "6 Clicks" Mobility  Outcome Measure Help needed turning from your back to your side while in a flat bed without using bedrails?: A  Little Help needed moving from lying on your back to sitting on the side of a flat bed without using bedrails?: A Lot Help needed moving to and from a bed to a chair (including a wheelchair)?: A Lot Help needed standing up from a chair using your arms (e.g., wheelchair or bedside chair)?: A Lot Help needed to walk in hospital room?: Total Help needed climbing 3-5 steps with a railing? : Total 6 Click Score: 11    End of Session Equipment Utilized During Treatment: Gait belt Activity Tolerance: Patient limited by pain Patient left: in chair;with call bell/phone within reach Nurse Communication: Mobility status PT Visit Diagnosis: Unsteadiness on feet (R26.81);Other abnormalities of gait and mobility (R26.89);Muscle weakness (generalized) (M62.81);Difficulty in walking, not elsewhere classified (R26.2);Pain Pain - Right/Left: (bilateral) Pain - part of body: Ankle and joints of foot    Time: 1131-1220 PT Time Calculation (min) (ACUTE ONLY): 49 min   Charges:   PT Evaluation $PT Eval Moderate Complexity: 1 Mod PT Treatments $Gait Training: 8-22 mins $Therapeutic Activity: 8-22 mins        Seleny Allbright B. Beverely RisenVan Fleet PT, DPT Acute Rehabilitation Services Pager [phone] Office [phone]   Elon Alaslizabeth B Van Fleet 06/25/2018, 1:40 PM

## 2018-06-25 NOTE — Plan of Care (Signed)
  Problem: Education: Goal: Knowledge of General Education information will improve Description Including pain rating scale, medication(s)/side effects and non-pharmacologic comfort measures Outcome: Adequate for Discharge   Problem: Health Behavior/Discharge Planning: Goal: Ability to manage health-related needs will improve Outcome: Adequate for Discharge   

## 2018-06-25 NOTE — Plan of Care (Signed)
Patient stable, discussed POC with patient and family, agreeable with plan, denies additional question/concerns at this time.

## 2018-06-26 LAB — BASIC METABOLIC PANEL
Anion gap: 12 (ref 5–15)
BUN: 20 mg/dL (ref 8–23)
CO2: 25 mmol/L (ref 22–32)
Calcium: 10.4 mg/dL — ABNORMAL HIGH (ref 8.9–10.3)
Chloride: 94 mmol/L — ABNORMAL LOW (ref 98–111)
Creatinine, Ser: 1.23 mg/dL — ABNORMAL HIGH (ref 0.44–1.00)
GFR calc Af Amer: 45 mL/min — ABNORMAL LOW (ref >60)
GFR calc non Af Amer: 39 mL/min — ABNORMAL LOW (ref >60)
Glucose, Bld: 166 mg/dL — ABNORMAL HIGH (ref 70–99)
Potassium: 3.4 mmol/L — ABNORMAL LOW (ref 3.5–5.1)
Sodium: 131 mmol/L — ABNORMAL LOW (ref 135–145)

## 2018-06-26 LAB — CBC WITH DIFFERENTIAL/PLATELET
Abs Immature Granulocytes: 0.11 10*3/uL — ABNORMAL HIGH (ref 0.00–0.07)
Basophils Absolute: 0 10*3/uL (ref 0.0–0.1)
Basophils Relative: 0 %
Eosinophils Absolute: 0.2 10*3/uL (ref 0.0–0.5)
Eosinophils Relative: 2 %
HCT: 28.6 % — ABNORMAL LOW (ref 36.0–46.0)
Hemoglobin: 9 g/dL — ABNORMAL LOW (ref 12.0–15.0)
Immature Granulocytes: 1 %
Lymphocytes Relative: 24 %
Lymphs Abs: 3.6 10*3/uL (ref 0.7–4.0)
MCH: 27.1 pg (ref 26.0–34.0)
MCHC: 31.5 g/dL (ref 30.0–36.0)
MCV: 86.1 fL (ref 80.0–100.0)
Monocytes Absolute: 1.3 10*3/uL — ABNORMAL HIGH (ref 0.1–1.0)
Monocytes Relative: 9 %
Neutro Abs: 9.8 10*3/uL — ABNORMAL HIGH (ref 1.7–7.7)
Neutrophils Relative %: 64 %
Platelets: 459 10*3/uL — ABNORMAL HIGH (ref 150–400)
RBC: 3.32 MIL/uL — ABNORMAL LOW (ref 3.87–5.11)
RDW: 15 % (ref 11.5–15.5)
WBC: 15.1 10*3/uL — ABNORMAL HIGH (ref 4.0–10.5)
nRBC: 0 % (ref 0.0–0.2)

## 2018-06-26 LAB — GLUCOSE, CAPILLARY: Glucose-Capillary: 120 mg/dL — ABNORMAL HIGH (ref 70–99)

## 2018-06-26 MED ORDER — DICLOFENAC SODIUM 1 % TD GEL: 2.0000 g | Freq: Three times a day (TID) | TRANSDERMAL | Status: AC | PRN

## 2018-06-26 MED ORDER — POTASSIUM CHLORIDE 20 MEQ PO PACK
40.0000 meq | PACK | Freq: Once | ORAL | Status: AC
  Administered 2018-06-26: 40 meq via ORAL
  Filled 2018-06-26: qty 2

## 2018-06-26 NOTE — Plan of Care (Signed)
  Problem: Education: Goal: Knowledge of General Education information will improve Description Including pain rating scale, medication(s)/side effects and non-pharmacologic comfort measures Outcome: Adequate for Discharge   Problem: Coping: Goal: Level of anxiety will decrease Outcome: Adequate for Discharge   Problem: Nutrition: Goal: Adequate nutrition will be maintained Outcome: Adequate for Discharge   Problem: Activity: Goal: Capacity to carry out activities will improve Outcome: Adequate for Discharge

## 2018-06-26 NOTE — TOC Initial Note (Addendum)
Transition of Care (TOC) - Initial/Assessment Note    Patient Details  Name: Bailey Duke MRN: [id number] Date of Birth: 12/29/1925  Transition of Care (TOC) CM/SW Contact:     C , LCSW Phone Number: 06/26/2018, 9:32 AM  Clinical Narrative: CSW met with patient, introduced role, and explained that PT recommendations would be discussed. Patient confirmed she is from the ALF side at [facility] but is agreeable to transitioning to the SNF side for short-term rehab. Faxed referral to facility for review. She will not need another COVID-19 test due to 14-day isolation they will put her in at the facility. No further concerns. CSW encouraged patient to contact CSW as needed. CSW will continue to follow patient for support and facilitate discharge to SNF side, if accepted, today.   11:11 am: [facility] is able to accept patient on the SNF side today.  12:24 pm: Received call from SNF admissions coordinator. She called patient's insurance company who stated patient has an HMO plan rather than a PPO plan which is on her card. Patient would have to pay privately for her SNF stay at this facility. It costs $443 per day and this does not include therapy costs. Admissions coordinator said in the past [employer] has paid for part of the stay even though they were not really in network so patient would have to take a chance to risk this. They would allow her to return to ALF if she is safe to do so. Otherwise may have to look at outside SNF.    1:32 pm: Discussed above information with patient. She is agreeable to private pay and considers it her only option right now. Home health agencies are not allowed in due to COVID restrictions. Discussed going to an outside SNF where insurance would cover it and she adamantly refused. She gave CSW permission to discuss with her HCPOA Ann. She stated patient was using a motorized wheelchair at the ALF a few months ago. SNF admissions coordinator said based on PT  notes she would still need SNF since she needs mod assist +2 to stand up to her walker.         Expected Discharge Plan: Skilled Nursing Facility Barriers to Discharge: SNF Pending bed offer   Patient Goals and CMS Choice        Expected Discharge Plan and Services Expected Discharge Plan: Skilled Nursing Facility       Living arrangements for the past 2 months: Assisted Living Facility Expected Discharge Date: 06/26/18                                    Prior Living Arrangements/Services Living arrangements for the past 2 months: Assisted Living Facility Lives with:: Facility Resident Patient language and need for interpreter reviewed:: Yes(No needs.) Do you feel safe going back to the place where you live?: Yes      Need for Family Participation in Patient Care: Yes (Comment) Care giver support system in place?: Yes (comment)(SNF/ALF staff)   Criminal Activity/Legal Involvement Pertinent to Current Situation/Hospitalization: No - Comment as needed  Activities of Daily Living Home Assistive Devices/Equipment: Walker (specify type), Shower chair with back ADL Screening (condition at time of admission) Patient's cognitive ability adequate to safely complete daily activities?: Yes Is the patient deaf or have difficulty hearing?: No Does the patient have difficulty seeing, even when wearing glasses/contacts?: No Does the patient have difficulty concentrating, remembering, or   making decisions?: Yes Patient able to express need for assistance with ADLs?: Yes Does the patient have difficulty dressing or bathing?: No Independently performs ADLs?: No Communication: Independent Dressing (OT): Independent Grooming: Needs assistance Feeding: Independent Bathing: Needs assistance, Independent with device (comment) Is this a change from baseline?: Pre-admission baseline Toileting: Independent with device (comment) In/Out Bed: Independent with device (comment) Walks in  Home: Independent with device (comment) Does the patient have difficulty walking or climbing stairs?: Yes Weakness of Legs: Both Weakness of Arms/Hands: Both  Permission Sought/Granted Permission sought to share information with : Facility Contact Representative Permission granted to share information with : Yes, Verbal Permission Granted     Permission granted to share info w AGENCY: [facility]        Emotional Assessment Appearance:: Appears stated age Attitude/Demeanor/Rapport: Engaged, Gracious Affect (typically observed): Accepting, Appropriate, Calm, Pleasant Orientation: : Oriented to Self, Oriented to Place, Oriented to  Time, Oriented to Situation Alcohol / Substance Use: Never Used Psych Involvement: No (comment)  Admission diagnosis:  Heme positive stool [R19.5] Generalized weakness [R53.1] Symptomatic anemia [D64.9] Patient Active Problem List   Diagnosis Date Noted  . Acute exacerbation of CHF (congestive heart failure) (HCC) 06/24/2018  . Acute systolic CHF (congestive heart failure) (HCC) 06/24/2018  . Symptomatic anemia 06/23/2018  . Coronary artery disease involving native coronary artery of native heart without angina pectoris 03/08/2018  . Chronic combined systolic and diastolic heart failure, NYHA class 2 (HCC) 03/08/2018  . Ischemic cardiomyopathy 03/08/2018  . Metabolic encephalopathy 12/01/2016  . Acute lower UTI 12/01/2016  . Acute encephalopathy 12/01/2016  . Lower urinary tract infection   . Hyperparathyroidism, unspecified (HCC) 05/16/2015  . Coronary artery abnormality 11/19/2014  . Dementia (HCC) 09/15/2014  . Hyperlipidemia 03/18/2014  . Chronic kidney disease, stage 3 (moderate) (HCC) 07/12/2013  . Hypercalcemia 07/12/2013  . Spinal stenosis of lumbar region 06/04/2013  . Tendinitis of right rotator cuff 06/04/2013  . TIA (transient ischemic attack) 01/24/2013  . Hypertension   . Depressive disorder   . Anxiety   . GERD  (gastroesophageal reflux disease)   . Allergic rhinitis due to pollen 01/04/2011  . Allergic conjunctivitis 01/04/2011  . Obstructive sleep apnea 01/04/2011  . Hypothyroid 01/04/2011   PCP:  Turbyfill, Holly I, NP Pharmacy:   [facility] [place], [place] - [address] [place] [address] Phone: [phone] Fax: [phone]     Social Determinants of Health (SDOH) Interventions    Readmission Risk Interventions No flowsheet data found.  

## 2018-06-26 NOTE — TOC Transition Note (Signed)
Transition of Care [facility]) - CM/SW Discharge Note   Patient Details  Name: Triva Kristof MRN: [id number] Date of Birth: 1926-01-23  Transition of [facility]) CM/SW Contact:  Margarito Liner, LCSW Phone Number: 06/26/2018, 1:44 PM   Clinical Narrative:  CSW facilitated patient discharge including contacting patient family and facility to confirm patient discharge plans. Clinical information faxed to facility and family agreeable with plan. SNF will pick her up from the hospital in a wheelchair van at 3:30 pm. RN to call report prior to discharge [phone]).  CSW will sign off for now as social work intervention is no longer needed. Please consult [place] again if new needs arise.  Final next level of care: Skilled Nursing Facility Barriers to Discharge: Barriers Resolved   Patient Goals and CMS Choice Patient states their goals for this hospitalization and ongoing recovery are:: "I want to get home as soon as possible."      Discharge Placement   Existing PASRR number confirmed : 06/25/18          Patient chooses bed at: [facility] Patient to be transferred to facility by: SNF will transport Name of family member notified: Christ Kick Patient and family notified of of transfer: 06/26/18  Discharge Plan and Services                                     Social Determinants of Health (SDOH) Interventions     Readmission Risk Interventions No flowsheet data found.

## 2018-06-26 NOTE — Progress Notes (Signed)
Physical Therapy Treatment Patient Details Name: Bailey Duke MRN: [id number] DOB: 02/19/1925 Today's Date: 06/26/2018    History of Present Illness  85 y.o. female with medical history significant for CHF with reduced EF, OSA on CPAP, CKD stage III, HTN, GERD, Chronic iron deficiency anemia, chronic hypercalcemia, leukocytosis, CAD, depression, gout who [facility] with several weeks of progressive fatigue and weakness. In ED found to have hemoglobin of 7.2 and fecal occult test positive. Admitted 5/29 for anemia.    PT Comments    Pt eager to work with therapy this morning. Pt making good progress towards her goals however continues to be limited in safe mobility by decreased strength and endurance. Pt requires modAx2 for power up to RW, and min A with close chair follow for ambulation of 12 feet with RW. Pt visibly fatigued by end of session and requests to take a nap. D/c plans remain appropriate at this time. PT will continue to follow acutely.    Follow Up Recommendations  SNF     Equipment Recommendations  None recommended by PT       Precautions / Restrictions Precautions Precautions: Fall Restrictions Weight Bearing Restrictions: No    Mobility  Bed Mobility               General bed mobility comments: OOB in recliner on entry   Transfers Overall transfer level: Needs assistance Equipment used: Rolling walker (2 wheeled) Transfers: Sit to/from Stand Sit to Stand: Mod assist;+2 physical assistance         General transfer comment: modAx2 for power up from lower recliner surface, increased instruction needed for hand and foot placement for efficient power up  Ambulation/Gait Ambulation/Gait assistance: Min assist Gait Distance (Feet): 12 Feet Assistive device: Rolling walker (2 wheeled) Gait Pattern/deviations: Step-to pattern;Steppage;Decreased dorsiflexion - right;Decreased dorsiflexion - left;Trunk flexed Gait velocity:  slowed Gait velocity interpretation: <1.31 ft/sec, indicative of household ambulator General Gait Details: min A for steadying, with very slow, shuffling gait vc for upright posture and proximity to RW         Balance Overall balance assessment: Needs assistance Sitting-balance support: Feet supported;No upper extremity supported Sitting balance-Leahy Scale: Fair     Standing balance support: Bilateral upper extremity supported Standing balance-Leahy Scale: Zero Standing balance comment: requires outside support in addition to UE support to maintain balance                            Cognition Arousal/Alertness: Awake/alert Behavior During Therapy: WFL for tasks assessed/performed Overall Cognitive Status: Impaired/Different from baseline Area of Impairment: Attention;Memory;Following commands;Problem solving;Awareness                   Current Attention Level: Selective Memory: Decreased short-term memory Following Commands: Follows multi-step commands with increased time   Awareness: Anticipatory Problem Solving: Slow processing;Decreased initiation;Difficulty sequencing;Requires verbal cues;Requires tactile cues General Comments: Pt processing is slightly faster today and she is more aware          General Comments General comments (skin integrity, edema, etc.): VSS      Pertinent Vitals/Pain Pain Assessment: Faces Faces Pain Scale: Hurts even more Pain Descriptors / Indicators: Grimacing;Guarding;Sharp;Shooting Pain Intervention(s): Limited activity within patient's tolerance;Monitored during session;Repositioned           PT Goals (current goals can now be found in the care plan section) Acute Rehab PT Goals Patient Stated Goal: have less pain PT Goal  Formulation: With patient Time For Goal Achievement: 07/09/18 Potential to Achieve Goals: Fair Progress towards PT goals: Progressing toward goals    Frequency    Min 2X/week       PT Plan Current plan remains appropriate       AM-PAC PT "6 Clicks" Mobility   Outcome Measure  Help needed turning from your back to your side while in a flat bed without using bedrails?: A Little Help needed moving from lying on your back to sitting on the side of a flat bed without using bedrails?: A Lot Help needed moving to and from a bed to a chair (including a wheelchair)?: A Lot Help needed standing up from a chair using your arms (e.g., wheelchair or bedside chair)?: A Lot Help needed to walk in hospital room?: Total Help needed climbing 3-5 steps with a railing? : Total 6 Click Score: 11    End of Session Equipment Utilized During Treatment: Gait belt Activity Tolerance: Patient limited by pain Patient left: in chair;with call bell/phone within reach Nurse Communication: Mobility status PT Visit Diagnosis: Unsteadiness on feet (R26.81);Other abnormalities of gait and mobility (R26.89);Muscle weakness (generalized) (M62.81);Difficulty in walking, not elsewhere classified (R26.2);Pain Pain - Right/Left: (bilateral) Pain - part of body: Ankle and joints of foot     Time: 1035-1100 PT Time Calculation (min) (ACUTE ONLY): 25 min  Charges:  $Gait Training: 23-37 mins                     Melvia Matousek B. Beverely Risen PT, DPT Acute Rehabilitation Services Pager [phone] Office [phone]    Elon Alas Fleet 06/26/2018, 11:27 AM

## 2018-06-26 NOTE — Discharge Summary (Addendum)
Discharge Summary  [name] ZTI:[id number] DOB: 07-14-1925  PCP: Billie Ruddy I, NP  Admit date: 06/23/2018 Discharge date: 06/26/2018   Time spent: < 25 minutes  Admitted From: ALF Disposition:  SNF  Recommendations for Outpatient Follow-up:  1. Follow up with PCP at facility in 1 week, check BMP ( Cr, K), and CBC ( hgb, wbc) 2. New medications: flexeril PRN TID added for fibromyalgia 3.     Discharge Diagnoses:  Active Hospital Problems   Diagnosis Date Noted  . Acute exacerbation of CHF (congestive heart failure) ([place]) 06/24/2018  . Acute systolic CHF (congestive heart failure) ([place]) 06/24/2018  . Symptomatic anemia 06/23/2018  . Chronic combined systolic and diastolic heart failure, NYHA class 2 ([place]) 03/08/2018  . Coronary artery disease involving native coronary artery of native heart without angina pectoris 03/08/2018  . GERD (gastroesophageal reflux disease)   . Hypertension   . Hypothyroid 01/04/2011  . Obstructive sleep apnea 01/04/2011    Resolved Hospital Problems  No resolved problems to display.    Discharge Condition: Stable   CODE STATUS:DNR   Vitals:   06/26/18 0422 06/26/18 0601  BP: (!) 145/71 (!) 141/66  Pulse: 90 90  Resp: 18   Temp: 98 F (36.7 C)   SpO2: 100%     History of present illness:  Bailey Duke is a 85 y.o. year old female with medical history significant for CHF with reduced EF, OSA on CPAP, CKD stage III, HTN, GERD,Chronic iron deficiency anemia, chronic hypercalcemia, leukocytosis, CAD, depression, goutwho presents on 5/29/2020[facility] [place] of progressive fatigue and weakness and was found to have hemoglobin of 7.3, elevated BNP of 900, pulmonary edema, peripheral edema consistent with acute CHF exacerbation and acute on chronic anemia. Remaining hospital course addressed in problem based format below:   Hospital Course:   1. Dyspnea, multifactorial etiology.  Presenting symptom of  weakness and fatigue likely related to worsening anemia and pulmonary edema related to CHF flare.  Resolved with treatment of both.  Ambulating and maintaining normal oxygen saturation on discharge. 2. Acute on chronic CHF with reduced ejection fraction exacerbation.  New EF 25-30% down from previous 40-45% (from 08/2017).  Responded well to IV Lasix diuresis, pulmonary edema resolved, no longer dyspneic.  Can transition back to home torsemide and spironolactone. Continue coreg  Recommend BMP check at facility. 3. Acute on chronic symptomatic anemia, iron deficiency anemia.  Iron panel consistent with significant deficiency, patient had no melena or active blood loss here, GI recommended expectant management with blood transfusion (1 U) and IV iron given age and comorbidities made risk of endoscopic intervention outweigh benefit.  Hemoglobin improved from 7 to 9.  9 on discharge. Continue oral iron, Recommend repeat CBC at facility 4. UTI.  Completed therapy started at facility with 2 additional days of IV ceftriaxone.  Remains asymptomatic. 5. Chronic leukocytosis.  Ongoing for 2 years, remained afebrile, likely secondary to acute inflammation related to chronic anemia.  Remained stable at baseline (12-15) recommend repeat CBC at facility (n discharge WBC 15) 6. Chronic hypercalcemia (4 years).  Remained stable at 10.4.  Previous discussions of concern for multiple myeloma in the past given anemia.  Recommend SPEP/UPEP as outpatient. 7. CAD, asymptomatic. 8. CKD stage III, stable.  Creatinine remained at baseline (1.1-1.2).  Creatinine 1.23 on discharge 9. Fibromyalgia.  Continue Cymbalta.  Added Flexeril as needed for support. Continue home tramadol PRN 10. Gout, stable.  Continue home allopurinol    Consultations:  None  Procedures/Studies:  TTE, 5/30  Stage 1: 1: Multiple segmental abnormalities exist. See findings.  2. The left ventricle has severely reduced systolic function, with an  ejection fraction of 25-30%. The cavity size was normal. There is mild concentric left ventricular hypertrophy. Left ventricular diastolic Doppler parameters are consistent with  impaired relaxation. Elevated left ventricular end-diastolic pressure Left ventrical global hypokinesis without regional wall motion abnormalities.  3. The right ventricle has normal systolc function. The cavity was normal. There is no increase in right ventricular wall thickness.  4. Left atrial size was moderately dilated.  5. The mitral valve is grossly normal. Mild thickening of the mitral valve leaflet. There is mild mitral annular calcification present. Mitral valve regurgitation is moderate to severe by color flow Doppler. The MR jet is eccentric laterally directed.  6. The tricuspid valve was grossly normal.  7. The aortic valve is tricuspid Aortic valve regurgitation is mild by color flow Doppler.  8. The aortic root is normal in size and structure.  Discharge Exam: BP (!) 141/66   Pulse 90   Temp 98 F (36.7 C)   Resp 18   Ht 5' 3"  (1.6 m)   Wt 83.1 kg   SpO2 100%   BMI 32.47 kg/m   General: Lying in bed, no apparent distress Eyes: EOMI, anicteric ENT: Oral Mucosa clear and moist Cardiovascular: regular rate and rhythm, no murmurs, rubs or gallops, no edema, Respiratory: Normal respiratory effort on room air, lungs clear to auscultation bilaterally Abdomen: soft, non-distended, non-tender, normal bowel sounds Skin: No Rash MSK: tender to palpation especially right shoulder, arm Neurologic: Grossly no focal neuro deficit.Mental status Alert and oriented to person, place, time, context, speech normal, Psychiatric:Appropriate affect, and mood   Discharge Instructions You were cared for by a hospitalist during your hospital stay. If you have any questions about your discharge medications or the care you received while you were in the hospital after you are discharged, you can call the unit and asked  to speak with the hospitalist on call if the hospitalist that took care of you is not available. Once you are discharged, your primary care physician will handle any further medical issues. Please note that NO REFILLS for any discharge medications will be authorized once you are discharged, as it is imperative that you return to your primary care physician (or establish a relationship with a primary care physician if you do not have one) for your aftercare needs so that they can reassess your need for medications and monitor your lab values.  Discharge Instructions    Diet - low sodium heart healthy   Complete by:  As directed    Increase activity slowly   Complete by:  As directed      Allergies as of 06/26/2018      Reactions   Tetanus Toxoids Other (See Comments)   RED LINE FROM INJECTION SITE DOWN ARM   Penicillins Other (See Comments)   1940 FORMS - TINY ITCHY PIMPLES ON FINGERS Did it involve swelling of the face/tongue/throat, SOB, or low BP? Unknown Did it involve sudden or severe rash/hives, skin peeling, or any reaction on the inside of your mouth or nose? Unknown Did you need to seek medical attention at a hospital or doctor's office? Unknown When did it last happen?1940? If all above answers are "NO", may proceed with cephalosporin use.   Procaine Other (See Comments)   Unknown reaction   Sulfa Antibiotics Other (See Comments)   Unknown reaction  Medication List    TAKE these medications   acetaminophen 325 MG tablet Commonly known as:  TYLENOL Take 650 mg by mouth 3 (three) times daily.   allopurinol 100 MG tablet Commonly known as:  ZYLOPRIM Take 100 mg by mouth every morning.   atorvastatin 40 MG tablet Commonly known as:  LIPITOR Take 40 mg by mouth every morning.   Biofreeze 4 % Gel Generic drug:  Menthol (Topical Analgesic) Apply 1 application topically 4 (four) times daily. For neck pain   carvedilol 3.125 MG tablet Commonly known as:   COREG Take 3.125 mg by mouth 2 (two) times daily.   cyanocobalamin 1000 MCG/ML injection Commonly known as:  (VITAMIN B-12) Inject 1,000 mcg into the muscle every 30 (thirty) days.   diclofenac sodium 1 % Gel Commonly known as:  VOLTAREN Apply 2 g topically 3 (three) times daily as needed (apply to affected area for pain).   docusate sodium 100 MG capsule Commonly known as:  COLACE Take 100 mg by mouth 2 (two) times daily.   DULoxetine 30 MG capsule Commonly known as:  CYMBALTA Take 30 mg by mouth every morning.   ferrous sulfate 325 (65 FE) MG EC tablet Take 325 mg by mouth 3 (three) times daily.   fexofenadine 60 MG tablet Commonly known as:  ALLEGRA Take 60 mg by mouth every morning.   gabapentin 100 MG capsule Commonly known as:  NEURONTIN Take 200 mg by mouth at bedtime.   Glucos-Chondroit-MSM Complex Tabs Take 1 tablet by mouth every evening.   ipratropium 0.03 % nasal spray Commonly known as:  ATROVENT Place 2 sprays into both nostrils 2 (two) times daily as needed for rhinitis.   Krill Oil 500 MG Caps Take 500 mg by mouth every morning.   levothyroxine 125 MCG tablet Commonly known as:  SYNTHROID Take 125 mcg by mouth every morning.   losartan 25 MG tablet Commonly known as:  COZAAR Take 25 mg by mouth every morning.   Melatonin 5 MG Tabs Take 5 mg by mouth See admin instructions. Take one tablet (5 mg) by mouth daily at bedtime, may repeat once during the night before 3am if unable to sleep   modafinil 200 MG tablet Commonly known as:  PROVIGIL Take 200 mg by mouth every morning.   multivitamin with minerals Tabs tablet Take 1 tablet by mouth every morning.   Myrbetriq 25 MG Tb24 tablet Generic drug:  mirabegron ER Take 25 mg by mouth daily at 12 noon.   nystatin cream Commonly known as:  MYCOSTATIN Apply 1 application topically 3 (three) times daily as needed (perineum itching).   omeprazole 40 MG capsule Commonly known as:  PRILOSEC Take  40 mg by mouth 2 (two) times a day.   ondansetron 4 MG tablet Commonly known as:  ZOFRAN Take 4 mg by mouth every 8 (eight) hours as needed for nausea or vomiting.   polyethylene glycol 17 g packet Commonly known as:  MIRALAX / GLYCOLAX Take 17 g by mouth daily as needed (constipation).   PRESCRIPTION MEDICATION Inhale into the lungs at bedtime. CPAP   PreserVision AREDS 2 Caps Take 1 capsule by mouth every evening.   SALONPAS PAIN RELIEF PATCH EX Place 1 patch onto the skin daily as needed (pain).   senna 8.6 MG Tabs tablet Commonly known as:  SENOKOT Take 1 tablet by mouth 2 (two) times a day.   spironolactone 25 MG tablet Commonly known as:  ALDACTONE Take 25 mg by mouth  3 (three) times a week.   torsemide 20 MG tablet Commonly known as:  DEMADEX Take 40 mg by mouth daily as needed (weight gain of >3lb gain in 24 hours/ shortness of breath laying flat (orthopnea)).   torsemide 10 MG tablet Commonly known as:  DEMADEX Take 10 mg by mouth every morning.   traMADol 50 MG tablet Commonly known as:  ULTRAM Take 50 mg by mouth See admin instructions. Take one tablet (50 mg) by mouth daily at bedtime, may also take one tablet (50 mg) two time daily as needed for pain      Allergies  Allergen Reactions  . Tetanus Toxoids Other (See Comments)    RED LINE FROM INJECTION SITE DOWN ARM  . Penicillins Other (See Comments)    1940 FORMS - TINY ITCHY PIMPLES ON FINGERS Did it involve swelling of the face/tongue/throat, SOB, or low BP? Unknown Did it involve sudden or severe rash/hives, skin peeling, or any reaction on the inside of your mouth or nose? Unknown Did you need to seek medical attention at a hospital or doctor's office? Unknown When did it last happen?1940? If all above answers are "NO", may proceed with cephalosporin use.  . Procaine Other (See Comments)    Unknown reaction  . Sulfa Antibiotics Other (See Comments)    Unknown reaction      The results  of significant diagnostics from this hospitalization (including imaging, microbiology, ancillary and laboratory) are listed below for reference.    Significant Diagnostic Studies: Ct Abdomen Pelvis Wo Contrast  Result Date: 06/24/2018 CLINICAL DATA:  Abdominal pain, nausea and vomiting. Diverticulitis suspected clinically. EXAM: CT ABDOMEN AND PELVIS WITHOUT CONTRAST TECHNIQUE: Multidetector CT imaging of the abdomen and pelvis was performed following the standard protocol without IV contrast. COMPARISON:  10/14/2016 FINDINGS: Lower chest: Small effusions layering dependently with dependent pulmonary atelectasis. Hepatobiliary: Liver parenchyma appears normal. No calcified gallstones. Pancreas: Normal Spleen: Normal Adrenals/Urinary Tract: Adrenal glands are normal. No evidence of renal mass or obstruction. There is some vascular calcification. No renal calculi are seen. Bladder appears normal as seen with regional artifact from hip replacements. Stomach/Bowel: No evidence of ileus or obstruction. Patient has mild diverticulosis, but no evidence of diverticulitis. Vascular/Lymphatic: Aortic atherosclerosis. No aneurysm. IVC is normal. No retroperitoneal adenopathy. Reproductive: No pelvic mass. Other: No free fluid or air. Musculoskeletal: Chronic lower lumbar degenerative changes including anterolisthesis at L4-5 of 1 cm and spinal stenosis. Old superior endplate compression deformity at L2. IMPRESSION: No acute finding.  No evidence of diverticulitis. Small pleural effusions layering dependently. Electronically Signed   By: Nelson Chimes M.D.   On: 06/24/2018 11:57   Dg Chest Port 1 View  Result Date: 06/25/2018 CLINICAL DATA:  CHF EXAM: PORTABLE CHEST 1 VIEW COMPARISON:  06/23/2018 chest radiograph. FINDINGS: Stable cardiomediastinal silhouette with mild cardiomegaly. No pneumothorax. No pleural effusion. No pulmonary edema. Mild left basilar atelectasis. IMPRESSION: Cardiomegaly without pulmonary edema.  Mild left basilar atelectasis. Electronically Signed   By: Ilona Sorrel M.D.   On: 06/25/2018 13:45   Dg Chest Port 1 View  Result Date: 06/23/2018 CLINICAL DATA:  Dyspnea EXAM: PORTABLE CHEST 1 VIEW COMPARISON:  12/26/2016 chest radiograph. FINDINGS: Stable cardiomediastinal silhouette with mild cardiomegaly. No pneumothorax. No pleural effusion. Mild pulmonary edema. IMPRESSION: Mild congestive heart failure. Electronically Signed   By: Ilona Sorrel M.D.   On: 06/23/2018 21:37    Microbiology: Recent Results (from the past 240 hour(s))  SARS Coronavirus 2 (CEPHEID - Performed in [facility]  hospital lab), Hosp Order     Status: None   Collection Time: 06/23/18  5:29 PM  Result Value Ref Range Status   SARS Coronavirus 2 NEGATIVE NEGATIVE Final    Comment: (NOTE) If result is NEGATIVE SARS-CoV-2 target nucleic acids are NOT DETECTED. The SARS-CoV-2 RNA is generally detectable in upper and lower  respiratory specimens during the acute phase of infection. The lowest  concentration of SARS-CoV-2 viral copies this assay can detect is 250  copies / mL. A negative result does not preclude SARS-CoV-2 infection  and should not be used as the sole basis for treatment or other  patient management decisions.  A negative result may occur with  improper specimen collection / handling, submission of specimen other  than nasopharyngeal swab, presence of viral mutation(s) within the  areas targeted by this assay, and inadequate number of viral copies  (<250 copies / mL). A negative result must be combined with clinical  observations, patient history, and epidemiological information. If result is POSITIVE SARS-CoV-2 target nucleic acids are DETECTED. The SARS-CoV-2 RNA is generally detectable in upper and lower  respiratory specimens dur ing the acute phase of infection.  Positive  results are indicative of active infection with SARS-CoV-2.  Clinical  correlation with patient history and other  diagnostic information is  necessary to determine patient infection status.  Positive results do  not rule out bacterial infection or co-infection with other viruses. If result is PRESUMPTIVE POSTIVE SARS-CoV-2 nucleic acids MAY BE PRESENT.   A presumptive positive result was obtained on the submitted specimen  and confirmed on repeat testing.  While 2019 novel coronavirus  (SARS-CoV-2) nucleic acids may be present in the submitted sample  additional confirmatory testing may be necessary for epidemiological  and / or clinical management purposes  to differentiate between  SARS-CoV-2 and other Sarbecovirus currently known to infect humans.  If clinically indicated additional testing with an alternate test  methodology [phone]) is advised. The SARS-CoV-2 RNA is generally  detectable in upper and lower respiratory sp ecimens during the acute  phase of infection. The expected result is Negative. Fact Sheet for Patients:  StrictlyIdeas.no Fact Sheet for Healthcare Providers: BankingDealers.co.za This test is not yet approved or cleared by the [place] FDA and has been authorized for detection and/or diagnosis of SARS-CoV-2 by FDA under an Emergency Use Authorization (EUA).  This EUA will remain in effect (meaning this test can be used) for the duration of the COVID-19 declaration under Section 564(b)(1) of the Act, 21 U.S.C. section 360bbb-3(b)(1), unless the authorization is terminated or revoked sooner. Performed at [facility], [place] [address], [place], [place] [address]      Labs: Basic Metabolic Panel: Recent Labs  Lab 06/23/18 1547 06/23/18 2306 06/24/18 0634 06/25/18 0539 06/26/18 0629  NA 136  --  137 134* 131*  K 4.3  --  3.6 3.7 3.4*  CL 102  --  97* 98 94*  CO2 23  --  26 25 25   GLUCOSE 127*  --  121* 119* 166*  BUN 21  --  17 17 20   CREATININE 1.29*  --  1.04* 1.24* 1.23*  CALCIUM 10.5*  --  10.7* 10.5*  10.4*  MG  --  2.2  --   --   --    Liver Function Tests: Recent Labs  Lab 06/23/18 1547 06/24/18 0634  AST 42* 35  ALT 42 38  ALKPHOS 111 111  BILITOT 0.3 0.6  PROT 5.8* 5.4*  ALBUMIN 2.4* 2.1*   No results for input(s): LIPASE, AMYLASE in the last 168 hours. No results for input(s): AMMONIA in the last 168 hours. CBC: Recent Labs  Lab 06/23/18 1555 06/23/18 2306 06/24/18 0634 06/24/18 1341 06/25/18 0539 06/26/18 0629  WBC 14.7* 17.2* 14.8* 13.7* 13.7* 15.1*  NEUTROABS 8.8*  --   --   --   --  9.8*  HGB 7.2* 9.6* 8.3* 8.9* 8.4* 9.0*  HCT 23.7* 30.3* 25.6* 26.8* 26.1* 28.6*  MCV 88.8 86.1 85.3 84.0 85.9 86.1  PLT 462* 539* 445* 481* 444* 459*   Cardiac Enzymes: No results for input(s): CKTOTAL, CKMB, CKMBINDEX, TROPONINI in the last 168 hours. BNP: BNP (last 3 results) Recent Labs    06/23/18 2306  BNP 911.5*    ProBNP (last 3 results) No results for input(s): PROBNP in the last 8760 hours.  CBG: Recent Labs  Lab 06/25/18 0746 06/26/18 0605  GLUCAP 98 120*       Signed:  Desiree Hane, MD [facility] 06/26/2018, 8:38 AM

## 2018-06-30 LAB — SAVE SMEAR(SSMR), FOR PROVIDER SLIDE REVIEW

## 2018-11-13 ENCOUNTER — Telehealth: Payer: Self-pay | Admitting: *Deleted

## 2018-11-13 NOTE — Telephone Encounter (Signed)
Patient ask if we would call her some other time.

## 2018-11-28 ENCOUNTER — Ambulatory Visit: Payer: Medicare Other | Admitting: Cardiology

## 2018-12-12 ENCOUNTER — Other Ambulatory Visit: Payer: Self-pay

## 2018-12-12 ENCOUNTER — Ambulatory Visit: Payer: Medicare Other | Admitting: Cardiology

## 2018-12-12 ENCOUNTER — Encounter: Payer: Self-pay | Admitting: Cardiology

## 2018-12-12 VITALS — BP 131/77 | HR 94 | Temp 96.6°F | Ht 63.5 in | Wt 192.0 lb

## 2018-12-12 DIAGNOSIS — I5042 Chronic combined systolic (congestive) and diastolic (congestive) heart failure: Secondary | ICD-10-CM | POA: Diagnosis not present

## 2018-12-12 DIAGNOSIS — Z7189 Other specified counseling: Secondary | ICD-10-CM

## 2018-12-12 DIAGNOSIS — I2510 Atherosclerotic heart disease of native coronary artery without angina pectoris: Secondary | ICD-10-CM | POA: Diagnosis not present

## 2018-12-12 DIAGNOSIS — I255 Ischemic cardiomyopathy: Secondary | ICD-10-CM | POA: Diagnosis not present

## 2018-12-12 DIAGNOSIS — I10 Essential (primary) hypertension: Secondary | ICD-10-CM | POA: Diagnosis not present

## 2018-12-12 DIAGNOSIS — E785 Hyperlipidemia, unspecified: Secondary | ICD-10-CM

## 2018-12-12 NOTE — Patient Instructions (Addendum)
Medication Instructions:  discontinue atorvastatin  Sliding scale Lasix: Weigh yourself when you get home, then Daily in the Morning. Your dry weight will be what your scale says on the day you return home.(here is 184  lbs.).   If you gain more than 3 pounds from dry weight: Increase the TORESMIDE dosing to 40  mg in the morning auntil weight returns to baseline dry weight 184 LBS . ( IF PATIENT TAKES 40 MG OF TORSEMIDE FOR THAN 3 OR MORE PATIENT NEEDS TO HAVE AN bmp TO FOLLOW UP KIDNEY FUNCTION     If the weight goes down more than 3 pounds from dry weight: Hold TORSEMIDE  FOR 1 DAY  THEN RETURN TO 20 MG DAILY.  *If you need a refill on your cardiac medications before your next appointment, please call your pharmacy*  Lab Work: NEEDS CBC ,BMP PLEASE FAX RESULTS TO [facility]  Testing/Procedures: Not needed  Follow-Up: At [facility], you and your health needs are our priority.  As part of our continuing mission to provide you with exceptional heart care, we have created designated Provider Care Teams.  These Care Teams include your primary Cardiologist (physician) and Advanced Practice Providers (APPs -  Physician Assistants and Nurse Practitioners) who all work together to provide you with the care you need, when you need it.  Your next appointment:   6 month(s)  The format for your next appointment:   In Person  Provider:   Jory Sims, DNP, ANP  Other Instructions  PLACE SUPPORT HOSE DAILY FOR SWELLING AND REMOVE EACH NIGHT.  PLEASE DO DAILY WEIGHT - DAILY  SEE ABOVE   PLEASE ASSIST PATIENT  WITH GOALS OF CARE - MUST SIGN FORM)

## 2018-12-12 NOTE — Progress Notes (Signed)
Primary Care Provider: Vernon Prey, NP Cardiologist: No primary care provider on file. Electrophysiologist:   Clinic Note: Chief Complaint  Patient presents with  . Hospitalization Follow-up    Admitted with CHF and anemia  . Cardiomyopathy    Significant swelling, fatigue and other heart failure symptoms    HPI:    Bailey Duke is a 85 y.o. female with a PMH below who presents today for delayed hospital follow-up -> admitted with significant anemia and CHF.Marland Kitchen   PMH of CAD (100% LAD CTO) & DHF (ICM - EF 40-45% -> down to 25-35% by recent Echo), HTN, OSA-CPAP (borderline compliance & CKD-III who presents to establish local cardiologist for her CHRONIC ISCHEMIC CARDIOMYOPATHY AND CONGESTIVE HEART FAILURE.   Macario Carls, PhD was seen here for her initial visit in Feb 2019 -> limited by back, hip and knee pain from arthritis and fibromyalgia.  Also the right shoulder.  Mild orthopnea, uses CPAP.  Wears TED hose for edema.  Was on torsemide and spironolactone.  Noted significant fatigue on call shortness of breath.   Sliding scale torsemide  Recent Hospitalizations:   5/29 - 06/26/18: Progressive fatigue & weakness - Sx anemia - Hgb 7.3. BNP 900 (CXR - pulm edema)  IV Lasix - resolved  Pulm Edema - back to PO torsemide & spironolactone.   Anemia - Fe deficient - > 1 U PRBC & IV Iron. - Hgb up to 9.  UTI - Rocephin  Reviewed  CV studies:    The following studies were reviewed today: (if available, images/films reviewed: From Epic Chart or Care Everywhere) . Echo 06/24/2018: EF 25-30% (down from 40-45%) . Severely reduced Fxn.  Global HK (no obvious RWMA). Mod LA dilation (Gr1-2 DD). Mod-Severe MR.   Interval History:   Bailey Duke was discharged from the hospital and is no longer in the assisted living section.  She is currently [facility] which is the skilled nursing facility.  She indicates that she does not seem to be getting her medications in the right time.  She does  not get much in the way of any rehab.  She walks with a walker but is unable to 100 feet.  She has pretty significant amount of edema -> she says that her baseline dry weight when she got out of the hospital about 184pounds.  She does have some PND and orthopnea symptoms as well but no chest pain or pressure.  Interestingly, she is not wearing the support hose that she usually wears, also indicating that she does not get much help with this.  She is on continuous oxygen now and does have increased work of breathing.  She says she is constantly feeling sleepy and dizzy.  She says that the night before she came in today she fell off the bed after sitting up and asking for assistance with standing up.  She fell forward.  She had about 5 falls that she can recall.  The usually falling out of bed when she tries stand up.  No rapid irregular heartbeats or palpitations.  Not walking enough for claudication.  Although she feels lightheaded and dizziness that would be potentially near syncopal, he she has not had true syncope.  No TIA or amaurosis fugax symptoms.  She says that since the hospitalization her speech has been somewhat slowed.  CV Review of Symptoms (Summary) Cardiovascular ROS: positive for - dyspnea on exertion, edema, orthopnea, paroxysmal nocturnal dyspnea, shortness of breath and Dizziness/lightheadedness-near syncope, falls, fatigue negative for -  chest pain, irregular heartbeat, palpitations, rapid heart rate or TIA/amaurosis fugax.  The patient does not have symptoms concerning for COVID-19 infection (fever, chills, cough, or new shortness of breath).  The patient is practicing social distancing. ++ Masking when in public.  Is otherwise in skilled nursing facility  Perhaps the overriding theme of this visit was Tyla indicating that she is tired of "existing ".  She is not able to do what she wants to do, and said multiple times that she "wants to go ".  She indicated that she may very  well want to avoid future hospitalizations, but is not sure if she has a CODE STATUS form filled out.  I spent almost 15 minutes discussing goals of care recommendations.  This is in addition to the routine time spent with the patient with multiple questions and hospital follow-up.    REVIEWED OF SYSTEMS   A comprehensive ROS was performed. Review of Systems  Constitutional: Positive for malaise/fatigue (Chronic fatigue-acute on chronic).  HENT: Negative for congestion and nosebleeds.   Respiratory: Positive for shortness of breath. Negative for cough.   Cardiovascular: Positive for orthopnea, leg swelling (With leg aching.) and PND.  Gastrointestinal: Positive for constipation (Off and on). Negative for abdominal pain, blood in stool, heartburn and melena.  Genitourinary: Positive for frequency. Negative for hematuria.  Musculoskeletal: Positive for back pain, falls, joint pain and myalgias.  Neurological: Positive for dizziness, weakness (Legs and arms feel weak) and headaches. Negative for tingling, focal weakness and loss of consciousness.  Psychiatric/Behavioral: Positive for depression and memory loss. Negative for suicidal ideas (No suicidal thoughts, but on several occasions indicates that she is "ready to go " -> start anywhere out of energy.). The patient is nervous/anxious and has insomnia.   All other systems reviewed and are negative.  I have reviewed and (if needed) personally updated the patient's problem list, medications, allergies, past medical and surgical history, social and family history.   PAST MEDICAL HISTORY   Past Medical History:  Diagnosis Date  . B12 deficiency anemia   . Benign paroxysmal vertigo   . Chronic combined systolic and diastolic CHF, NYHA class 2 (HCC)    On spironolactone and torsemide; losartan and carvedilol at low-dose.  . Chronic kidney disease, stage III (moderate)   . Coronary artery disease, occlusive 07/2014   LAD 100% subtotal CTO  with left to left collaterals.  Mild disease elsewhere. --Not thought to be PCI amenable due to aneurysmal anterior wall  . Essential hypertension   . GERD (gastroesophageal reflux disease)   . Hyperlipidemia   . Hypersomnia   . Hypertension   . Hypothyroid   . Ischemic cardiomyopathy 07/2014   Occluded LAD with initial EF of 30 to 35% and anterior to apical akinesis.  Most recent EF (August 2019) was 40 to 45%.  Marland Kitchen OAB (overactive bladder)   . Osteoarthritis   . Proliferative retinopathy of left eye    Non-Diabetic  . Sleep apnea    on CPAP qhs  . Spinal stenosis   . Systolic CHF (HCC)   . Vitamin D deficiency     PAST SURGICAL HISTORY   Past Surgical History:  Procedure Laterality Date  . adenoidectomy    . Bilateral total hip replacements    . Bilateral Total Knee replacements    . CATARACT EXTRACTION, BILATERAL    . COLECTOMY    . COLON SURGERY    . COLONOSCOPY    . Cyst and partial ovary removed    .  eyebrow and eyelid surgery    . JOINT REPLACEMENT    . LEFT HEART CATH AND CORONARY ANGIOGRAPHY  07/2014   [place] ([facility]): LMCA: Mild luminal irregularities <20%.  LAD: 100% ( sub totaled) L--L collaterals. LCx: Mild luminal irregularities <20%, RCA: Mild luminal irregularities <20%. EF ~30% anterior-apical Akinesis. --> sent for viability assessment. --> felt that the anterior wall is ~ aneurysmal & likely mostly Infarct.  . LUMBAR Gerster    . NM MYOVIEW LTD  05/2016   [facility] [facility]: Large anterior apical infarct. Difficult to exclude mild peri-infract ischemia.  EF ~32% - paradoxical septum. Read as High Risk b/c size of defect & low EF.  . Right Wrist and hand surgery    . STAPEDECTOMY    . TRANSTHORACIC ECHOCARDIOGRAM  08/2017   [facility] [facility]: EF 40-45%. Akinetic Anteroapical wall.  Mild MR/TR.;; TTE December 16, 2016: EF 35-40%.  Anteroseptal, mid-distal anterior wall and apex akinetic and thinned consistent with old infarct.  Mild MR and TR.  No  significant change from May 2018.  Marland Kitchen Uterus polypectomy    . Varicose Vein Surgery on left      TTE 08/2017 [facility]): EF 40-45%. Akinetic Anteroapical wall.  Mild MR/TR. ? TTE December 16, 2016: EF 35-40%.  Anteroseptal, mid-distal anterior wall and apex akinetic and thinned consistent with old infarct.  Mild MR and TR.  No significant change from May 2018.  Cath 07/2014; LMCA: Mild luminal irregularities <20%.  LAD: 100% ( sub totaled) L--L collaterals. LCx: Mild luminal irregularities <20%, RCA: Mild luminal irregularities <20%. EF ~30% anterior-apical Akinesis. --> sent for viability assessment. --> felt that the anterior wall is ~ aneurysmal & likely mostly Infarct.  Nuc Med - 06/22/2016 - Large anterior apical infarct. Difficult to exclude mild peri-infract ischemia --no definitive reversibility.  EF ~32% - paradoxical septum. Read as High Risk b/c size of defect & low EF.     MEDICATIONS/ALLERGIES   Current Meds  Medication Sig  . acetaminophen (TYLENOL) 325 MG tablet Take 650 mg by mouth 3 (three) times daily.   Marland Kitchen allopurinol (ZYLOPRIM) 100 MG tablet Take 100 mg by mouth every morning.   Marland Kitchen atorvastatin (LIPITOR) 40 MG tablet Take 40 mg by mouth every morning.   . carvedilol (COREG) 3.125 MG tablet Take 3.125 mg by mouth 2 (two) times daily.   . cyanocobalamin (,VITAMIN B-12,) 1000 MCG/ML injection Inject 1,000 mcg into the muscle every 30 (thirty) days.  . diclofenac sodium (VOLTAREN) 1 % GEL Apply 2 g topically 3 (three) times daily as needed (apply to affected area for pain).  Marland Kitchen docusate sodium (COLACE) 100 MG capsule Take 100 mg by mouth 2 (two) times daily.  . DULoxetine (CYMBALTA) 30 MG capsule Take 30 mg by mouth every morning.  . ferrous sulfate 325 (65 FE) MG EC tablet Take 325 mg by mouth 3 (three) times daily.  . fexofenadine (ALLEGRA) 60 MG tablet Take 60 mg by mouth every morning.   . gabapentin (NEURONTIN) 100 MG capsule Take 200 mg by mouth at bedtime.   Marland Kitchen ipratropium  (ATROVENT) 0.03 % nasal spray Place 2 sprays into both nostrils 2 (two) times daily as needed for rhinitis.  Javier Docker Oil 500 MG CAPS Take 500 mg by mouth every morning.   Marland Kitchen levothyroxine (SYNTHROID, LEVOTHROID) 125 MCG tablet Take 125 mcg by mouth every morning.   . Liniments (SALONPAS PAIN RELIEF PATCH EX) Place 1 patch onto the skin daily as needed (pain).  Marland Kitchen  losartan (COZAAR) 25 MG tablet Take 25 mg by mouth every morning.   . Melatonin 5 MG TABS Take 5 mg by mouth See admin instructions. Take one tablet (5 mg) by mouth daily at bedtime, may repeat once during the night before 3am if unable to sleep  . Menthol, Topical Analgesic, (BIOFREEZE) 4 % GEL Apply 1 application topically 4 (four) times daily. For neck pain  . mirabegron ER (MYRBETRIQ) 25 MG TB24 tablet Take 25 mg by mouth daily at 12 noon.  . Misc Natural Products (GLUCOS-CHONDROIT-MSM COMPLEX) TABS Take 1 tablet by mouth every evening.  . modafinil (PROVIGIL) 200 MG tablet Take 200 mg by mouth every morning.   . Multiple Vitamin (MULTIVITAMIN WITH MINERALS) TABS tablet Take 1 tablet by mouth every morning.  . Multiple Vitamins-Minerals (PRESERVISION AREDS 2) CAPS Take 1 capsule by mouth every evening.  . nystatin cream (MYCOSTATIN) Apply 1 application topically 3 (three) times daily as needed (perineum itching).  . ondansetron (ZOFRAN) 4 MG tablet Take 4 mg by mouth every 8 (eight) hours as needed for nausea or vomiting.  . polyethylene glycol (MIRALAX / GLYCOLAX) 17 g packet Take 17 g by mouth daily as needed (constipation).  Marland Kitchen PRESCRIPTION MEDICATION Inhale into the lungs at bedtime. CPAP  . senna (SENOKOT) 8.6 MG TABS tablet Take 1 tablet by mouth 2 (two) times a day.  . spironolactone (ALDACTONE) 25 MG tablet Take 25 mg by mouth 3 (three) times a week.   . torsemide (DEMADEX) 20 MG tablet Take 40 mg by mouth daily as needed (weight gain of >3lb gain in 24 hours/ shortness of breath laying flat (orthopnea)).  Marland Kitchen traMADol (ULTRAM) 50  MG tablet Take 50 mg by mouth See admin instructions. Take one tablet (50 mg) by mouth daily at bedtime, may also take one tablet (50 mg) two time daily as needed for pain   Currently taking torsemide 40 mg Monday Wednesday Friday and 20 mg other days.  Currently taking spironolactone 3 days a week.   Allergies  Allergen Reactions  . Tetanus Toxoids Other (See Comments)    RED LINE FROM INJECTION SITE DOWN ARM  . Penicillins Other (See Comments)    1940 FORMS - TINY ITCHY PIMPLES ON FINGERS Did it involve swelling of the face/tongue/throat, SOB, or low BP? Unknown Did it involve sudden or severe rash/hives, skin peeling, or any reaction on the inside of your mouth or nose? Unknown Did you need to seek medical attention at a hospital or doctor's office? Unknown When did it last happen?1940? If all above answers are "NO", may proceed with cephalosporin use.  . Procaine Other (See Comments)    Unknown reaction  . Sulfa Antibiotics Other (See Comments)    Unknown reaction    SOCIAL HISTORY/FAMILY HISTORY   Social History   Tobacco Use  . Smoking status: Never Smoker  . Smokeless tobacco: Never Used  Substance Use Topics  . Alcohol use: Yes    Alcohol/week: 2.0 standard drinks    Types: 2 Shots of liquor per week    Comment: rum  . Drug use: No   Social History   Social History Narrative   Widowed.   Former Holiday representative --    PhD   Resident at [employer] at [facility].   Walks with a rolling walker.   Uses CPAP at night.   Walks back and forth to [employer] and around the [employer], but does not do routine exercise.  Family History family history includes Breast cancer in her mother; Congestive Heart Failure in her maternal grandmother; Diabetes in her maternal grandmother and mother; Other in her father; Stroke in her father.   OBJCTIVE -PE, EKG, labs   Wt Readings from Last 3 Encounters:  12/12/18 192 lb (87.1 kg)   06/26/18 183 lb 4.7 oz (83.1 kg)  03/01/18 193 lb (87.5 kg)    Physical Exam: BP 131/77   Pulse 94   Temp (!) 96.6 F (35.9 C)   Ht 5' 3.5" (1.613 m)   Wt 192 lb (87.1 kg)   SpO2 100%   BMI 33.48 kg/m  Physical Exam  Constitutional: She is oriented to person, place, and time. She appears well-nourished. No distress.  Mildly ill-appearing, nontoxic.  Well-groomed.  HENT:  Head: Normocephalic and atraumatic.  Neck: Normal range of motion. Neck supple. Hepatojugular reflux and JVD (Roughly 8-10 cm H2O) present. Carotid bruit is not present (Normal carotid upstroke.).  Cardiovascular: Normal rate, regular rhythm, S1 normal and S2 normal.  Occasional extrasystoles are present. PMI is not displaced (Unable to palpate). Exam reveals gallop, S4, distant heart sounds and decreased pulses (Mostly because of pedal edema.  Palpable but not strong). Exam reveals no friction rub.  Murmur heard.  Medium-pitched blowing decrescendo holosystolic murmur is present with a grade of 1/6 at the upper right sternal border and lower right sternal border. Pulmonary/Chest: She has wheezes.  Increased work of breathing.  On oxygen.  Diffuse coarse breath sounds.  Unable to distinguish rales or rhonchi.  Nondistressed.  Abdominal: Soft. Bowel sounds are normal. There is no rebound.  Soft/NT/ND /NABS.  Musculoskeletal: Normal range of motion.        General: Edema (2-3+ BLE from knees down.) present.  Neurological: She is alert and oriented to person, place, and time.  Psychiatric: She has a normal mood and affect. Her behavior is normal. Judgment and thought content normal.  Although somewhat slow response to questions and mentation.  Vitals reviewed.    Adult ECG Report  Rate: 94 ;  Rhythm: normal sinus rhythm and LVH.  Cannot exclude anterior Mycamine to determine.;   Narrative Interpretation: Stable EKG  Recent Labs: From Care Everywhere Na+ 137, K+ 3.9, CL -105, Ca++ 10.8.,  Glucose 101, Cr 1.20  Lab Results  Component Value Date   CHOL 205 (H) 09/16/2014   HDL 62 09/16/2014   LDLCALC 131 (H) 09/16/2014   TRIG 59 09/16/2014   CHOLHDL 3.3 09/16/2014    Lab Results  Component Value Date   CREATININE 1.23 (H) 06/26/2018   BUN 20 06/26/2018   NA 131 (L) 06/26/2018   K 3.4 (L) 06/26/2018   CL 94 (L) 06/26/2018   CO2 25 06/26/2018    ASSESSMENT/PLAN    Problem List Items Addressed This Visit    Chronic combined systolic and diastolic heart failure, NYHA class 2 (HCC) - Primary (Chronic)    She currently appears to be about 8 pounds up from what her dry weight is at home.  She does have pretty significant of edema I think a lot of this could potentially handled with her support stockings.   I have written instructions on the [place] recommend her support/TED hose to be placed daily.  With her being on diuretic and anticoagulation, I have asked for CBC and BMP to be checked.  Sliding scale torsemide:  -She is to be weighed daily -in the morning.   Your dry weight will be what your scale  says on the day you return home.(here is 184  lbs.).   If you gain more than 3 pounds from dry weight: Increase the TORESMIDE dosing to 40  mg in the morning auntil weight returns to baseline dry weight 184 LBS . ( IF PATIENT TAKES 40 MG OF TORSEMIDE FOR THAN 3 OR MORE PATIENT NEEDS TO HAVE AN bmp TO FOLLOW UP KIDNEY FUNCTION    If the weight goes down more than 3 pounds from dry weight: Hold TORSEMIDE  FOR 1 DAY  THEN RETURN TO 20 MG DAILY.  If 40 g of torsemide taken for more than 3 days, would need to check BMP chemistry levels to follow renal function and potassium.      Relevant Orders   EKG 12-Lead (Completed)   CBC   Basic metabolic panel   Ischemic cardiomyopathy (Chronic)   Relevant Orders   EKG 12-Lead (Completed)   CBC   Basic metabolic panel   Essential hypertension (Chronic)    Blood pressures pretty stable.  Would not want to further push medications although  he theoretically could push carvedilol prior but with her having some fatigue and exercise intolerance, would hold off on titrating further. On spironolactone 25 mg which she takes 3 days a week.      Relevant Orders   EKG 12-Lead (Completed)   CBC   Basic metabolic panel   Coronary artery disease involving native coronary artery of native heart without angina pectoris (Chronic)    Occlusive CAD with 100s and LAD occlusion by last study.  No active anginal symptoms. Is on atorvastatin along with low-dose of beta-blocker and ARB.  Aspirin again not listed but she says she is taking it.  Plan for now will be to avoid ischemic evaluation unless she specifically asks for it based on symptoms.  Currently undergoing reconsideration of goals of care.      Relevant Orders   EKG 12-Lead (Completed)   CBC   Basic metabolic panel   Hyperlipidemia with target LDL less than 70 (Chronic)    Theoretically with her coronary disease at all, our target LDL should be less than 70, however with her advanced age, having some memory issues now, and generally indicating that she would like to pull back on therapy, I will have her stop her atorvastatin      Goals of care, counseling/discussion    Dr. Phillips Climes is relatively coherent today and is clearly indicating that she would not want further her measures to be taken.  She would like to be treated to me feel comfortable, but I have a suspicion that she would prefer to not go back to the hospital.  I am not sure what her current CODE STATUS is but I am quite sure that she would want to be DNR/DNI.  I have also recommended that she have goals of care talk with the provider out at [facility] to discuss the MUST form which would then partial out additional types of care.  As part of this discussion I decided to discontinue her atorvastatin.  Could also stop krill oil.  We will continue beta-blocker and ARB because of her heart failure along with spironolactone  and torsemide with sliding scale torsemide.  (15 minutes other than standard 20-25 minutes spent with the patient discussing goals of care)          COVID-19 Education: The signs and symptoms of COVID-19 were discussed with the patient and how to seek care for testing (follow up  with PCP or arrange E-visit).   The importance of social distancing was discussed today.  I spent a total of 35 minutes with the patient and chart review. >  50% of the time was spent in direct patient consultation.  Additional time spent with chart review (studies, outside notes, etc): 15 Total Time: 50 min   Current medicines are reviewed at length with the patient today.  (+/- concerns) none   Patient Instructions / Medication Changes & Studies & Tests Ordered   Patient Instructions  Medication Instructions:  discontinue atorvastatin  Sliding scale Lasix: Weigh yourself when you get home, then Daily in the Morning. Your dry weight will be what your scale says on the day you return home.(here is 184  lbs.).   If you gain more than 3 pounds from dry weight: Increase the TORESMIDE dosing to 40  mg in the morning auntil weight returns to baseline dry weight 184 LBS . ( IF PATIENT TAKES 40 MG OF TORSEMIDE FOR THAN 3 OR MORE PATIENT NEEDS TO HAVE AN bmp TO FOLLOW UP KIDNEY FUNCTION     If the weight goes down more than 3 pounds from dry weight: Hold TORSEMIDE  FOR 1 DAY  THEN RETURN TO 20 MG DAILY.  *If you need a refill on your cardiac medications before your next appointment, please call your pharmacy*  Lab Work: NEEDS CBC ,BMP PLEASE FAX RESULTS TO [phone] - [facility]  Testing/Procedures: Not needed  Follow-Up: At [facility], you and your health needs are our priority.  As part of our continuing mission to provide you with exceptional heart care, we have created designated Provider Care Teams.  These Care Teams include your primary Cardiologist (physician) and Advanced Practice  Providers (APPs -  Physician Assistants and Nurse Practitioners) who all work together to provide you with the care you need, when you need it.  Your next appointment:   6 month(s)  The format for your next appointment:   In Person  Provider:   Joni Reining, DNP, ANP  Other Instructions  PLACE SUPPORT HOSE DAILY FOR SWELLING AND REMOVE EACH NIGHT.  PLEASE DO DAILY WEIGHT - DAILY  SEE ABOVE   PLEASE ASSIST PATIENT  WITH GOALS OF CARE - MUST SIGN FORM)     Studies Ordered:   Orders Placed This Encounter  Procedures  . CBC  . Basic metabolic panel  . EKG 12-Lead     Bryan Lemma, M.D., M.S. Interventional Cardiologist   Pager # [phone] Phone # [phone] [address] [place], [place] [address]   Thank you for choosing Heartcare at [facility]!!

## 2018-12-18 ENCOUNTER — Encounter: Payer: Self-pay | Admitting: Cardiology

## 2018-12-18 DIAGNOSIS — Z7189 Other specified counseling: Secondary | ICD-10-CM | POA: Insufficient documentation

## 2018-12-18 NOTE — Assessment & Plan Note (Signed)
She currently appears to be about 8 pounds up from what her dry weight is at home.  She does have pretty significant of edema I think a lot of this could potentially handled with her support stockings.   I have written instructions on the [place] recommend her support/TED hose to be placed daily.  With her being on diuretic and anticoagulation, I have asked for CBC and BMP to be checked.  Sliding scale torsemide:  -She is to be weighed daily -in the morning.   Your dry weight will be what your scale says on the day you return home.(here is 184  lbs.).   If you gain more than 3 pounds from dry weight: Increase the TORESMIDE dosing to 40  mg in the morning auntil weight returns to baseline dry weight 184 LBS . ( IF PATIENT TAKES 40 MG OF TORSEMIDE FOR THAN 3 OR MORE PATIENT NEEDS TO HAVE AN bmp TO FOLLOW UP KIDNEY FUNCTION    If the weight goes down more than 3 pounds from dry weight: Hold TORSEMIDE  FOR 1 DAY  THEN RETURN TO 20 MG DAILY.  If 40 g of torsemide taken for more than 3 days, would need to check BMP chemistry levels to follow renal function and potassium.

## 2018-12-18 NOTE — Assessment & Plan Note (Signed)
Occlusive CAD with 100s and LAD occlusion by last study.  No active anginal symptoms. Is on atorvastatin along with low-dose of beta-blocker and ARB.  Aspirin again not listed but she says she is taking it.  Plan for now will be to avoid ischemic evaluation unless she specifically asks for it based on symptoms.  Currently undergoing reconsideration of goals of care.

## 2018-12-18 NOTE — Assessment & Plan Note (Signed)
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  She would like to be treated to me feel comfortable, but I have a suspicion that she would prefer to not go back to the hospital.  I am not sure what her current CODE STATUS is but I am quite sure that she would want to be DNR/DNI.  I have also recommended that she have goals of care talk with the provider out at [facility] to discuss the MUST form which would then partial out additional types of care.  As part of this discussion I decided to discontinue her atorvastatin.  Could also stop krill oil.  We will continue beta-blocker and ARB because of her heart failure along with spironolactone and torsemide with sliding scale torsemide.  (15 minutes other than standard 20-25 minutes spent with the patient discussing goals of care)

## 2018-12-18 NOTE — Assessment & Plan Note (Signed)
Theoretically with her coronary disease at all, our target LDL should be less than 70, however with her advanced age, having some memory issues now, and generally indicating that she would like to pull back on therapy, I will have her stop her atorvastatin

## 2018-12-18 NOTE — Assessment & Plan Note (Signed)
Blood pressures pretty stable.  Would not want to further push medications although he theoretically could push carvedilol prior but with her having some fatigue and exercise intolerance, would hold off on titrating further. On spironolactone 25 mg which she takes 3 days a week.

## 2019-05-07 NOTE — Progress Notes (Deleted)
Cardiology Office Note   Date:  05/07/2019   ID:  Bailey Duke, DOB Sep 11, 1925, MRN [id number]  PCP:  Vernon Prey, NP  Cardiologist:  Dr. Herbie Baltimore   No chief complaint on file.    History of Present Illness: Bailey Duke is a 86 y.o. female who presents for ongoing assessment and management of CAD, chronic ischemic cardiomyopathy, EF of 25%-35%, Hypertension, Systolic CHF, with other history of OSA on CPAP, fibromyalgia, and arthritis.   Was last seen by Dr. Herbie Baltimore on 12/12/2018 at which time she was having multiple complaints. Main complaints of dizziness and lightheadedness. She has fallen out of bed, and had at least 5 falls at [facility]. She is now at [employer].   She was to wear support hose, due to significant LEE. A follow up BMET and CBC were completed. Atorvastatin was discontinued. She requested care and comfort only and no invasive or ischemic testing was planned. She was to notify [employer] and our office if/when she signed a DNR, DNI form for documentation.    Past Medical History:  Diagnosis Date  . B12 deficiency anemia   . Benign paroxysmal vertigo   . Chronic combined systolic and diastolic CHF, NYHA class 2 (HCC)    On spironolactone and torsemide; losartan and carvedilol at low-dose.  . Chronic kidney disease, stage III (moderate)   . Coronary artery disease, occlusive 07/2014   LAD 100% subtotal CTO with left to left collaterals.  Mild disease elsewhere. --Not thought to be PCI amenable due to aneurysmal anterior wall  . Essential hypertension   . GERD (gastroesophageal reflux disease)   . Hyperlipidemia   . Hypersomnia   . Hypertension   . Hypothyroid   . Ischemic cardiomyopathy 07/2014   Occluded LAD with initial EF of 30 to 35% and anterior to apical akinesis.  Most recent EF (August 2019) was 40 to 45%.  Marland Kitchen OAB (overactive bladder)   . Osteoarthritis   . Proliferative retinopathy of left eye    Non-Diabetic  . Sleep apnea    on CPAP qhs  . Spinal  stenosis   . Systolic CHF (HCC)   . Vitamin D deficiency     Past Surgical History:  Procedure Laterality Date  . adenoidectomy    . Bilateral total hip replacements    . Bilateral Total Knee replacements    . CATARACT EXTRACTION, BILATERAL    . COLECTOMY    . COLON SURGERY    . COLONOSCOPY    . Cyst and partial ovary removed    . eyebrow and eyelid surgery    . JOINT REPLACEMENT    . LEFT HEART CATH AND CORONARY ANGIOGRAPHY  07/2014   [place] ([facility]): LMCA: Mild luminal irregularities <20%.  LAD: 100% ( sub totaled) L--L collaterals. LCx: Mild luminal irregularities <20%, RCA: Mild luminal irregularities <20%. EF ~30% anterior-apical Akinesis. --> sent for viability assessment. --> felt that the anterior wall is ~ aneurysmal & likely mostly Infarct.  . LUMBAR DISC SURGERY    . NM MYOVIEW LTD  05/2016   [facility] [facility]: Large anterior apical infarct. Difficult to exclude mild peri-infract ischemia.  EF ~32% - paradoxical septum. Read as High Risk b/c size of defect & low EF.  . Right Wrist and hand surgery    . STAPEDECTOMY    . TRANSTHORACIC ECHOCARDIOGRAM  08/2017   [facility] [facility]: EF 40-45%. Akinetic Anteroapical wall.  Mild MR/TR.;; TTE December 16, 2016: EF 35-40%.  Anteroseptal, mid-distal anterior wall and apex  akinetic and thinned consistent with old infarct.  Mild MR and TR.  No significant change from May 2018.  Marland Kitchen Uterus polypectomy    . Varicose Vein Surgery on left       Current Outpatient Medications  Medication Sig Dispense Refill  . acetaminophen (TYLENOL) 325 MG tablet Take 650 mg by mouth 3 (three) times daily.     Marland Kitchen allopurinol (ZYLOPRIM) 100 MG tablet Take 100 mg by mouth every morning.     Marland Kitchen atorvastatin (LIPITOR) 40 MG tablet Take 40 mg by mouth every morning.     . carvedilol (COREG) 3.125 MG tablet Take 3.125 mg by mouth 2 (two) times daily.     . cyanocobalamin (,VITAMIN B-12,) 1000 MCG/ML injection Inject 1,000 mcg into the muscle every 30 (thirty)  days.    . diclofenac sodium (VOLTAREN) 1 % GEL Apply 2 g topically 3 (three) times daily as needed (apply to affected area for pain).    Marland Kitchen docusate sodium (COLACE) 100 MG capsule Take 100 mg by mouth 2 (two) times daily.    . DULoxetine (CYMBALTA) 30 MG capsule Take 30 mg by mouth every morning.    . ferrous sulfate 325 (65 FE) MG EC tablet Take 325 mg by mouth 3 (three) times daily.    . fexofenadine (ALLEGRA) 60 MG tablet Take 60 mg by mouth every morning.     . gabapentin (NEURONTIN) 100 MG capsule Take 200 mg by mouth at bedtime.     Marland Kitchen ipratropium (ATROVENT) 0.03 % nasal spray Place 2 sprays into both nostrils 2 (two) times daily as needed for rhinitis.    Boris Lown Oil 500 MG CAPS Take 500 mg by mouth every morning.     Marland Kitchen levothyroxine (SYNTHROID, LEVOTHROID) 125 MCG tablet Take 125 mcg by mouth every morning.     . Liniments (SALONPAS PAIN RELIEF PATCH EX) Place 1 patch onto the skin daily as needed (pain).    Marland Kitchen losartan (COZAAR) 25 MG tablet Take 25 mg by mouth every morning.     . Melatonin 5 MG TABS Take 5 mg by mouth See admin instructions. Take one tablet (5 mg) by mouth daily at bedtime, may repeat once during the night before 3am if unable to sleep    . Menthol, Topical Analgesic, (BIOFREEZE) 4 % GEL Apply 1 application topically 4 (four) times daily. For neck pain    . mirabegron ER (MYRBETRIQ) 25 MG TB24 tablet Take 25 mg by mouth daily at 12 noon.    . Misc Natural Products (GLUCOS-CHONDROIT-MSM COMPLEX) TABS Take 1 tablet by mouth every evening.    . modafinil (PROVIGIL) 200 MG tablet Take 200 mg by mouth every morning.     . Multiple Vitamin (MULTIVITAMIN WITH MINERALS) TABS tablet Take 1 tablet by mouth every morning.    . Multiple Vitamins-Minerals (PRESERVISION AREDS 2) CAPS Take 1 capsule by mouth every evening.    . nystatin cream (MYCOSTATIN) Apply 1 application topically 3 (three) times daily as needed (perineum itching).    . ondansetron (ZOFRAN) 4 MG tablet Take 4 mg by  mouth every 8 (eight) hours as needed for nausea or vomiting.    . polyethylene glycol (MIRALAX / GLYCOLAX) 17 g packet Take 17 g by mouth daily as needed (constipation).    Marland Kitchen PRESCRIPTION MEDICATION Inhale into the lungs at bedtime. CPAP    . senna (SENOKOT) 8.6 MG TABS tablet Take 1 tablet by mouth 2 (two) times a day.    Marland Kitchen  spironolactone (ALDACTONE) 25 MG tablet Take 25 mg by mouth 3 (three) times a week.     . torsemide (DEMADEX) 20 MG tablet Take 40 mg by mouth daily as needed (weight gain of >3lb gain in 24 hours/ shortness of breath laying flat (orthopnea)).    Marland Kitchen traMADol (ULTRAM) 50 MG tablet Take 50 mg by mouth See admin instructions. Take one tablet (50 mg) by mouth daily at bedtime, may also take one tablet (50 mg) two time daily as needed for pain     No current facility-administered medications for this visit.    Allergies:   Tetanus toxoids, Penicillins, Procaine, and Sulfa antibiotics    Social History:  The patient  reports that she has never smoked. She has never used smokeless tobacco. She reports current alcohol use of about 2.0 standard drinks of alcohol per week. She reports that she does not use drugs.   Family History:  The patient's family history includes Breast cancer in her mother; Congestive Heart Failure in her maternal grandmother; Diabetes in her maternal grandmother and mother; Other in her father; Stroke in her father.    ROS: All other systems are reviewed and negative. Unless otherwise mentioned in H&P    PHYSICAL EXAM: VS:  There were no vitals taken for this visit. , BMI There is no height or weight on file to calculate BMI. GEN: Well nourished, well developed, in no acute distress HEENT: normal Neck: no JVD, carotid bruits, or masses Cardiac: ***RRR; no murmurs, rubs, or gallops,no edema  Respiratory:  Clear to auscultation bilaterally, normal work of breathing GI: soft, nontender, nondistended, + BS MS: no deformity or atrophy Skin: warm and dry,  no rash Neuro:  Strength and sensation are intact Psych: euthymic mood, full affect   EKG:  EKG {ACTION; IS/IS [id number]} ordered today. The ekg ordered today demonstrates ***   Recent Labs: 06/23/2018: B Natriuretic Peptide 911.5; Magnesium 2.2; TSH 2.406 06/24/2018: ALT 38 06/26/2018: BUN 20; Creatinine, Ser 1.23; Hemoglobin 9.0; Platelets 459; Potassium 3.4; Sodium 131    Lipid Panel    Component Value Date/Time   CHOL 205 (H) 09/16/2014 0020   TRIG 59 09/16/2014 0020   HDL 62 09/16/2014 0020   CHOLHDL 3.3 09/16/2014 0020   VLDL 12 09/16/2014 0020   LDLCALC 131 (H) 09/16/2014 0020      Wt Readings from Last 3 Encounters:  12/12/18 192 lb (87.1 kg)  06/26/18 183 lb 4.7 oz (83.1 kg)  03/01/18 193 lb (87.5 kg)      Other studies Reviewed:(copied from Dr. Elissa Hefty note for clarity.  TTE 08/2017 [facility] EF 40-45%. Akinetic Anteroapical wall. Mild MR/TR. ? TTE December 16, 2016:EF 35-40%. Anteroseptal, mid-distal anterior wall and apex akinetic and thinned consistent with old infarct. Mild MR and TR. No significant change from May 2018.  Cath 07/2014;LMCA: Mild luminal irregularities <20%.LAD: 100% ( sub totaled) L--L collaterals.LCx: Mild luminal irregularities <20%,RCA: Mild luminal irregularities <20%. EF ~30% anterior-apical Akinesis. --> sent for viability assessment.--> felt that the anterior wall is ~ aneurysmal & likely mostly Infarct.  Nuc Med - 06/22/2016- Large anterior apical infarct. Difficult to exclude mild peri-infract ischemia --no definitive reversibility. EF ~32% - paradoxical septum. Read as High Risk b/c size of defect &low EF.    ASSESSMENT AND PLAN:  1.  ***   Current medicines are reviewed at length with the patient today.  I have spent *** dedicated to the care of this patient on the date of this encounter to include  pre-visit review of records, assessment, management and diagnostic testing,with shared decision making.  Labs/ tests  ordered today include: *** Phill Myron. West Pugh, ANP, AACC   05/07/2019 1:22 PM    [facility] [address] Office [phone] Fax [phone]  Notice: This dictation was prepared with Dragon dictation along with smaller phrase technology. Any transcriptional errors that result from this process are unintentional and may not be corrected upon review.

## 2019-05-08 ENCOUNTER — Ambulatory Visit: Payer: Medicare Other | Admitting: Adult Health

## 2019-05-16 IMAGING — CT CT HEAD W/O CM
3 of 7 series · 13 of 47 positions shown, 15 images · non-contrast
Comparison: None.

CLINICAL DATA: Severe neck pain for 1 week. Altered level
consciousness.

EXAM:
CT HEAD WITHOUT CONTRAST
CT CERVICAL SPINE WITHOUT CONTRAST
TECHNIQUE: Multidetector CT imaging of the head and cervical spine was
performed following the standard protocol without intravenous
contrast. Multiplanar CT image reconstructions of the cervical spine
were also generated.

[Series 9: coronals · coronal · 0.34mm/px · 3 of 74 slices shown]
[im 34/74  brain]
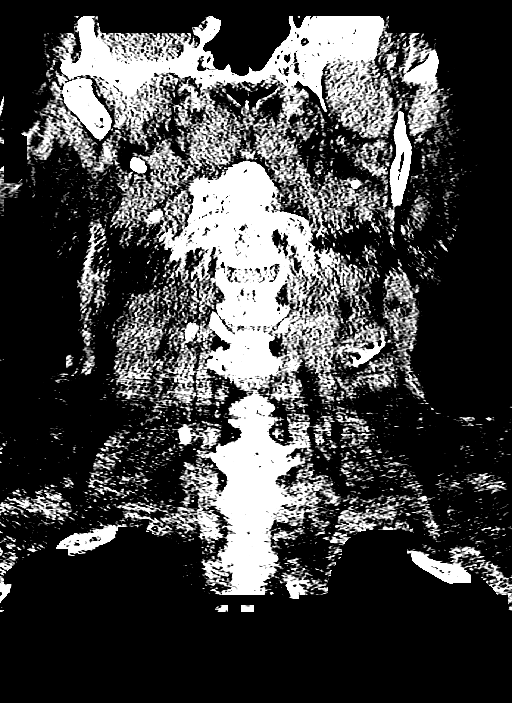
[im 47/74  brain]
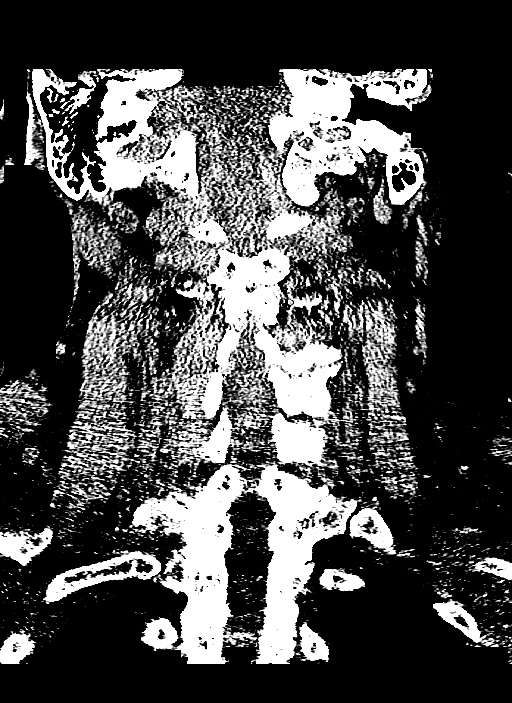
[im 60/74  brain]
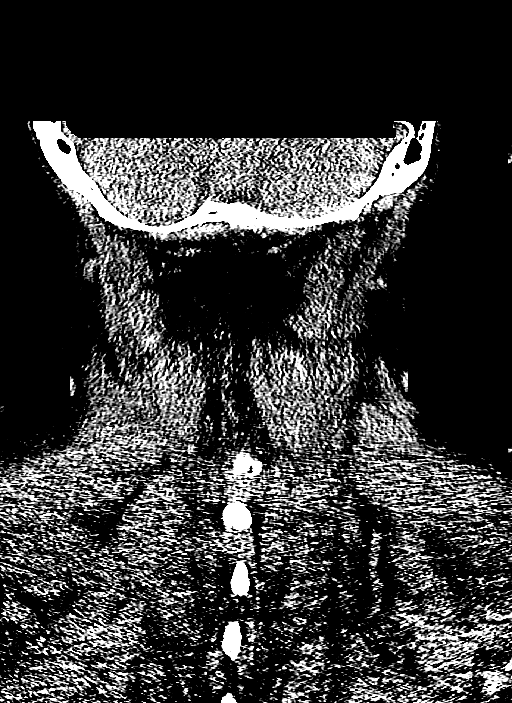

[Series 10: sagittals · sagittal · 0.38mm/px · 2 of 80 slices shown]
[im 27/80  brain]
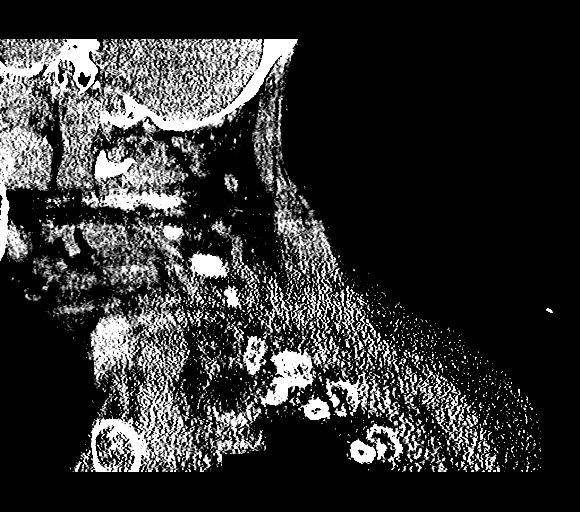
[im 53/80  brain]
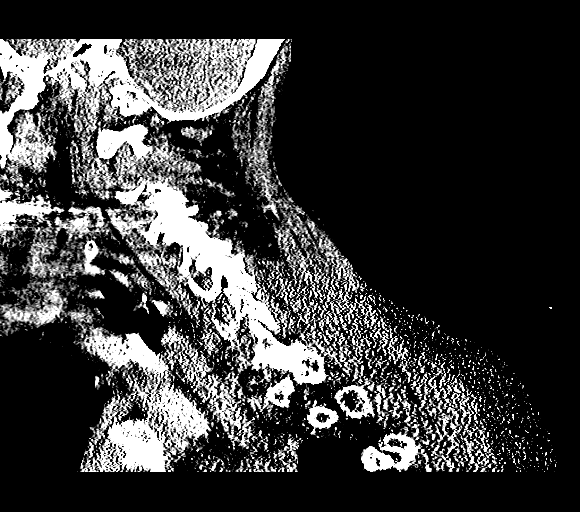

[Series 11: orthogonals · axial · 0.32mm/px · z∈[-367,-183]mm · 8 of 128 slices shown, 10 images]
[im 9/128  brain]
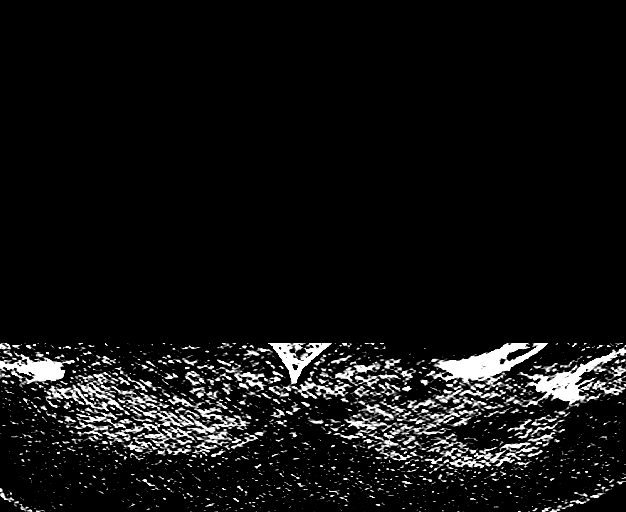
[im 9/128  bone]
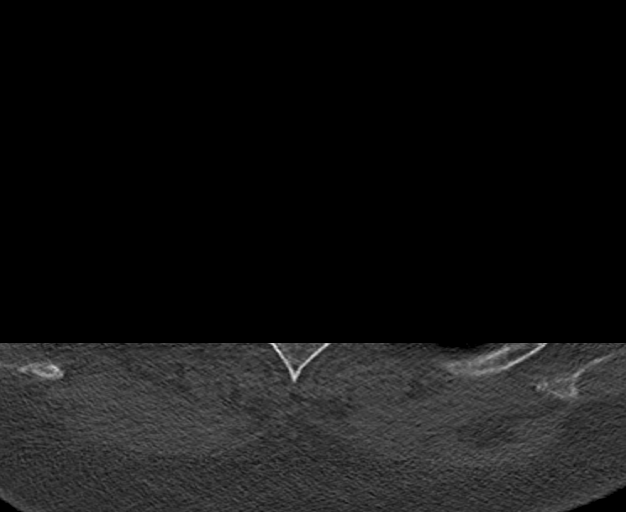
[im 26/128  brain]
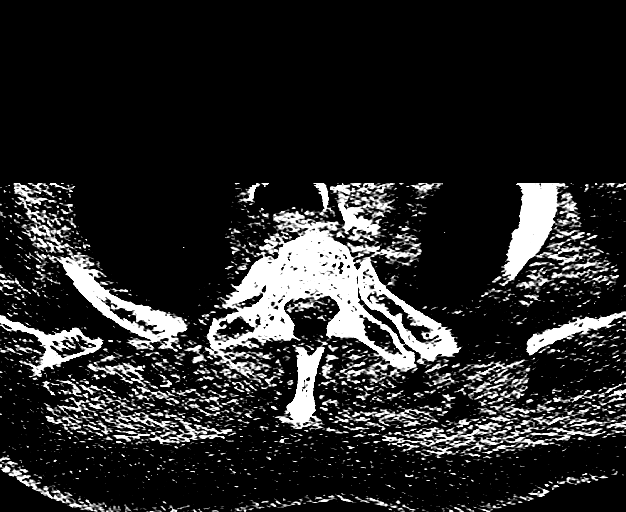
[im 43/128  brain]
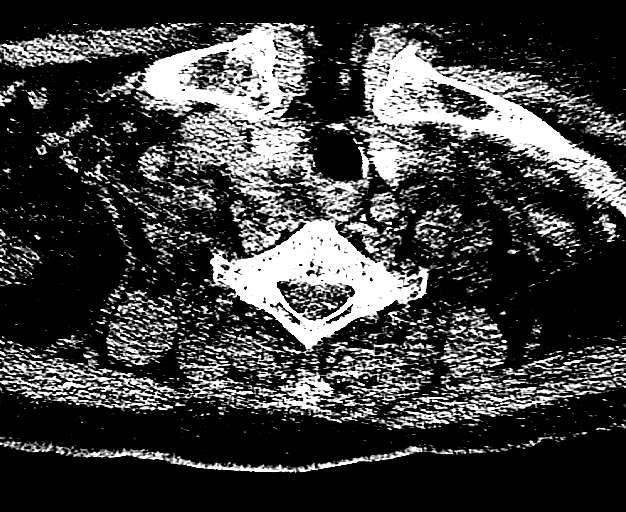
[im 60/128  brain]
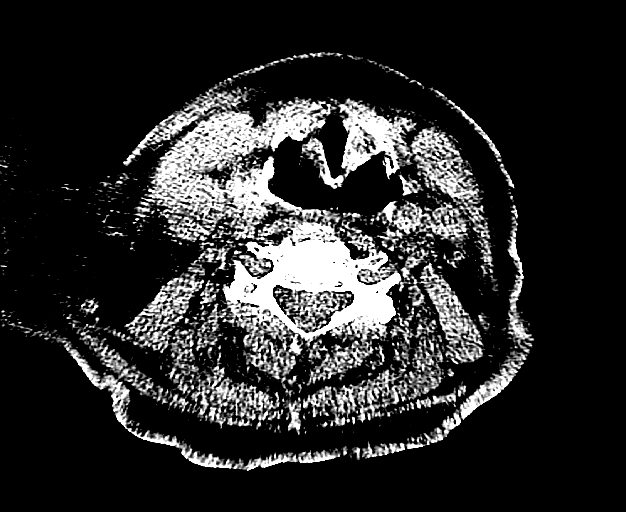
[im 68/128  brain]
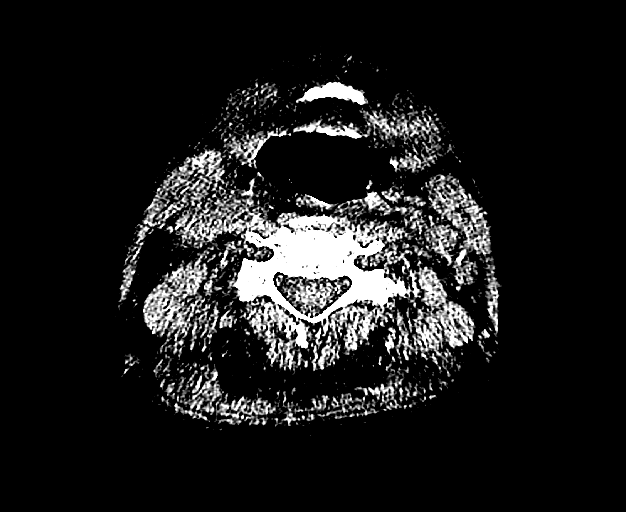
[im 68/128  bone]
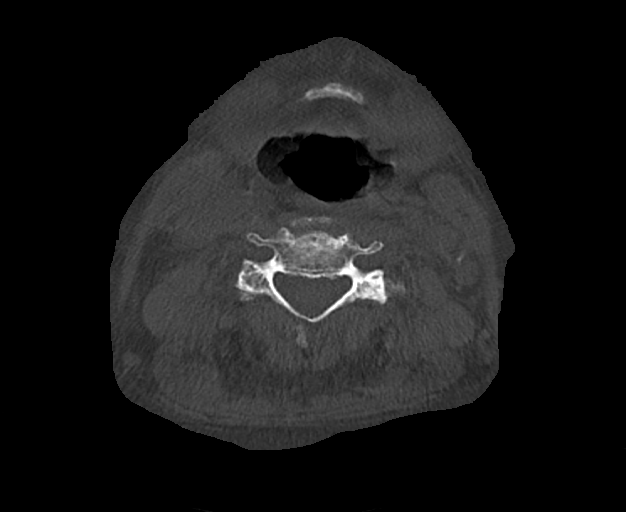
[im 85/128  brain]
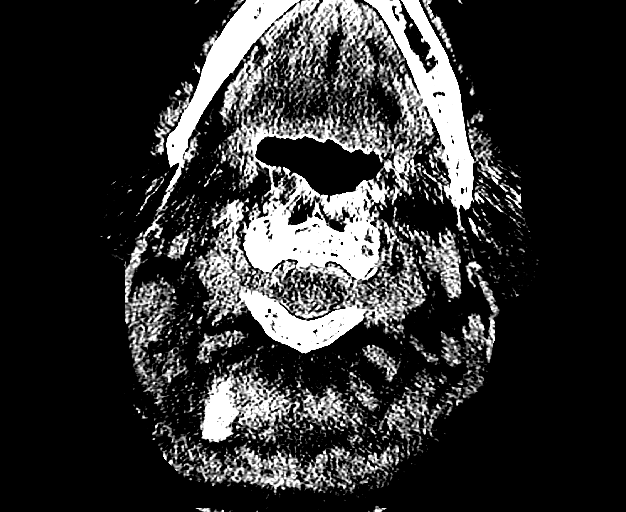
[im 102/128  brain]
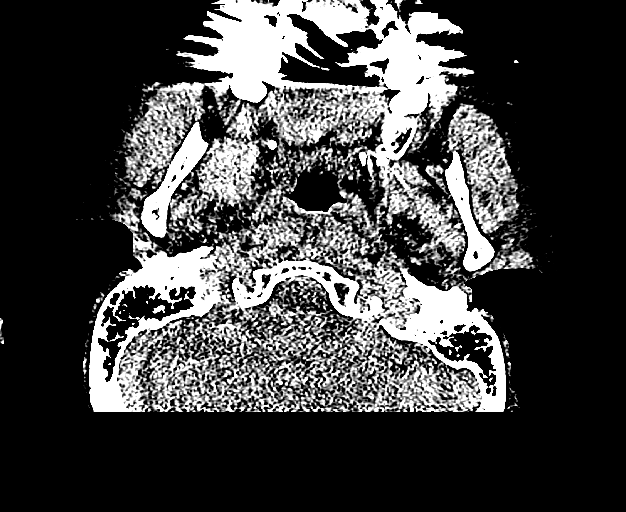
[im 119/128  brain]
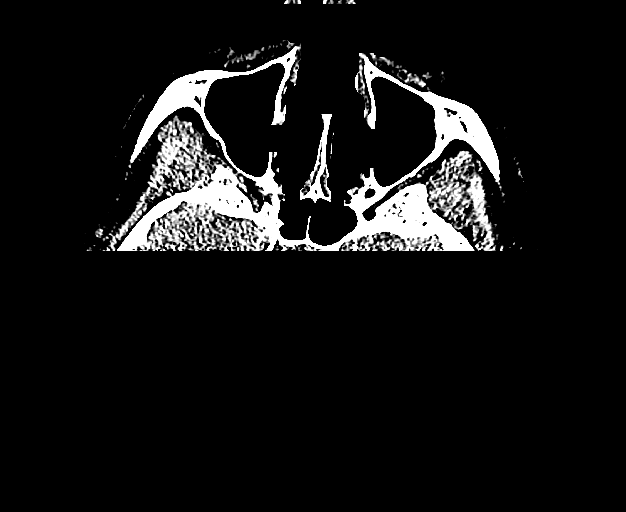

[13 of 47 positions shown; findings below may reference images not displayed]

FINDINGS: CT HEAD FINDINGS

Brain: No evidence of acute infarction, hemorrhage, extra-axial
collection, ventriculomegaly, or mass effect. Generalized cerebral
atrophy. Periventricular white matter low attenuation likely
secondary to microangiopathy.

Vascular: Cerebrovascular atherosclerotic calcifications are noted.

Skull: Negative for fracture or focal lesion.

Sinuses/Orbits: Visualized portions of the orbits are unremarkable.
Visualized portions of the paranasal sinuses and mastoid air cells
are unremarkable.

Other: None.

CT CERVICAL SPINE FINDINGS

Alignment: 6 mm anterolisthesis of C4 on C5 secondary to severe
bilateral facet arthropathy.

Skull base and vertebrae: No acute fracture. No primary bone lesion
or focal pathologic process.

Soft tissues and spinal canal: No prevertebral fluid or swelling. No
visible canal hematoma.

Disc levels: Degenerative disc disease with disc height loss at
C3-4, C4-5, C5-6 and C6-7. Broad-based disc osteophyte complex at
C3-4 with severe bilateral facet arthropathy. At C4-5 there is
severe bilateral foraminal stenosis and severe bilateral facet
arthropathy. At C5-6 there is a broad-based disc osteophyte complex,
bilateral uncovertebral degenerative changes, bilateral foraminal
stenosis and bilateral facet arthropathy. At C6-7 there is right
uncovertebral degenerative change and right foraminal stenosis.
Severe the atlantodental arthropathy.

Upper chest: Lung apices are clear.

Other: No fluid collection or hematoma.
IMPRESSION: 1. No acute intracranial pathology.
2. No acute osseous injury of the cervical spine.

## 2019-06-26 DEATH — deceased

## 2019-07-04 ENCOUNTER — Encounter: Payer: Self-pay | Admitting: General Practice
